# Patient Record
Sex: Female | Born: 2000
Health system: Southern US, Community
[De-identification: ages and names within clinical notes are randomized; demographics above are authoritative.]

## PROBLEM LIST (undated history)

## (undated) ENCOUNTER — Ambulatory Visit: Payer: Medicaid Other

## (undated) ENCOUNTER — Ambulatory Visit: Admission: EM

## (undated) DIAGNOSIS — J189 Pneumonia, unspecified organism: Secondary | ICD-10-CM

## (undated) DIAGNOSIS — D649 Anemia, unspecified: Secondary | ICD-10-CM

## (undated) DIAGNOSIS — R000 Tachycardia, unspecified: Secondary | ICD-10-CM

## (undated) DIAGNOSIS — F419 Anxiety disorder, unspecified: Secondary | ICD-10-CM

## (undated) DIAGNOSIS — A6000 Herpesviral infection of urogenital system, unspecified: Secondary | ICD-10-CM

## (undated) DIAGNOSIS — K219 Gastro-esophageal reflux disease without esophagitis: Secondary | ICD-10-CM

## (undated) DIAGNOSIS — Z8759 Personal history of other complications of pregnancy, childbirth and the puerperium: Secondary | ICD-10-CM

## (undated) DIAGNOSIS — F32A Depression, unspecified: Secondary | ICD-10-CM

## (undated) DIAGNOSIS — U071 COVID-19: Secondary | ICD-10-CM

## (undated) DIAGNOSIS — O09299 Supervision of pregnancy with other poor reproductive or obstetric history, unspecified trimester: Secondary | ICD-10-CM

## (undated) HISTORY — DX: Anxiety disorder, unspecified: F41.9

## (undated) HISTORY — DX: Personal history of other complications of pregnancy, childbirth and the puerperium: Z87.59

## (undated) HISTORY — DX: Gastro-esophageal reflux disease without esophagitis: K21.9

## (undated) HISTORY — DX: COVID-19: U07.1

## (undated) HISTORY — DX: Depression, unspecified: F32.A

## (undated) HISTORY — DX: Herpesviral infection of urogenital system, unspecified: A60.00

## (undated) HISTORY — DX: Pneumonia, unspecified organism: J18.9

## (undated) HISTORY — PX: NO PAST SURGERIES: SHX2092

## (undated) HISTORY — DX: Supervision of pregnancy with other poor reproductive or obstetric history, unspecified trimester: O09.299

---
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
## 2020-01-11 ENCOUNTER — Ambulatory Visit: Payer: Self-pay

## 2020-01-16 ENCOUNTER — Ambulatory Visit: Payer: Medicaid Other | Admitting: Obstetrics and Gynecology

## 2020-01-20 ENCOUNTER — Ambulatory Visit
Admission: EM | Admit: 2020-01-20 | Discharge: 2020-01-20 | Disposition: A | Discharge status: Home / Self Care | Payer: Medicaid Other | Attending: Physician Assistant | Admitting: Physician Assistant

## 2020-01-20 ENCOUNTER — Other Ambulatory Visit: Payer: Self-pay

## 2020-01-20 DIAGNOSIS — J3489 Other specified disorders of nose and nasal sinuses: Secondary | ICD-10-CM

## 2020-01-20 DIAGNOSIS — R059 Cough, unspecified: Secondary | ICD-10-CM

## 2020-01-20 DIAGNOSIS — Z1152 Encounter for screening for COVID-19: Secondary | ICD-10-CM

## 2020-01-20 MED ORDER — BENZONATATE 200 MG PO CAPS: 200.0000 mg | ORAL_CAPSULE | Freq: Three times a day (TID) | ORAL | 0 refills | Status: DC

## 2020-01-20 MED ORDER — FLUTICASONE PROPIONATE 50 MCG/ACT NA SUSP: 2.0000 | Freq: Every day | NASAL | 0 refills | Status: DC

## 2020-01-20 NOTE — Discharge Instructions (Signed)
COVID PCR testing ordered. I would like you to quarantine until testing results. Tessalon for cough. Start flonase nasal spray for nasal congestion/drainage. You can use over the counter nasal saline rinse such as neti pot for nasal congestion. Keep hydrated, your urine should be clear to pale yellow in color. Tylenol/motrin for fever and pain. Monitor for any worsening of symptoms, chest pain, shortness of breath, wheezing, swelling of the throat, go to the emergency department for further evaluation needed.  ° ° °

## 2020-01-20 NOTE — ED Provider Notes (Signed)
[facility]    CSN: [id number] Arrival date & time: 01/20/20  1528      History   Chief Complaint Chief Complaint  Patient presents with  . Cough  . Chest Pain    HPI Alexis [name] is a 19 y.o. female.   19 year old female comes in for 1 week history of URI symptoms. Cough, occasional productive cough which is mostly in the morning. Denies rhinorrhea, nasal congestion, sore throat. Denies fever, chills, body aches. Denies abdominal pain, nausea, vomiting, diarrhea. Denies shortness of breath, loss of taste/smell. States now with pain to the right ribs with cough/inspiration. Ibuprofen without relief.   Of note, patient was diagnosed with bronchitis 1 month ago and given course of azithromycin. Symptoms resolved prior to current symptom onset.  Did not need prednisone, albuterol.     Past Medical History:  Diagnosis Date  . GERD (gastroesophageal reflux disease)   . Tachycardia     Patient Active Problem List   Diagnosis Date Noted  . GERD (gastroesophageal reflux disease)     Past Surgical History:  Procedure Laterality Date  . NO PAST SURGERIES      OB History    Gravida  0   Para  0   Term  0   Preterm  0   AB  0   Living  0     SAB  0   TAB  0   Ectopic  0   Multiple  0   Live Births  0            Home Medications    Prior to Admission medications   Medication Sig Start Date End Date Taking? Authorizing Provider  cetirizine (ZYRTEC) 10 MG tablet Take 10 mg by mouth daily. 03/01/19  Yes [provider]  drospirenone-ethinyl estradiol (YAZ) 3-0.02 MG tablet Take 1 tablet by mouth daily. 10/27/19  Yes Copland, Helmut Muster B, PA-C  hydrOXYzine (ATARAX/VISTARIL) 25 MG tablet Take 1 tablet (25 mg total) by mouth 3 (three) times daily as needed for anxiety. 07/03/18  Yes Wieters, Hallie C, PA-C  benzonatate (TESSALON) 200 MG capsule Take 1 capsule (200 mg total) by mouth every 8 (eight) hours. 01/20/20   Cathie Hoops, Shantinique Picazo V, PA-C   fluticasone (FLONASE) 50 MCG/ACT nasal spray Place 2 sprays into both nostrils daily. 01/20/20   Cathie Hoops, Amaar Oshita V, PA-C  omeprazole (PRILOSEC) 40 MG capsule Take 40 mg by mouth daily. 05/11/19 10/20/19  [provider]  pantoprazole (PROTONIX) 40 MG tablet Take 40 mg by mouth daily.  01/20/20  [provider]  sertraline (ZOLOFT) 50 MG tablet Take 50 mg by mouth daily. 11/02/19 01/20/20  [provider]    Family History Family History  Problem Relation Age of Onset  . Diabetes Mellitus II Mother   . Hypertension Mother   . Hypertension Father     Social History Social History   Tobacco Use  . Smoking status: Never Smoker  . Smokeless tobacco: Never Used  Vaping Use  . Vaping Use: Never used  Substance Use Topics  . Alcohol use: Not Currently  . Drug use: Not Currently     Allergies   Dust mite extract   Review of Systems Review of Systems  Reason unable to perform ROS: See HPI as above.     Physical Exam Triage Vital Signs ED Triage Vitals  Enc Vitals Group     BP 01/20/20 1550 135/86     Pulse Rate 01/20/20 1550 99  Resp 01/20/20 1550 18     Temp 01/20/20 1550 98.8 F (37.1 C)     Temp Source 01/20/20 1550 Oral     SpO2 01/20/20 1550 99 %     Weight --      Height --      Head Circumference --      Peak Flow --      Pain Score 01/20/20 1614 4     Pain Loc --      Pain Edu? --      Excl. in GC? --    No data found.  Updated Vital Signs BP 135/86 (BP Location: Left Arm)   Pulse 99   Temp 98.8 F (37.1 C) (Oral)   Resp 18   LMP 01/03/2020   SpO2 99%   Physical Exam Constitutional:      General: She is not in acute distress.    Appearance: Normal appearance. She is well-developed. She is not ill-appearing, toxic-appearing or diaphoretic.  HENT:     Head: Normocephalic and atraumatic.     Right Ear: Tympanic membrane, ear canal and external ear normal. Tympanic membrane is not erythematous or bulging.     Left Ear: Tympanic  membrane, ear canal and external ear normal. Tympanic membrane is not erythematous or bulging.     Nose:     Right Sinus: Maxillary sinus tenderness present. No frontal sinus tenderness.     Left Sinus: Maxillary sinus tenderness present. No frontal sinus tenderness.     Mouth/Throat:     Mouth: Mucous membranes are moist.     Pharynx: Oropharynx is clear. Uvula midline.  Eyes:     Conjunctiva/sclera: Conjunctivae normal.     Pupils: Pupils are equal, round, and reactive to light.  Cardiovascular:     Rate and Rhythm: Normal rate and regular rhythm.  Pulmonary:     Effort: Pulmonary effort is normal. No accessory muscle usage, prolonged expiration, respiratory distress or retractions.     Breath sounds: No decreased air movement or transmitted upper airway sounds. No decreased breath sounds.     Comments: LCTAB Chest:     Chest wall: No tenderness.  Musculoskeletal:     Cervical back: Normal range of motion and neck supple.  Skin:    General: Skin is warm and dry.  Neurological:     Mental Status: She is alert and oriented to person, place, and time.      UC Treatments / Results  Labs (all labs ordered are listed, but only abnormal results are displayed) Labs Reviewed  NOVEL CORONAVIRUS, NAA    EKG   Radiology No results found.  Procedures Procedures (including critical care time)  Medications Ordered in UC Medications - No data to display  Initial Impression / Assessment and Plan / UC Course  I have reviewed the triage vital signs and the nursing notes.  Pertinent labs & imaging results that were available during my care of the patient were reviewed by me and considered in my medical decision making (see chart for details).    Patient afebrile, nontoxic.  Lungs clear to auscultation bilaterally without adventitious lung sounds.  And no indications for chest x-ray at this time.  Covid testing ordered.  Will treat symptomatically.  Return precautions given.   Patient expresses understanding and agrees to plan.  If symptoms not improving with symptomatic management, to consider prednisone/doxycycline.  Final Clinical Impressions(s) / UC Diagnoses   Final diagnoses:  Encounter for screening for COVID-19  Cough  Sinus pressure   ED Prescriptions    Medication Sig Dispense Auth. Provider   benzonatate (TESSALON) 200 MG capsule Take 1 capsule (200 mg total) by mouth every 8 (eight) hours. 21 capsule Niomie Englert V, PA-C   fluticasone (FLONASE) 50 MCG/ACT nasal spray Place 2 sprays into both nostrils daily. 1 g Belinda Fisher, PA-C     PDMP not reviewed this encounter.   Belinda Fisher, PA-C 01/20/20 1641

## 2020-01-20 NOTE — ED Triage Notes (Signed)
Pt c/o productive cough with yellow sputum for approx 1 week. Pt c/o right sided rib/chest pain today that is exacerbated with moving, inspiration.  Denies SOB, sore throat, fever, chills, n/v/d. Denies OTC meds for symptoms Pt states she was evaluated and dx for bronchitis approx 1 month ago and given antibiotic. Bilat lungs CTA

## 2020-01-22 LAB — SARS-COV-2, NAA 2 DAY TAT

## 2020-01-22 LAB — NOVEL CORONAVIRUS, NAA: SARS-CoV-2, NAA: NOT DETECTED

## 2020-02-09 ENCOUNTER — Other Ambulatory Visit: Payer: Self-pay

## 2020-02-09 ENCOUNTER — Ambulatory Visit (INDEPENDENT_AMBULATORY_CARE_PROVIDER_SITE_OTHER): Payer: Medicaid Other | Admitting: Obstetrics and Gynecology

## 2020-02-09 ENCOUNTER — Other Ambulatory Visit (HOSPITAL_COMMUNITY)
Admission: RE | Admit: 2020-02-09 | Discharge: 2020-02-09 | Disposition: A | Discharge status: Home / Self Care | Payer: Medicaid Other | Source: Ambulatory Visit | Attending: Obstetrics and Gynecology | Admitting: Obstetrics and Gynecology

## 2020-02-09 ENCOUNTER — Encounter: Payer: Self-pay | Admitting: Obstetrics and Gynecology

## 2020-02-09 VITALS — BP 120/70 | HR 89 | Resp 16 | Ht 65.0 in | Wt 143.6 lb

## 2020-02-09 DIAGNOSIS — Z113 Encounter for screening for infections with a predominantly sexual mode of transmission: Secondary | ICD-10-CM | POA: Diagnosis not present

## 2020-02-09 DIAGNOSIS — B9689 Other specified bacterial agents as the cause of diseases classified elsewhere: Secondary | ICD-10-CM | POA: Insufficient documentation

## 2020-02-09 DIAGNOSIS — N760 Acute vaginitis: Secondary | ICD-10-CM | POA: Insufficient documentation

## 2020-02-09 LAB — POCT WET PREP WITH KOH
KOH Prep POC: NEGATIVE
Trichomonas, UA: NEGATIVE
Yeast Wet Prep HPF POC: NEGATIVE

## 2020-02-09 MED ORDER — CLINDAMYCIN HCL 300 MG PO CAPS: 300.0000 mg | ORAL_CAPSULE | Freq: Two times a day (BID) | ORAL | 0 refills | Status: AC

## 2020-02-09 NOTE — Patient Instructions (Signed)
I value your feedback and entrusting us with your care. If you get a [facility] patient survey, I would appreciate you taking the time to let us know about your experience today. Thank you!  As of May 26, 2019, your lab results will be released to your MyChart immediately, before I even have a chance to see them. Please give me time to review them and contact you if there are any abnormalities. Thank you for your patience.  

## 2020-02-09 NOTE — Progress Notes (Signed)
Alexis Downs, MD   Chief Complaint  Patient presents with  . Gynecologic Exam    STD screening  ( discharge and vaginal bump x 1 week )    HPI:      Alexis [name] is a 19 y.o. G0P0000 whose LMP was Patient's last menstrual period was 01/31/2020., presents today for increased d/c with odor, irritation this wk. Feels like BV sx from 7/21, confirmed on culture, treated with clindamycin. Pt uses scented soap, dryer sheets, and is taking probiotics. Is not using condoms, no new partners. Would like STD testing just to be safe. Neg STD testing 7/21 Also noticed a vaginal bump RT labia majora this wk. Sx occur after shaving.    Past Medical History:  Diagnosis Date  . GERD (gastroesophageal reflux disease)   . Tachycardia     Past Surgical History:  Procedure Laterality Date  . NO PAST SURGERIES      Family History  Problem Relation Age of Onset  . Diabetes Mellitus II Mother   . Hypertension Mother   . Hypertension Father     Social History   Socioeconomic History  . Marital status: Unknown    Spouse name: Not on file  . Number of children: Not on file  . Years of education: Not on file  . Highest education level: Not on file  Occupational History  . Not on file  Tobacco Use  . Smoking status: Never Smoker  . Smokeless tobacco: Never Used  Vaping Use  . Vaping Use: Never used  Substance and Sexual Activity  . Alcohol use: Not Currently  . Drug use: Not Currently  . Sexual activity: Yes    Birth control/protection: Pill  Other Topics Concern  . Not on file  Social History Narrative  . Not on file   Social Determinants of Health   Financial Resource Strain:   . Difficulty of Paying Living Expenses: Not on file  Food Insecurity:   . Worried About Programme researcher, broadcasting/film/video in the Last Year: Not on file  . Ran Out of Food in the Last Year: Not on file  Transportation Needs:   . Lack of Transportation (Medical): Not on file  . Lack of Transportation  (Non-Medical): Not on file  Physical Activity:   . Days of Exercise per Week: Not on file  . Minutes of Exercise per Session: Not on file  Stress:   . Feeling of Stress : Not on file  Social Connections:   . Frequency of Communication with Friends and Family: Not on file  . Frequency of Social Gatherings with Friends and Family: Not on file  . Attends Religious Services: Not on file  . Active Member of Clubs or Organizations: Not on file  . Attends Banker Meetings: Not on file  . Marital Status: Not on file  Intimate Partner Violence:   . Fear of Current or Ex-Partner: Not on file  . Emotionally Abused: Not on file  . Physically Abused: Not on file  . Sexually Abused: Not on file    Outpatient Medications Prior to Visit  Medication Sig Dispense Refill  . cetirizine (ZYRTEC) 10 MG tablet Take 10 mg by mouth daily.    . fluticasone (FLONASE) 50 MCG/ACT nasal spray Place 2 sprays into both nostrils daily. 1 g 0  . hydrOXYzine (ATARAX/VISTARIL) 25 MG tablet Take 1 tablet (25 mg total) by mouth 3 (three) times daily as needed for anxiety. 30 tablet 0  .  benzonatate (TESSALON) 200 MG capsule Take 1 capsule (200 mg total) by mouth every 8 (eight) hours. 21 capsule 0  . drospirenone-ethinyl estradiol (YAZ) 3-0.02 MG tablet Take 1 tablet by mouth daily. 3 Package 2   No facility-administered medications prior to visit.      ROS:  Review of Systems  Constitutional: Negative for fever.  Gastrointestinal: Negative for blood in stool, constipation, diarrhea, nausea and vomiting.  Genitourinary: Positive for vaginal discharge. Negative for dyspareunia, dysuria, flank pain, frequency, hematuria, urgency, vaginal bleeding and vaginal pain.  Musculoskeletal: Negative for back pain.  Skin: Negative for rash.  BREAST: No symptoms   OBJECTIVE:   Vitals:  BP 120/70   Pulse 89   Resp 16   Ht 5\' 5"  (1.651 m)   Wt 143 lb 9.6 oz (65.1 kg)   LMP 01/31/2020   SpO2 99%   BMI  23.90 kg/m   Physical Exam Vitals reviewed.  Constitutional:      Appearance: She is well-developed.  Pulmonary:     Effort: Pulmonary effort is normal.  Genitourinary:    General: Normal vulva.     Pubic Area: No rash.      Labia:        Right: Lesion present. No rash or tenderness.        Left: No rash, tenderness or lesion.      Vagina: Vaginal discharge present. No erythema or tenderness.     Cervix: Normal.     Uterus: Normal. Not enlarged and not tender.      Adnexa: Right adnexa normal and left adnexa normal.       Right: No mass or tenderness.         Left: No mass or tenderness.      Musculoskeletal:        General: Normal range of motion.     Cervical back: Normal range of motion.  Skin:    General: Skin is warm and dry.  Neurological:     General: No focal deficit present.     Mental Status: She is alert and oriented to person, place, and time.  Psychiatric:        Mood and Affect: Mood normal.        Behavior: Behavior normal.        Thought Content: Thought content normal.        Judgment: Judgment normal.     Results: Results for orders placed or performed in visit on 02/09/20 (from the past 24 hour(s))  POCT Wet Prep with KOH     Status: Normal   Collection Time: 02/09/20 12:00 PM  Result Value Ref Range   Trichomonas, UA Negative    Clue Cells Wet Prep HPF POC few    Epithelial Wet Prep HPF POC     Yeast Wet Prep HPF POC neg    Bacteria Wet Prep HPF POC     RBC Wet Prep HPF POC     WBC Wet Prep HPF POC     KOH Prep POC Negative Negative     Assessment/Plan: Bacterial vaginosis - Plan: clindamycin (CLEOCIN) 300 MG capsule, Cervicovaginal ancillary only, POCT Wet Prep with KOH; Pos sx and exam. Rx clindamycin. Dove sens skin soap, line dry underwear, condoms. May need to add boric acid supp. Cont probiotics.  Screening for STD (sexually transmitted disease) - Plan: Cervicovaginal ancillary only    Meds ordered this encounter  Medications    . clindamycin (CLEOCIN) 300 MG capsule  Sig: Take 1 capsule (300 mg total) by mouth 2 (two) times daily for 7 days.    Dispense:  14 capsule    Refill:  0    Order Specific Question:   Supervising Provider    Answer:   Nadara Mustard [[id number]]      Return if symptoms worsen or fail to improve.  Arturo Freundlich B. Estrella Alcaraz, PA-C 02/09/2020 12:01 PM

## 2020-02-13 LAB — CERVICOVAGINAL ANCILLARY ONLY
Bacterial Vaginitis (gardnerella): NEGATIVE
Chlamydia: NEGATIVE
Comment: NEGATIVE
Comment: NEGATIVE
Comment: NEGATIVE
Comment: NORMAL
Neisseria Gonorrhea: NEGATIVE
Trichomonas: NEGATIVE

## 2020-02-14 MED ORDER — FLUCONAZOLE 150 MG PO TABS: 150.0000 mg | ORAL_TABLET | Freq: Once | ORAL | 0 refills | Status: AC

## 2020-02-14 NOTE — Addendum Note (Signed)
Addended by: Althea Grimmer B on: 02/14/2020 09:43 AM   Modules accepted: Orders

## 2020-02-17 ENCOUNTER — Ambulatory Visit
Admission: EM | Admit: 2020-02-17 | Discharge: 2020-02-17 | Disposition: A | Discharge status: Home / Self Care | Payer: Medicaid Other | Attending: Physician Assistant | Admitting: Physician Assistant

## 2020-02-17 ENCOUNTER — Other Ambulatory Visit: Payer: Self-pay

## 2020-02-17 DIAGNOSIS — J029 Acute pharyngitis, unspecified: Secondary | ICD-10-CM | POA: Diagnosis present

## 2020-02-17 DIAGNOSIS — R05 Cough: Secondary | ICD-10-CM | POA: Diagnosis present

## 2020-02-17 DIAGNOSIS — R0981 Nasal congestion: Secondary | ICD-10-CM | POA: Diagnosis present

## 2020-02-17 DIAGNOSIS — Z1152 Encounter for screening for COVID-19: Secondary | ICD-10-CM | POA: Insufficient documentation

## 2020-02-17 DIAGNOSIS — R059 Cough, unspecified: Secondary | ICD-10-CM

## 2020-02-17 LAB — POCT RAPID STREP A (OFFICE): Rapid Strep A Screen: NEGATIVE

## 2020-02-17 NOTE — ED Provider Notes (Signed)
[facility]    CSN: [id number] Arrival date & time: 02/17/20  1043      History   Chief Complaint Chief Complaint  Patient presents with  . Sore Throat  . Fever    HPI Alexis [name] is a 19 y.o. female.   19 year old female comes in for 2 day of URI symptoms. Sore throat, nasal congestion, rhinorrhea, cough, nausea, body aches. Fever, tmax 102.9, responsive to antipyretic. Denies abdominal pain, nausea, vomiting, diarrhea. Denies shortness of breath, loss of taste/smell.      Past Medical History:  Diagnosis Date  . GERD (gastroesophageal reflux disease)   . Tachycardia     Patient Active Problem List   Diagnosis Date Noted  . GERD (gastroesophageal reflux disease)     Past Surgical History:  Procedure Laterality Date  . NO PAST SURGERIES      OB History    Gravida  0   Para  0   Term  0   Preterm  0   AB  0   Living  0     SAB  0   TAB  0   Ectopic  0   Multiple  0   Live Births  0            Home Medications    Prior to Admission medications   Medication Sig Start Date End Date Taking? Authorizing Provider  cetirizine (ZYRTEC) 10 MG tablet Take 10 mg by mouth daily. 03/01/19  Yes [provider]  fluticasone (FLONASE) 50 MCG/ACT nasal spray Place 2 sprays into both nostrils daily. 01/20/20  Yes Dula Havlik V, PA-C  pantoprazole (PROTONIX) 40 MG tablet Take 40 mg by mouth daily.   Yes [provider]  hydrOXYzine (ATARAX/VISTARIL) 25 MG tablet Take 1 tablet (25 mg total) by mouth 3 (three) times daily as needed for anxiety. 07/03/18   Wieters, Hallie C, PA-C  omeprazole (PRILOSEC) 40 MG capsule Take 40 mg by mouth daily. 05/11/19 10/20/19  [provider]  sertraline (ZOLOFT) 50 MG tablet Take 50 mg by mouth daily. 11/02/19 01/20/20  [provider]    Family History Family History  Problem Relation Age of Onset  . Diabetes Mellitus II Mother   . Hypertension Mother   . Hypertension Father      Social History Social History   Tobacco Use  . Smoking status: Never Smoker  . Smokeless tobacco: Never Used  Vaping Use  . Vaping Use: Never used  Substance Use Topics  . Alcohol use: Not Currently  . Drug use: Not Currently     Allergies   Dust mite extract   Review of Systems Review of Systems  Reason unable to perform ROS: See HPI as above.     Physical Exam Triage Vital Signs ED Triage Vitals  Enc Vitals Group     BP 02/17/20 1248 128/87     Pulse Rate 02/17/20 1248 (!) 113     Resp 02/17/20 1248 16     Temp 02/17/20 1248 98.9 F (37.2 C)     Temp src --      SpO2 02/17/20 1248 99 %     Weight --      Height --      Head Circumference --      Peak Flow --      Pain Score 02/17/20 1320 7     Pain Loc --      Pain Edu? --  Excl. in GC? --    No data found.  Updated Vital Signs BP 128/87 (BP Location: Left Arm)   Pulse (!) 113   Temp 98.9 F (37.2 C) Comment: ibuprophin x 2 hours ago  Resp 16   LMP 01/31/2020   SpO2 99%   Physical Exam Constitutional:      General: She is not in acute distress.    Appearance: Normal appearance. She is well-developed. She is not ill-appearing, toxic-appearing or diaphoretic.  HENT:     Head: Normocephalic and atraumatic.     Right Ear: Tympanic membrane, ear canal and external ear normal. Tympanic membrane is not erythematous or bulging.     Left Ear: Tympanic membrane, ear canal and external ear normal. Tympanic membrane is not erythematous or bulging.     Nose:     Right Sinus: No maxillary sinus tenderness or frontal sinus tenderness.     Left Sinus: No maxillary sinus tenderness or frontal sinus tenderness.     Mouth/Throat:     Mouth: Mucous membranes are moist.     Pharynx: Oropharynx is clear. Uvula midline.  Eyes:     Conjunctiva/sclera: Conjunctivae normal.     Pupils: Pupils are equal, round, and reactive to light.  Cardiovascular:     Rate and Rhythm: Regular rhythm. Tachycardia present.   Pulmonary:     Effort: Pulmonary effort is normal. No accessory muscle usage, prolonged expiration, respiratory distress or retractions.     Breath sounds: No decreased air movement or transmitted upper airway sounds. No decreased breath sounds.     Comments: LCTAB Musculoskeletal:     Cervical back: Normal range of motion and neck supple.  Skin:    General: Skin is warm and dry.  Neurological:     Mental Status: She is alert and oriented to person, place, and time.      UC Treatments / Results  Labs (all labs ordered are listed, but only abnormal results are displayed) Labs Reviewed  NOVEL CORONAVIRUS, NAA  CULTURE, GROUP A STREP [facility])  POCT RAPID STREP A (OFFICE)    EKG   Radiology No results found.  Procedures Procedures (including critical care time)  Medications Ordered in UC Medications - No data to display  Initial Impression / Assessment and Plan / UC Course  I have reviewed the triage vital signs and the nursing notes.  Pertinent labs & imaging results that were available during my care of the patient were reviewed by me and considered in my medical decision making (see chart for details).    Rapid strep negative. COVID PCR test ordered. Patient to quarantine until testing results return. No alarming signs on exam. LCTAB. Symptomatic treatment discussed.  Push fluids.  Return precautions given.  Patient expresses understanding and agrees to plan.  Final Clinical Impressions(s) / UC Diagnoses   Final diagnoses:  Encounter for screening for COVID-19  Cough  Sore throat  Nasal congestion   ED Prescriptions    None     PDMP not reviewed this encounter.   Belinda Fisher, PA-C 02/17/20 1344

## 2020-02-17 NOTE — ED Triage Notes (Signed)
Pt c/o sore throat, congestion, runny nose, nausea, body aches, cough, fever, right ear pain onset approx 2 days ago. Reports Tmax of 102.9 this morning. Took 600mg  ibuprofen at approx 0930. Took alka seltzer cold last night.   Denies v/d, abdominal pain, SOB,loss of taste/smell.

## 2020-02-17 NOTE — Discharge Instructions (Signed)
Rapid strep negative. COVID PCR testing ordered. I would like you to quarantine until testing results. You can take over the counter flonase/nasacort to help with nasal congestion/drainage. Tylenol/motrin for pain and fever. Keep hydrated, urine should be clear to pale yellow in color. If experiencing shortness of breath, trouble breathing, go to the emergency department for further evaluation needed.   For sore throat/cough try using a honey-based tea. Use 3 teaspoons of honey with juice squeezed from half lemon. Place shaved pieces of ginger into 1/2-1 cup of water and warm over stove top. Then mix the ingredients and repeat every 4 hours as needed.

## 2020-02-18 LAB — NOVEL CORONAVIRUS, NAA: SARS-CoV-2, NAA: NOT DETECTED

## 2020-02-19 LAB — CULTURE, GROUP A STREP (THRC)

## 2020-03-13 ENCOUNTER — Ambulatory Visit: Payer: Medicaid Other | Admitting: Obstetrics and Gynecology

## 2020-03-14 NOTE — Progress Notes (Signed)
Patient, No Pcp Per   Chief Complaint  Patient presents with  . Vaginal Discharge    thick, itchiness, sour odor x 1 week    HPI:      [name] is a 19 y.o. G0P0000 whose LMP was Patient's last menstrual period was 02/22/2020 (exact date)., presents today for increased vag d/c with irritation and sour odor for the past wk. Treated with monistat-7 with sx relief except still has sour odor. Hx of BV 8/21, treated with clindamycin with sx relief. Taking probiotics, no dryer sheets, using dove sens skin soap. She is sex active, no new partners. Neg STD tesitng 8/21. No urin sx, pelvic pain, fevers.  She would like to restart OCPs. Has done sprintec with rash, loestrin with longer periods and yaz with mood changes. Not on OCPs currently. Also considering depo.  Past Medical History:  Diagnosis Date  . GERD (gastroesophageal reflux disease)   . Tachycardia     Past Surgical History:  Procedure Laterality Date  . NO PAST SURGERIES      Family History  Problem Relation Age of Onset  . Diabetes Mellitus II Mother   . Hypertension Mother   . Hypertension Father     Social History   Socioeconomic History  . Marital status: Unknown    Spouse name: Not on file  . Number of children: Not on file  . Years of education: Not on file  . Highest education level: Not on file  Occupational History  . Not on file  Tobacco Use  . Smoking status: Never Smoker  . Smokeless tobacco: Never Used  Vaping Use  . Vaping Use: Never used  Substance and Sexual Activity  . Alcohol use: Not Currently  . Drug use: Not Currently  . Sexual activity: Yes    Birth control/protection: None, Condom  Other Topics Concern  . Not on file  Social History Narrative  . Not on file   Social Determinants of Health   Financial Resource Strain:   . Difficulty of Paying Living Expenses: Not on file  Food Insecurity:   . Worried About Programme researcher, broadcasting/film/video in the Last Year: Not on file  .  Ran Out of Food in the Last Year: Not on file  Transportation Needs:   . Lack of Transportation (Medical): Not on file  . Lack of Transportation (Non-Medical): Not on file  Physical Activity:   . Days of Exercise per Week: Not on file  . Minutes of Exercise per Session: Not on file  Stress:   . Feeling of Stress : Not on file  Social Connections:   . Frequency of Communication with Friends and Family: Not on file  . Frequency of Social Gatherings with Friends and Family: Not on file  . Attends Religious Services: Not on file  . Active Member of Clubs or Organizations: Not on file  . Attends Banker Meetings: Not on file  . Marital Status: Not on file  Intimate Partner Violence:   . Fear of Current or Ex-Partner: Not on file  . Emotionally Abused: Not on file  . Physically Abused: Not on file  . Sexually Abused: Not on file    Outpatient Medications Prior to Visit  Medication Sig Dispense Refill  . cetirizine (ZYRTEC) 10 MG tablet Take 10 mg by mouth daily.    . hydrOXYzine (ATARAX/VISTARIL) 25 MG tablet Take 1 tablet (25 mg total) by mouth 3 (three) times daily as needed for anxiety.  30 tablet 0  . pantoprazole (PROTONIX) 40 MG tablet Take 40 mg by mouth daily.    . fluticasone (FLONASE) 50 MCG/ACT nasal spray Place 2 sprays into both nostrils daily. 1 g 0   No facility-administered medications prior to visit.      ROS:  Review of Systems  Constitutional: Negative for fever.  Gastrointestinal: Negative for blood in stool, constipation, diarrhea, nausea and vomiting.  Genitourinary: Negative for dyspareunia, dysuria, flank pain, frequency, hematuria, urgency, vaginal bleeding, vaginal discharge and vaginal pain.  Musculoskeletal: Negative for back pain.  Skin: Negative for rash.  BREAST: No symptoms   OBJECTIVE:   Vitals:  BP 110/70   Ht 5\' 5"  (1.651 m)   Wt 144 lb (65.3 kg)   LMP 02/22/2020 (Exact Date)   BMI 23.96 kg/m   Physical Exam Vitals  reviewed.  Constitutional:      Appearance: She is well-developed.  Pulmonary:     Effort: Pulmonary effort is normal.  Genitourinary:    General: Normal vulva.     Pubic Area: No rash.      Labia:        Right: No rash, tenderness or lesion.        Left: No rash, tenderness or lesion.      Vagina: Normal. No vaginal discharge, erythema or tenderness.     Cervix: Normal.     Uterus: Normal. Not enlarged and not tender.      Adnexa: Right adnexa normal and left adnexa normal.       Right: No mass or tenderness.         Left: No mass or tenderness.    Musculoskeletal:        General: Normal range of motion.     Cervical back: Normal range of motion.  Skin:    General: Skin is warm and dry.  Neurological:     General: No focal deficit present.     Mental Status: She is alert and oriented to person, place, and time.  Psychiatric:        Mood and Affect: Mood normal.        Behavior: Behavior normal.        Thought Content: Thought content normal.        Judgment: Judgment normal.     Results: Results for orders placed or performed in visit on 03/15/20 (from the past 24 hour(s))  POCT Wet Prep with KOH     Status: Normal   Collection Time: 03/15/20 10:40 AM  Result Value Ref Range   Trichomonas, UA Negative    Clue Cells Wet Prep HPF POC neg    Epithelial Wet Prep HPF POC     Yeast Wet Prep HPF POC neg    Bacteria Wet Prep HPF POC     RBC Wet Prep HPF POC     WBC Wet Prep HPF POC     KOH Prep POC Negative Negative     Assessment/Plan: Vaginal odor - Plan: fluconazole (DIFLUCAN) 150 MG tablet, POCT Wet Prep with KOH; Neg wet prep and sx for BV, neg wet prep. Treat empirically with diflucan in case any residual yeast. Reassurance. F/u prn.   Encounter for initial prescription of contraceptive pills - Plan: levonorgestrel-ethinyl estradiol (AVIANE) 0.1-20 MG-MCG tablet; try Aviane. Start with next menses. Condoms. F/u prn side effects. Depo also discussed.    Meds  ordered this encounter  Medications  . fluconazole (DIFLUCAN) 150 MG tablet    Sig: Take 1 tablet (  150 mg total) by mouth once for 1 dose.    Dispense:  1 tablet    Refill:  0    Order Specific Question:   Supervising Provider    Answer:   Nadara Mustard [id number]  . levonorgestrel-ethinyl estradiol (AVIANE) 0.1-20 MG-MCG tablet    Sig: Take 1 tablet by mouth daily.    Dispense:  84 tablet    Refill:  0    Order Specific Question:   Supervising Provider    Answer:   Nadara Mustard [[id number]]      Return if symptoms worsen or fail to improve.  Alexis [name] B. Dawnell Bryant, PA-C 03/15/2020 10:42 AM

## 2020-03-15 ENCOUNTER — Encounter: Payer: Self-pay | Admitting: Obstetrics and Gynecology

## 2020-03-15 ENCOUNTER — Ambulatory Visit (INDEPENDENT_AMBULATORY_CARE_PROVIDER_SITE_OTHER): Payer: Medicaid Other | Admitting: Obstetrics and Gynecology

## 2020-03-15 ENCOUNTER — Other Ambulatory Visit: Payer: Self-pay

## 2020-03-15 VITALS — BP 110/70 | Ht 65.0 in | Wt 144.0 lb

## 2020-03-15 DIAGNOSIS — Z30011 Encounter for initial prescription of contraceptive pills: Secondary | ICD-10-CM

## 2020-03-15 DIAGNOSIS — N898 Other specified noninflammatory disorders of vagina: Secondary | ICD-10-CM | POA: Diagnosis not present

## 2020-03-15 LAB — POCT WET PREP WITH KOH
Clue Cells Wet Prep HPF POC: NEGATIVE
KOH Prep POC: NEGATIVE
Trichomonas, UA: NEGATIVE
Yeast Wet Prep HPF POC: NEGATIVE

## 2020-03-15 MED ORDER — LEVONORGESTREL-ETHINYL ESTRAD 0.1-20 MG-MCG PO TABS: 1.0000 | ORAL_TABLET | Freq: Every day | ORAL | 0 refills | Status: DC

## 2020-03-15 MED ORDER — FLUCONAZOLE 150 MG PO TABS: 150.0000 mg | ORAL_TABLET | Freq: Once | ORAL | 0 refills | Status: AC

## 2020-03-15 NOTE — Patient Instructions (Signed)
I value your feedback and entrusting us with your care. If you get a [facility] patient survey, I would appreciate you taking the time to let us know about your experience today. Thank you!  As of May 26, 2019, your lab results will be released to your MyChart immediately, before I even have a chance to see them. Please give me time to review them and contact you if there are any abnormalities. Thank you for your patience.  

## 2020-03-28 ENCOUNTER — Encounter: Payer: Self-pay | Admitting: Advanced Practice Midwife

## 2020-03-28 ENCOUNTER — Ambulatory Visit (INDEPENDENT_AMBULATORY_CARE_PROVIDER_SITE_OTHER): Payer: Medicaid Other | Admitting: Advanced Practice Midwife

## 2020-03-28 ENCOUNTER — Other Ambulatory Visit (HOSPITAL_COMMUNITY)
Admission: RE | Admit: 2020-03-28 | Discharge: 2020-03-28 | Disposition: A | Discharge status: Home / Self Care | Payer: Medicaid Other | Source: Ambulatory Visit | Attending: Advanced Practice Midwife | Admitting: Advanced Practice Midwife

## 2020-03-28 ENCOUNTER — Other Ambulatory Visit: Payer: Self-pay

## 2020-03-28 VITALS — BP 129/76 | HR 98 | Wt 142.0 lb

## 2020-03-28 DIAGNOSIS — Z113 Encounter for screening for infections with a predominantly sexual mode of transmission: Secondary | ICD-10-CM | POA: Insufficient documentation

## 2020-03-28 DIAGNOSIS — N912 Amenorrhea, unspecified: Secondary | ICD-10-CM

## 2020-03-28 DIAGNOSIS — N898 Other specified noninflammatory disorders of vagina: Secondary | ICD-10-CM

## 2020-03-28 DIAGNOSIS — R102 Pelvic and perineal pain: Secondary | ICD-10-CM

## 2020-03-28 DIAGNOSIS — R350 Frequency of micturition: Secondary | ICD-10-CM

## 2020-03-28 LAB — POCT URINALYSIS DIPSTICK
Glucose, UA: NEGATIVE
Leukocytes, UA: NEGATIVE
Protein, UA: NEGATIVE
Spec Grav, UA: 1.015 (ref 1.010–1.025)
Urobilinogen, UA: NEGATIVE E.U./dL — AB
pH, UA: 6.5 (ref 5.0–8.0)

## 2020-03-28 NOTE — Progress Notes (Signed)
Lower back pain and at night travels up the side feels like a pulling/cramp, lower abdominal cramping, white discharge no odor, frequent urination, after urinating cramping.

## 2020-03-28 NOTE — Progress Notes (Signed)
Patient ID: Alexis [name], female   DOB: 2000/08/22, 19 y.o.   MRN: [id number]  Reason for Consult: pelvic pain, back pain, vaginal discharge, urinary frequency/urgency    Subjective:  HPI:   Alexis [name] is a 19 y.o. female who had a positive home pregnancy test 5 days ago. Since then she has had severe pelvic cramping and bilateral lower abdomen and bilateral low back pain that radiates up and down. She describes the pain as constant and cramping. She denies any recent trauma. She denies bleeding. She mentions an increase in vaginal discharge that is white and without odor. She has had urinary frequency and urgency. She has tried a heating pad for the pain. She has not taken any pain medicine. She is concerned about a possible ectopic pregnancy and a possible UTI. She does not have concerns for STDs.  She is scheduled for a NOB visit soon. We discussed doing STD testing today since we are already doing vaginitis swab. She agrees.   Past Medical History:  Diagnosis Date  . GERD (gastroesophageal reflux disease)   . Tachycardia    Family History  Problem Relation Age of Onset  . Diabetes Mellitus II Mother   . Hypertension Mother   . Hypertension Father   . Stomach cancer Maternal Aunt   . Stomach cancer Paternal Grandmother    Past Surgical History:  Procedure Laterality Date  . NO PAST SURGERIES      Short Social History:  Social History   Tobacco Use  . Smoking status: Never Smoker  . Smokeless tobacco: Never Used  Substance Use Topics  . Alcohol use: Not Currently    Allergies  Allergen Reactions  . Dust Mite Extract Hives    No current outpatient medications on file.   No current facility-administered medications for this visit.    Review of Systems  Constitutional: Negative for chills and fever.  HENT: Negative for congestion, ear discharge, ear pain, hearing loss, sinus pain and sore throat.   Eyes: Negative for blurred vision and double vision.   Respiratory: Negative for cough, shortness of breath and wheezing.   Cardiovascular: Negative for chest pain, palpitations and leg swelling.  Gastrointestinal: Negative for abdominal pain, blood in stool, constipation, diarrhea, heartburn, melena, nausea and vomiting.  Genitourinary: Negative for dysuria, flank pain, frequency, hematuria and urgency.       Positive for pelvic pain, vaginal discharge  Musculoskeletal: Positive for back pain. Negative for joint pain and myalgias.  Skin: Negative for itching and rash.  Neurological: Negative for dizziness, tingling, tremors, sensory change, speech change, focal weakness, seizures, loss of consciousness, weakness and headaches.  Endo/Heme/Allergies: Negative for environmental allergies. Does not bruise/bleed easily.  Psychiatric/Behavioral: Negative for depression, hallucinations, memory loss, substance abuse and suicidal ideas. The patient is not nervous/anxious and does not have insomnia.         Objective:  Objective   Vitals:   03/28/20 1351  BP: 129/76  Pulse: 98  Weight: 142 lb (64.4 kg)   Body mass index is 23.63 kg/m. Constitutional: Well nourished, well developed female in no acute distress.  HEENT: normal Skin: Warm and dry.  Cardiovascular: Regular rate and rhythm.   Extremity: no edema   Respiratory: Clear to auscultation bilateral. Normal respiratory effort Abdomen: mildly tender to palpation Back: no CVAT Neuro: DTRs 2+, Cranial nerves grossly intact Psych: Alert and Oriented x3. No memory deficits. Normal mood and affect.    Pelvic exam:  is not limited by body habitus EGBUS:  within normal limits Vagina: within normal limits and with normal mucosa, scant white discharge on swab Cervix: not evaluated   Assessment/Plan:     19 y.o. G0 P0 with positive home pregnancy test, pelvic pain, back pain   Trans vaginal ultrasound to r/o ectopic pregnancy and f/u after Urine culture Aptima: STD/vaginitis Return  to clinic for scheduled NOB visit   Tresea Mall CNM [facility] 03/28/2020, 4:08 PM

## 2020-03-29 ENCOUNTER — Other Ambulatory Visit (INDEPENDENT_AMBULATORY_CARE_PROVIDER_SITE_OTHER): Payer: Medicaid Other

## 2020-03-29 ENCOUNTER — Other Ambulatory Visit: Payer: Self-pay | Admitting: Advanced Practice Midwife

## 2020-03-29 DIAGNOSIS — Z3491 Encounter for supervision of normal pregnancy, unspecified, first trimester: Secondary | ICD-10-CM

## 2020-03-29 DIAGNOSIS — R102 Pelvic and perineal pain: Secondary | ICD-10-CM

## 2020-03-30 LAB — CERVICOVAGINAL ANCILLARY ONLY
Bacterial Vaginitis (gardnerella): NEGATIVE
Candida Glabrata: NEGATIVE
Candida Vaginitis: NEGATIVE
Chlamydia: NEGATIVE
Comment: NEGATIVE
Comment: NEGATIVE
Comment: NEGATIVE
Comment: NEGATIVE
Comment: NEGATIVE
Comment: NORMAL
Neisseria Gonorrhea: NEGATIVE
Trichomonas: NEGATIVE

## 2020-03-31 LAB — URINE CULTURE

## 2020-04-02 ENCOUNTER — Telehealth: Payer: Self-pay

## 2020-04-02 ENCOUNTER — Other Ambulatory Visit: Payer: Self-pay | Admitting: Advanced Practice Midwife

## 2020-04-02 DIAGNOSIS — O219 Vomiting of pregnancy, unspecified: Secondary | ICD-10-CM

## 2020-04-02 MED ORDER — PROMETHAZINE HCL 12.5 MG RE SUPP: 12.5000 mg | Freq: Four times a day (QID) | RECTAL | 1 refills | Status: DC | PRN

## 2020-04-02 NOTE — Telephone Encounter (Signed)
Just sent phenergan suppository Rx

## 2020-04-02 NOTE — Telephone Encounter (Signed)
Pt aware.

## 2020-04-02 NOTE — Progress Notes (Signed)
Rx phenergan suppository sent for n/v.

## 2020-04-02 NOTE — Telephone Encounter (Signed)
Pt requesting an RX for severe nausea. Can you send something in for her please?

## 2020-04-05 ENCOUNTER — Telehealth: Payer: Self-pay

## 2020-04-05 NOTE — Telephone Encounter (Signed)
Pt calling; is very nauseated; doesn't like rx; read about vitamin B6 and Unisom.  Would like the correct dose.  [phone]  Pt adv vitamin B6 25mg  tid c meals, unisom 25-50mg  at HS - make take more times during the day but start at [facility] as it will make drowsy per SDJ.  Also pt asked about pantoprozole for acid reflux; adv okay; should avoid fried, fatty, or spicy food; eat something nutritious every 3hrs; 6 small meals as opposed to 3 larger meals; have head of bed elevated 4-6 inches; lie on right side as stomach empties out on the right side; drink 1/4 cup milk before bedtime.  Pt aware to be seen if she doesn't keep liquids down for 24hrs.

## 2020-04-09 ENCOUNTER — Encounter: Payer: Self-pay | Admitting: Obstetrics & Gynecology

## 2020-04-09 ENCOUNTER — Ambulatory Visit (INDEPENDENT_AMBULATORY_CARE_PROVIDER_SITE_OTHER): Payer: Medicaid Other | Admitting: Obstetrics & Gynecology

## 2020-04-09 ENCOUNTER — Other Ambulatory Visit: Payer: Self-pay

## 2020-04-09 VITALS — BP 120/80 | Wt 135.0 lb

## 2020-04-09 DIAGNOSIS — Z369 Encounter for antenatal screening, unspecified: Secondary | ICD-10-CM | POA: Diagnosis not present

## 2020-04-09 DIAGNOSIS — O0991 Supervision of high risk pregnancy, unspecified, first trimester: Secondary | ICD-10-CM

## 2020-04-09 DIAGNOSIS — O30041 Twin pregnancy, dichorionic/diamniotic, first trimester: Secondary | ICD-10-CM | POA: Insufficient documentation

## 2020-04-09 DIAGNOSIS — O0990 Supervision of high risk pregnancy, unspecified, unspecified trimester: Secondary | ICD-10-CM | POA: Insufficient documentation

## 2020-04-09 DIAGNOSIS — Z3A01 Less than 8 weeks gestation of pregnancy: Secondary | ICD-10-CM

## 2020-04-09 LAB — POCT URINE PREGNANCY: Preg Test, Ur: POSITIVE — AB

## 2020-04-09 MED ORDER — DOXYLAMINE-PYRIDOXINE 10-10 MG PO TBEC
2.0000 | DELAYED_RELEASE_TABLET | Freq: Every day | ORAL | 5 refills | Status: DC
Start: 2020-04-09 — End: 2020-08-13

## 2020-04-09 NOTE — Patient Instructions (Signed)

## 2020-04-09 NOTE — Progress Notes (Signed)
04/09/2020   Chief Complaint: Missed period  Transfer of Care Patient: no  History of Present Illness: Alexis [name] is a 19 y.o. G1P0000 [redacted]w[redacted]d based on Patient's last menstrual period was 02/22/2020 (exact date). with an Estimated Date of Delivery: 11/28/20, with the above CC.   Her periods were: regular periods every 28 days She was using no method when she conceived.  She has Positive signs or symptoms of nausea/vomiting of pregnancy. She has Negative signs or symptoms of miscarriage or preterm labor She identifies Negative Zika risk factors for her and her partner On any different medications around the time she conceived/early pregnancy: Yes  History of varicella: Yes   ROS: A 12-point review of systems was performed and negative, except as stated in the above HPI.  OBGYN History: As per HPI. OB History  Gravida Para Term Preterm AB Living  1 0 0 0 0 0  SAB TAB Ectopic Multiple Live Births  0 0 0 0 0    # Outcome Date GA Lbr Len/2nd Weight Sex Delivery Anes PTL Lv  1 Current             Any issues with any prior pregnancies: not applicable Any prior children are healthy, doing well, without any problems or issues: not applicable History of pap smears: No. History of STIs: No   Past Medical History: Past Medical History:  Diagnosis Date  . GERD (gastroesophageal reflux disease)   . Tachycardia     Past Surgical History: Past Surgical History:  Procedure Laterality Date  . NO PAST SURGERIES      Family History:  Family History  Problem Relation Age of Onset  . Diabetes Mellitus II Mother   . Hypertension Mother   . Hypertension Father   . Stomach cancer Maternal Aunt   . Stomach cancer Paternal Grandmother    She denies any female cancers, bleeding or blood clotting disorders.  She denies any history of mental retardation, birth defects or genetic disorders in her or the FOB's history  Social History:  Social History   Socioeconomic History  . Marital  status: Unknown    Spouse name: Not on file  . Number of children: Not on file  . Years of education: Not on file  . Highest education level: Not on file  Occupational History  . Not on file  Tobacco Use  . Smoking status: Never Smoker  . Smokeless tobacco: Never Used  Vaping Use  . Vaping Use: Never used  Substance and Sexual Activity  . Alcohol use: Not Currently  . Drug use: Not Currently  . Sexual activity: Yes    Birth control/protection: None  Other Topics Concern  . Not on file  Social History Narrative  . Not on file   Social Determinants of Health   Financial Resource Strain:   . Difficulty of Paying Living Expenses: Not on file  Food Insecurity:   . Worried About Programme researcher, broadcasting/film/video in the Last Year: Not on file  . Ran Out of Food in the Last Year: Not on file  Transportation Needs:   . Lack of Transportation (Medical): Not on file  . Lack of Transportation (Non-Medical): Not on file  Physical Activity:   . Days of Exercise per Week: Not on file  . Minutes of Exercise per Session: Not on file  Stress:   . Feeling of Stress : Not on file  Social Connections:   . Frequency of Communication with Friends and Family: Not on  file  . Frequency of Social Gatherings with Friends and Family: Not on file  . Attends Religious Services: Not on file  . Active Member of Clubs or Organizations: Not on file  . Attends Banker Meetings: Not on file  . Marital Status: Not on file  Intimate Partner Violence:   . Fear of Current or Ex-Partner: Not on file  . Emotionally Abused: Not on file  . Physically Abused: Not on file  . Sexually Abused: Not on file   Any pets in the household: no  Allergy: Allergies  Allergen Reactions  . Dust Mite Extract Hives    Current Outpatient Medications:  Current Outpatient Medications:  .  Doxylamine-Pyridoxine (DICLEGIS) 10-10 MG TBEC, Take 2 tablets by mouth at bedtime. If symptoms persist, add one tablet in the morning  and one in the afternoon, Disp: 100 tablet, Rfl: 5 .  promethazine (PHENERGAN) 12.5 MG suppository, Place 1 suppository (12.5 mg total) rectally every 6 (six) hours as needed for nausea or vomiting. (Patient not taking: Reported on 04/09/2020), Disp: 12 each, Rfl: 1   Physical Exam:   BP 120/80   Wt 135 lb (61.2 kg)   LMP 02/22/2020 (Exact Date)   BMI 22.47 kg/m  Body mass index is 22.47 kg/m. Constitutional: Well nourished, well developed female in no acute distress.  Neck:  Supple, normal appearance, and no thyromegaly  Cardiovascular: S1, S2 normal, no murmur, rub or gallop, regular rate and rhythm Respiratory:  Clear to auscultation bilateral. Normal respiratory effort Abdomen: positive bowel sounds and no masses, hernias; diffusely non tender to palpation, non distended Breasts: breasts appear normal, no suspicious masses, no skin or nipple changes or axillary nodes. Neuro/Psych:  Normal mood and affect.  Skin:  Warm and dry.  Lymphatic:  No inguinal lymphadenopathy.   Pelvic exam: is not limited by body habitus EGBUS: within normal limits, Vagina: within normal limits and with no blood in the vault, Cervix: normal appearing cervix without discharge or lesions, closed/long/high, Uterus:  enlarged: 8 weeks, and Adnexa:  normal adnexa  Assessment: Alexis [name] is a 19 y.o. G1P0000 [redacted]w[redacted]d based on Patient's last menstrual period was 02/22/2020 (exact date). with an Estimated Date of Delivery: 11/28/20,  for prenatal care.  Plan:  1) Avoid alcoholic beverages. 2) Patient encouraged not to smoke.  3) Discontinue the use of all non-medicinal drugs and chemicals.  4) Take prenatal vitamins daily.  5) Seatbelt use advised 6) Nutrition, food safety (fish, cheese advisories, and high nitrite foods) and exercise discussed. 7) Hospital and practice style delivering at [facility] discussed  8) Patient is asked about travel to areas at risk for the Zika virus, and counseled to avoid travel and  exposure to mosquitoes or sexual partners who may have themselves been exposed to the virus. Testing is discussed, and will be ordered as appropriate.  9) Childbirth classes at [facility] advised 10) Genetic Screening, such as with 1st Trimester Screening, cell free fetal DNA, AFP testing, and Ultrasound, as well as with amniocentesis and CVS as appropriate, is discussed with patient. She plans to consider genetic testing this pregnancy. 11) [place] soon for follow up twins 12) Diclegis for nausea.  Problem list reviewed and updated.  Annamarie Major, MD, [facility], [facility] 04/09/2020  2:26 PM

## 2020-04-10 LAB — RPR+RH+ABO+RUB AB+AB SCR+CB...
Antibody Screen: NEGATIVE
HIV Screen 4th Generation wRfx: NONREACTIVE
Hematocrit: 39 % (ref 34.0–46.6)
Hemoglobin: 12.8 g/dL (ref 11.1–15.9)
Hepatitis B Surface Ag: NEGATIVE
MCH: 29.9 pg (ref 26.6–33.0)
MCHC: 32.8 g/dL (ref 31.5–35.7)
MCV: 91 fL (ref 79–97)
Platelets: 215 10*3/uL (ref 150–450)
RBC: 4.28 x10E6/uL (ref 3.77–5.28)
RDW: 11.9 % (ref 11.7–15.4)
RPR Ser Ql: NONREACTIVE
Rh Factor: POSITIVE
Rubella Antibodies, IGG: 3.18 index (ref >0.99)
Varicella zoster IgG: <135 index — ABNORMAL LOW (ref >165)
WBC: 8 10*3/uL (ref 3.4–10.8)

## 2020-04-10 LAB — HGB FRACTIONATION CASCADE
Hgb A2: 2.9 % (ref 1.8–3.2)
Hgb A: 97.1 % (ref 96.4–98.8)
Hgb F: 0 % (ref 0.0–2.0)
Hgb S: 0 %

## 2020-04-17 ENCOUNTER — Other Ambulatory Visit: Payer: Self-pay

## 2020-04-17 ENCOUNTER — Encounter: Payer: Self-pay | Admitting: Obstetrics and Gynecology

## 2020-04-17 ENCOUNTER — Ambulatory Visit (INDEPENDENT_AMBULATORY_CARE_PROVIDER_SITE_OTHER): Payer: Medicaid Other

## 2020-04-17 ENCOUNTER — Other Ambulatory Visit: Payer: Self-pay | Admitting: Obstetrics & Gynecology

## 2020-04-17 ENCOUNTER — Ambulatory Visit (INDEPENDENT_AMBULATORY_CARE_PROVIDER_SITE_OTHER): Payer: Medicaid Other | Admitting: Obstetrics and Gynecology

## 2020-04-17 VITALS — BP 118/76 | Wt 137.0 lb

## 2020-04-17 DIAGNOSIS — O0991 Supervision of high risk pregnancy, unspecified, first trimester: Secondary | ICD-10-CM | POA: Diagnosis not present

## 2020-04-17 DIAGNOSIS — O30041 Twin pregnancy, dichorionic/diamniotic, first trimester: Secondary | ICD-10-CM | POA: Diagnosis not present

## 2020-04-17 DIAGNOSIS — Z3A01 Less than 8 weeks gestation of pregnancy: Secondary | ICD-10-CM

## 2020-04-17 NOTE — Progress Notes (Signed)
Routine Prenatal Care Visit  Subjective  Alexis [name] is a 19 y.o. G1P0000 at [redacted]w[redacted]d being seen today for ongoing prenatal care.  She is currently monitored for the following issues for this high-risk pregnancy and has GERD (gastroesophageal reflux disease); Dichorionic diamniotic twin pregnancy in first trimester; and High-risk pregnancy, first trimester on their problem list.  ----------------------------------------------------------------------------------- Patient reports no complaints.    . Vag. Bleeding: None.  Movement: Absent. Leaking Fluid denies.  Dating u/s confirms di/di twins and [redacted]w[redacted]d.  ----------------------------------------------------------------------------------- The following portions of the patient's history were reviewed and updated as appropriate: allergies, current medications, past family history, past medical history, past social history, past surgical history and problem list. Problem list updated.  Objective  Blood pressure 118/76, weight 137 lb (62.1 kg), last menstrual period 02/22/2020. Pregravid weight 135 lb (61.2 kg) Total Weight Gain 2 lb (0.907 kg) Urinalysis: Urine Protein    Urine Glucose    Fetal Status: Fetal Heart Rate (bpm): 175/169 (U/S)   Movement: Absent     General:  Alert, oriented and cooperative. Patient is in no acute distress.  Skin: Skin is warm and dry. No rash noted.   Cardiovascular: Normal heart rate noted  Respiratory: Normal respiratory effort, no problems with respiration noted  Abdomen: Soft, gravid, appropriate for gestational age. Pain/Pressure: Absent     Pelvic:  Cervical exam deferred        Extremities: Normal range of motion.     Mental Status: Normal mood and affect. Normal behavior. Normal judgment and thought content.   Imaging Results US OB Transvaginal  Result Date: 04/17/2020 Patient Name: Alexis [name] DOB: 09-08-2000 MRN: [id number] ULTRASOUND REPORT Location: [facility] Date of Service: 04/17/2020  Indications:Viability of Twins Findings: Di/Di twin  pregnancy is visualized with a CRL consistent with [redacted]w[redacted]d gestation, giving an (U/S) EDD of 11/26/2020 for both Twins. The (U/S) EDD is consistent with the clinically established EDD of 11/28/2020. FHR: 176 BPM for Twin A and 169 for Twin B CRL measurement: 16.9 mm Twin A CRL measurement: 16.8 mm Twin B Twin A is on the left. Twin B on the right. Yolk sac is visualized and appears normal for both Twins. Amnion: visualized and appears normal for both Twins. Right Ovary is normal in appearance. Left Ovary is normal appearance. Corpus luteal cyst:  Left ovary There are two corpus luteal cysts in the left ovary. Survey of the adnexa demonstrates no adnexal masses. There is no free peritoneal fluid in the cul de sac. Impression: 1. [redacted]w[redacted]d Viable Di/Di twin pregnancy by U/S. 2. (U/S) EDD is consistent with Clinically established EDD of 11/28/2020, [redacted]w[redacted]d. Deanna Artis, RT There is a viable singleton gestation.  Detailed evaluation of the fetal anatomy is precluded by early gestational age.  It must be noted that a normal ultrasound particular at this early gestational age is unable to rule out fetal aneuploidy, risk of first trimester miscarriage, or anatomic birth defects. Thomasene Mohair, MD, [facility], [facility] 04/17/2020 10:04 AM      Assessment   19 y.o. G1P0000 at [redacted]w[redacted]d by  11/28/2020, by Last Menstrual Period presenting for routine prenatal visit  Plan   [email or web address]  Problems (from 02/22/20 to present)    Problem Noted Resolved   Dichorionic diamniotic twin pregnancy in first trimester 04/09/2020 by Nadara Mustard, MD No   High-risk pregnancy, first trimester 04/09/2020 by Nadara Mustard, MD No   Overview Signed 04/17/2020 10:30 AM by Conard Novak, MD  Clinic [facility] Prenatal Labs  Dating L=8 Blood type: O/Positive/-- (10/25 1417)   Genetic Screen 1 Screen:    AFP:     Quad:     NIPS: Antibody:Negative (10/25  1417)  Anatomic [place]  Rubella: 3.18 (10/25 1417) Varicella: @VZVIGG @  GTT Early:               Third trimester:  RPR: Non Reactive (10/25 1417)   Rhogam  HBsAg: Negative (10/25 1417)   TDaP vaccine                       Flu Shot: HIV: Non Reactive (10/25 1417)   Baby Food                                GBS:   Contraception  Pap:  CBB     CS/VBAC    Support Person              Preterm labor symptoms and general obstetric precautions including but not limited to vaginal bleeding, contractions, leaking of fluid and fetal movement were reviewed in detail with the patient. Please refer to After Visit Summary for other counseling recommendations.   Return in about 3 weeks (around 05/08/2020) for ROB and NIPT testing (lab).   05/10/2020, MD, [facility], [facility] 04/17/2020 10:29 AM

## 2020-05-08 ENCOUNTER — Encounter: Payer: Self-pay | Admitting: Obstetrics and Gynecology

## 2020-05-08 ENCOUNTER — Ambulatory Visit (INDEPENDENT_AMBULATORY_CARE_PROVIDER_SITE_OTHER): Payer: Medicaid Other | Admitting: Obstetrics and Gynecology

## 2020-05-08 ENCOUNTER — Other Ambulatory Visit: Payer: Self-pay

## 2020-05-08 VITALS — BP 114/70 | Ht 64.0 in | Wt 136.2 lb

## 2020-05-08 DIAGNOSIS — Z1379 Encounter for other screening for genetic and chromosomal anomalies: Secondary | ICD-10-CM

## 2020-05-08 DIAGNOSIS — Z3A10 10 weeks gestation of pregnancy: Secondary | ICD-10-CM

## 2020-05-08 DIAGNOSIS — K5901 Slow transit constipation: Secondary | ICD-10-CM

## 2020-05-08 DIAGNOSIS — O0991 Supervision of high risk pregnancy, unspecified, first trimester: Secondary | ICD-10-CM

## 2020-05-08 DIAGNOSIS — K2100 Gastro-esophageal reflux disease with esophagitis, without bleeding: Secondary | ICD-10-CM

## 2020-05-08 DIAGNOSIS — O30041 Twin pregnancy, dichorionic/diamniotic, first trimester: Secondary | ICD-10-CM

## 2020-05-08 MED ORDER — POLYETHYLENE GLYCOL 3350 17 G PO PACK: 17.0000 g | PACK | Freq: Every day | ORAL | 11 refills | Status: DC

## 2020-05-08 MED ORDER — SUCRALFATE 1 G PO TABS: 1.0000 g | ORAL_TABLET | Freq: Three times a day (TID) | ORAL | 6 refills | Status: DC

## 2020-05-08 NOTE — Progress Notes (Signed)
    Routine Prenatal Care Visit  Subjective  Alexis [name] is a 19 y.o. G1P0000 at [redacted]w[redacted]d being seen today for ongoing prenatal care.  She is currently monitored for the following issues for this high-risk pregnancy and has GERD (gastroesophageal reflux disease); Dichorionic diamniotic twin pregnancy in first trimester; and High-risk pregnancy, first trimester on their problem list.  ----------------------------------------------------------------------------------- Patient reports heartburn.   Contractions: Not present. Vag. Bleeding: None.  Movement: Absent. Denies leaking of fluid.  ----------------------------------------------------------------------------------- The following portions of the patient's history were reviewed and updated as appropriate: allergies, current medications, past family history, past medical history, past social history, past surgical history and problem list. Problem list updated.   Objective  Blood pressure 114/70, height 5\' 4"  (1.626 m), weight 136 lb 3.2 oz (61.8 kg), last menstrual period 02/22/2020. Pregravid weight 135 lb (61.2 kg) Total Weight Gain 1 lb 3.2 oz (0.544 kg) Urinalysis:      Fetal Status: Fetal Heart Rate (bpm): present/present   Movement: Absent     General:  Alert, oriented and cooperative. Patient is in no acute distress.  Skin: Skin is warm and dry. No rash noted.   Cardiovascular: Normal heart rate noted  Respiratory: Normal respiratory effort, no problems with respiration noted  Abdomen: Soft, gravid, appropriate for gestational age. Pain/Pressure: Absent     Pelvic:  Cervical exam deferred        Extremities: Normal range of motion.  Edema: None  Mental Status: Normal mood and affect. Normal behavior. Normal judgment and thought content.     Assessment   20 y.o. G1P0000 at [redacted]w[redacted]d by  11/28/2020, by Last Menstrual Period presenting for routine prenatal visit  Plan   [email or web address]  Problems (from 02/22/20 to present)     Problem Noted Resolved   Dichorionic diamniotic twin pregnancy in first trimester 04/09/2020 by 04/11/2020, MD No   High-risk pregnancy, first trimester 04/09/2020 by 04/11/2020, MD No   Overview Signed 04/17/2020 10:30 AM by 13/07/2019, MD    Clinic [facility] Prenatal Labs  Dating L=8 Blood type: O/Positive/-- (10/25 1417)   Genetic Screen 1 Screen:    AFP:     Quad:     NIPS: Antibody:Negative (10/25 1417)  Anatomic [birth year]1982  Rubella: 3.18 (10/25 1417) Varicella: @VZVIGG @  GTT Early:               Third trimester:  RPR: Non Reactive (10/25 1417)   Rhogam  HBsAg: Negative (10/25 1417)   TDaP vaccine                       Flu Shot: HIV: Non Reactive (10/25 1417)   Baby Food                                GBS:   Contraception  Pap:  CBB     CS/VBAC    Support Person                Carafate for GERD Miralax for constipation NIPS testing today  Gestational age appropriate obstetric precautions including but not limited to vaginal bleeding, contractions, leaking of fluid and fetal movement were reviewed in detail with the patient.    Return in about 4 weeks (around 06/05/2020) for ROB in person.  08-16-1982 MD [facility], [facility] 05/08/2020, 10:20 AM

## 2020-05-08 NOTE — Progress Notes (Signed)
ROB

## 2020-05-08 NOTE — Patient Instructions (Addendum)
Mom should take 60-120mg  of elemental iron a day as well as 1mg  of folic acid First Trimester of Pregnancy The first trimester of pregnancy is from week 1 until the end of week 13 (months 1 through 3). A week after a sperm fertilizes an egg, the egg will implant on the wall of the uterus. This embryo will begin to develop into a baby. Genes from you and your partner will form the baby. The female genes will determine whether the baby will be a boy or a girl. At 6-8 weeks, the eyes and face will be formed, and the heartbeat can be seen on ultrasound. At the end of 12 weeks, all the baby's organs will be formed. Now that you are pregnant, you will want to do everything you can to have a healthy baby. Two of the most important things are to get good prenatal care and to follow your health care provider's instructions. Prenatal care is all the medical care you receive before the baby's birth. This care will help prevent, find, and treat any problems during the pregnancy and childbirth. Body changes during your first trimester Your body goes through many changes during pregnancy. The changes vary from woman to woman.  You may gain or lose a couple of pounds at first.  You may feel sick to your stomach (nauseous) and you may throw up (vomit). If the vomiting is uncontrollable, call your health care provider.  You may tire easily.  You may develop headaches that can be relieved by medicines. All medicines should be approved by your health care provider.  You may urinate more often. Painful urination may mean you have a bladder infection.  You may develop heartburn as a result of your pregnancy.  You may develop constipation because certain hormones are causing the muscles that push stool through your intestines to slow down.  You may develop hemorrhoids or swollen veins (varicose veins).  Your breasts may begin to grow larger and become tender. Your nipples may stick out more, and the tissue that  surrounds them (areola) may become darker.  Your gums may bleed and may be sensitive to brushing and flossing.  Dark spots or blotches (chloasma, mask of pregnancy) may develop on your face. This will likely fade after the baby is born.  Your menstrual periods will stop.  You may have a loss of appetite.  You may develop cravings for certain kinds of food.  You may have changes in your emotions from day to day, such as being excited to be pregnant or being concerned that something may go wrong with the pregnancy and baby.  You may have more vivid and strange dreams.  You may have changes in your hair. These can include thickening of your hair, rapid growth, and changes in texture. Some women also have hair loss during or after pregnancy, or hair that feels dry or thin. Your hair will most likely return to normal after your baby is born. What to expect at prenatal visits During a routine prenatal visit:  You will be weighed to make sure you and the baby are growing normally.  Your blood pressure will be taken.  Your abdomen will be measured to track your baby's growth.  The fetal heartbeat will be listened to between weeks 10 and 14 of your pregnancy.  Test results from any previous visits will be discussed. Your health care provider may ask you:  How you are feeling.  If you are feeling the baby move.  If you have had any abnormal symptoms, such as leaking fluid, bleeding, severe headaches, or abdominal cramping.  If you are using any tobacco products, including cigarettes, chewing tobacco, and electronic cigarettes.  If you have any questions. Other tests that may be performed during your first trimester include:  Blood tests to find your blood type and to check for the presence of any previous infections. The tests will also be used to check for low iron levels (anemia) and protein on red blood cells (Rh antibodies). Depending on your risk factors, or if you previously had  diabetes during pregnancy, you may have tests to check for high blood sugar that affects pregnant women (gestational diabetes).  Urine tests to check for infections, diabetes, or protein in the urine.  An ultrasound to confirm the proper growth and development of the baby.  Fetal screens for spinal cord problems (spina bifida) and Down syndrome.  HIV (human immunodeficiency virus) testing. Routine prenatal testing includes screening for HIV, unless you choose not to have this test.  You may need other tests to make sure you and the baby are doing well. Follow these instructions at home: Medicines  Follow your health care provider's instructions regarding medicine use. Specific medicines may be either safe or unsafe to take during pregnancy.  Take a prenatal vitamin that contains at least 600 micrograms (mcg) of folic acid.  If you develop constipation, try taking a stool softener if your health care provider approves. Eating and drinking   Eat a balanced diet that includes fresh fruits and vegetables, whole grains, good sources of protein such as meat, eggs, or tofu, and low-fat dairy. Your health care provider will help you determine the amount of weight gain that is right for you.  Avoid raw meat and uncooked cheese. These carry germs that can cause birth defects in the baby.  Eating four or five small meals rather than three large meals a day may help relieve nausea and vomiting. If you start to feel nauseous, eating a few soda crackers can be helpful. Drinking liquids between meals, instead of during meals, also seems to help ease nausea and vomiting.  Limit foods that are high in fat and processed sugars, such as fried and sweet foods.  To prevent constipation: ? Eat foods that are high in fiber, such as fresh fruits and vegetables, whole grains, and beans. ? Drink enough fluid to keep your urine clear or pale yellow. Activity  Exercise only as directed by your health care  provider. Most women can continue their usual exercise routine during pregnancy. Try to exercise for 30 minutes at least 5 days a week. Exercising will help you: ? Control your weight. ? Stay in shape. ? Be prepared for labor and delivery.  Experiencing pain or cramping in the lower abdomen or lower back is a good sign that you should stop exercising. Check with your health care provider before continuing with normal exercises.  Try to avoid standing for long periods of time. Move your legs often if you must stand in one place for a long time.  Avoid heavy lifting.  Wear low-heeled shoes and practice good posture.  You may continue to have sex unless your health care provider tells you not to. Relieving pain and discomfort  Wear a good support bra to relieve breast tenderness.  Take warm sitz baths to soothe any pain or discomfort caused by hemorrhoids. Use hemorrhoid cream if your health care provider approves.  Rest with your legs elevated  if you have leg cramps or low back pain.  If you develop varicose veins in your legs, wear support hose. Elevate your feet for 15 minutes, 3-4 times a day. Limit salt in your diet. Prenatal care  Schedule your prenatal visits by the twelfth week of pregnancy. They are usually scheduled monthly at first, then more often in the last 2 months before delivery.  Write down your questions. Take them to your prenatal visits.  Keep all your prenatal visits as told by your health care provider. This is important. Safety  Wear your seat belt at all times when driving.  Make a list of emergency phone numbers, including numbers for family, friends, the hospital, and police and fire departments. General instructions  Ask your health care provider for a referral to a local prenatal education class. Begin classes no later than the beginning of month 6 of your pregnancy.  Ask for help if you have counseling or nutritional needs during pregnancy. Your  health care provider can offer advice or refer you to specialists for help with various needs.  Do not use hot tubs, steam rooms, or saunas.  Do not douche or use tampons or scented sanitary pads.  Do not cross your legs for long periods of time.  Avoid cat litter boxes and soil used by cats. These carry germs that can cause birth defects in the baby and possibly loss of the fetus by miscarriage or stillbirth.  Avoid all smoking, herbs, alcohol, and medicines not prescribed by your health care provider. Chemicals in these products affect the formation and growth of the baby.  Do not use any products that contain nicotine or tobacco, such as cigarettes and e-cigarettes. If you need help quitting, ask your health care provider. You may receive counseling support and other resources to help you quit.  Schedule a dentist appointment. At home, brush your teeth with a soft toothbrush and be gentle when you floss. Contact a health care provider if:  You have dizziness.  You have mild pelvic cramps, pelvic pressure, or nagging pain in the abdominal area.  You have persistent nausea, vomiting, or diarrhea.  You have a bad smelling vaginal discharge.  You have pain when you urinate.  You notice increased swelling in your face, hands, legs, or ankles.  You are exposed to fifth disease or chickenpox.  You are exposed to [place] measles (rubella) and have never had it. Get help right away if:  You have a fever.  You are leaking fluid from your vagina.  You have spotting or bleeding from your vagina.  You have severe abdominal cramping or pain.  You have rapid weight gain or loss.  You vomit blood or material that looks like coffee grounds.  You develop a severe headache.  You have shortness of breath.  You have any kind of trauma, such as from a fall or a car accident. Summary  The first trimester of pregnancy is from week 1 until the end of week 13 (months 1 through  3).  Your body goes through many changes during pregnancy. The changes vary from woman to woman.  You will have routine prenatal visits. During those visits, your health care provider will examine you, discuss any test results you may have, and talk with you about how you are feeling. This information is not intended to replace advice given to you by your health care provider. Make sure you discuss any questions you have with your health care provider. Document Revised: 05/15/2017  Document Reviewed: 05/14/2016 Elsevier Patient Education  [employer].   Constipation, Adult Constipation is when a person has fewer bowel movements in a week than normal, has difficulty having a bowel movement, or has stools that are dry, hard, or larger than normal. Constipation may be caused by an underlying condition. It may become worse with age if a person takes certain medicines and does not take in enough fluids. Follow these instructions at home: Eating and drinking   Eat foods that have a lot of fiber, such as fresh fruits and vegetables, whole grains, and beans.  Limit foods that are high in fat, low in fiber, or overly processed, such as french fries, hamburgers, cookies, candies, and soda.  Drink enough fluid to keep your urine clear or pale yellow. General instructions  Exercise regularly or as told by your health care provider.  Go to the restroom when you have the urge to go. Do not hold it in.  Take over-the-counter and prescription medicines only as told by your health care provider. These include any fiber supplements.  Practice pelvic floor retraining exercises, such as deep breathing while relaxing the lower abdomen and pelvic floor relaxation during bowel movements.  Watch your condition for any changes.  Keep all follow-up visits as told by your health care provider. This is important. Contact a health care provider if:  You have pain that gets worse.  You have a  fever.  You do not have a bowel movement after 4 days.  You vomit.  You are not hungry.  You lose weight.  You are bleeding from the anus.  You have thin, pencil-like stools. Get help right away if:  You have a fever and your symptoms suddenly get worse.  You leak stool or have blood in your stool.  Your abdomen is bloated.  You have severe pain in your abdomen.  You feel dizzy or you faint. This information is not intended to replace advice given to you by your health care provider. Make sure you discuss any questions you have with your health care provider. Document Revised: 05/15/2017 Document Reviewed: 11/21/2015 Elsevier Patient Education  2020 [employer]  Gastroesophageal Reflux Disease, Adult Gastroesophageal reflux (GER) happens when acid from the stomach flows up into the tube that connects the mouth and the stomach (esophagus). Normally, food travels down the esophagus and stays in the stomach to be digested. However, when a person has GER, food and stomach acid sometimes move back up into the esophagus. If this becomes a more serious problem, the person may be diagnosed with a disease called gastroesophageal reflux disease (GERD). GERD occurs when the reflux:  Happens often.  Causes frequent or severe symptoms.  Causes problems such as damage to the esophagus. When stomach acid comes in contact with the esophagus, the acid may cause soreness (inflammation) in the esophagus. Over time, GERD may create small holes (ulcers) in the lining of the esophagus. What are the causes? This condition is caused by a problem with the muscle between the esophagus and the stomach (lower esophageal sphincter, or LES). Normally, the LES muscle closes after food passes through the esophagus to the stomach. When the LES is weakened or abnormal, it does not close properly, and that allows food and stomach acid to go back up into the esophagus. The LES can be weakened by certain  dietary substances, medicines, and medical conditions, including:  Tobacco use.  Pregnancy.  Having a hiatal hernia.  Alcohol use.  Certain foods  and beverages, such as coffee, chocolate, onions, and peppermint. What increases the risk? You are more likely to develop this condition if you:  Have an increased body weight.  Have a connective tissue disorder.  Use NSAID medicines. What are the signs or symptoms? Symptoms of this condition include:  Heartburn.  Difficult or painful swallowing.  The feeling of having a lump in the throat.  Abitter taste in the mouth.  Bad breath.  Having a large amount of saliva.  Having an upset or bloated stomach.  Belching.  Chest pain. Different conditions can cause chest pain. Make sure you see your health care provider if you experience chest pain.  Shortness of breath or wheezing.  Ongoing (chronic) cough or a night-time cough.  Wearing away of tooth enamel.  Weight loss. How is this diagnosed? Your health care provider will take a medical history and perform a physical exam. To determine if you have mild or severe GERD, your health care provider may also monitor how you respond to treatment. You may also have tests, including:  A test to examine your stomach and esophagus with a small camera (endoscopy).  A test thatmeasures the acidity level in your esophagus.  A test thatmeasures how much pressure is on your esophagus.  A barium swallow or modified barium swallow test to show the shape, size, and functioning of your esophagus. How is this treated? The goal of treatment is to help relieve your symptoms and to prevent complications. Treatment for this condition may vary depending on how severe your symptoms are. Your health care provider may recommend:  Changes to your diet.  Medicine.  Surgery. Follow these instructions at home: Eating and drinking   Follow a diet as recommended by your health care provider.  This may involve avoiding foods and drinks such as: ? Coffee and tea (with or without caffeine). ? Drinks that containalcohol. ? Energy drinks and sports drinks. ? Carbonated drinks or sodas. ? Chocolate and cocoa. ? Peppermint and mint flavorings. ? Garlic and onions. ? Horseradish. ? Spicy and acidic foods, including peppers, chili powder, curry powder, vinegar, hot sauces, and barbecue sauce. ? Citrus fruit juices and citrus fruits, such as oranges, lemons, and limes. ? Tomato-based foods, such as red sauce, chili, salsa, and pizza with red sauce. ? Fried and fatty foods, such as donuts, french fries, potato chips, and high-fat dressings. ? High-fat meats, such as hot dogs and fatty cuts of red and white meats, such as rib eye steak, sausage, ham, and bacon. ? High-fat dairy items, such as whole milk, butter, and cream cheese.  Eat small, frequent meals instead of large meals.  Avoid drinking large amounts of liquid with your meals.  Avoid eating meals during the 2-3 hours before bedtime.  Avoid lying down right after you eat.  Do not exercise right after you eat. Lifestyle   Do not use any products that contain nicotine or tobacco, such as cigarettes, e-cigarettes, and chewing tobacco. If you need help quitting, ask your health care provider.  Try to reduce your stress by using methods such as yoga or meditation. If you need help reducing stress, ask your health care provider.  If you are overweight, reduce your weight to an amount that is healthy for you. Ask your health care provider for guidance about a safe weight loss goal. General instructions  Pay attention to any changes in your symptoms.  Take over-the-counter and prescription medicines only as told by your health care provider. Do  not take aspirin, ibuprofen, or other NSAIDs unless your health care provider told you to do so.  Wear loose-fitting clothing. Do not wear anything tight around your waist that causes  pressure on your abdomen.  Raise (elevate) the head of your bed about 6 inches (15 cm).  Avoid bending over if this makes your symptoms worse.  Keep all follow-up visits as told by your health care provider. This is important. Contact a health care provider if:  You have: ? New symptoms. ? Unexplained weight loss. ? Difficulty swallowing or it hurts to swallow. ? Wheezing or a persistent cough. ? A hoarse voice.  Your symptoms do not improve with treatment. Get help right away if you:  Have pain in your arms, neck, jaw, teeth, or back.  Feel sweaty, dizzy, or light-headed.  Have chest pain or shortness of breath.  Vomit and your vomit looks like blood or coffee grounds.  Faint.  Have stool that is bloody or black.  Cannot swallow, drink, or eat. Summary  Gastroesophageal reflux happens when acid from the stomach flows up into the esophagus. GERD is a disease in which the reflux happens often, causes frequent or severe symptoms, or causes problems such as damage to the esophagus.  Treatment for this condition may vary depending on how severe your symptoms are. Your health care provider may recommend diet and lifestyle changes, medicine, or surgery.  Contact a health care provider if you have new or worsening symptoms.  Take over-the-counter and prescription medicines only as told by your health care provider. Do not take aspirin, ibuprofen, or other NSAIDs unless your health care provider told you to do so.  Keep all follow-up visits as told by your health care provider. This is important. This information is not intended to replace advice given to you by your health care provider. Make sure you discuss any questions you have with your health care provider. Document Revised: 12/09/2017 Document Reviewed: 12/09/2017 Elsevier Patient Education  2020 [employer].

## 2020-05-14 ENCOUNTER — Other Ambulatory Visit: Payer: Self-pay

## 2020-05-14 ENCOUNTER — Encounter: Payer: Self-pay | Admitting: Obstetrics and Gynecology

## 2020-05-14 ENCOUNTER — Ambulatory Visit (INDEPENDENT_AMBULATORY_CARE_PROVIDER_SITE_OTHER): Payer: Medicaid Other | Admitting: Obstetrics and Gynecology

## 2020-05-14 DIAGNOSIS — R102 Pelvic and perineal pain: Secondary | ICD-10-CM

## 2020-05-14 DIAGNOSIS — O0991 Supervision of high risk pregnancy, unspecified, first trimester: Secondary | ICD-10-CM

## 2020-05-14 DIAGNOSIS — O2341 Unspecified infection of urinary tract in pregnancy, first trimester: Secondary | ICD-10-CM

## 2020-05-14 DIAGNOSIS — O26891 Other specified pregnancy related conditions, first trimester: Secondary | ICD-10-CM

## 2020-05-14 DIAGNOSIS — M545 Low back pain, unspecified: Secondary | ICD-10-CM

## 2020-05-14 DIAGNOSIS — Z3A11 11 weeks gestation of pregnancy: Secondary | ICD-10-CM

## 2020-05-14 LAB — POCT URINALYSIS DIPSTICK
Bilirubin, UA: NEGATIVE
Blood, UA: NEGATIVE
Glucose, UA: NEGATIVE
Ketones, UA: NEGATIVE
Nitrite, UA: NEGATIVE
Protein, UA: NEGATIVE
Spec Grav, UA: 1.01 (ref 1.010–1.025)
Urobilinogen, UA: NEGATIVE E.U./dL — AB
pH, UA: 5 (ref 5.0–8.0)

## 2020-05-14 MED ORDER — NITROFURANTOIN MONOHYD MACRO 100 MG PO CAPS: 100.0000 mg | ORAL_CAPSULE | Freq: Two times a day (BID) | ORAL | 0 refills | Status: AC

## 2020-05-14 NOTE — Progress Notes (Signed)
HPI:      [name] is a 19 y.o. G1P0000 at [redacted]w[redacted]d by [place] (DiDi twin pregnancy), who presents today for a problem visit.    Urinary Tract Infection: Patient complains of burning with urination . She has had symptoms for 4 days. Patient also reports of bilateral lower back pain which has been present since the beginning of pregnancy, comes and goes and is rated at a 4/10. The back pain occasional radiates towards the front of the patient into her pelvic area. She also reports bilateral abdominal pain that began 4-5 days ago, is rated 4/10, and comes and goes a few times each day. Patient denies fever. Patient does not have a history of recurrent UTI.  Patient does not have a history of pyelonephritis. The patient reports occasional use of a heating pad has helped with the back pain.  PMHx: She  has a past medical history of GERD (gastroesophageal reflux disease) and Tachycardia. Also,  has a past surgical history that includes No past surgeries., family history includes Diabetes Mellitus II in her mother; Hypertension in her father and mother; Stomach cancer in her maternal aunt and paternal grandmother.,  reports that she has never smoked. She has never used smokeless tobacco. She reports previous alcohol use. She reports previous drug use.  She has a current medication list which includes the following prescription(s): cetirizine, doxylamine-pyridoxine, polyethylene glycol, nitrofurantoin (macrocrystal-monohydrate), promethazine, sucralfate, [DISCONTINUED] omeprazole, and [DISCONTINUED] sertraline. Also, is allergic to dust mite extract.  Review of Systems  Constitutional: Negative for fever and malaise/fatigue.  HENT: Negative.   Eyes: Negative.   Respiratory: Negative.   Cardiovascular: Negative.   Gastrointestinal: Positive for abdominal pain. Negative for constipation, diarrhea, nausea and vomiting.  Genitourinary: Positive for dysuria and frequency. Negative for flank pain,  hematuria and urgency.  Musculoskeletal: Negative for myalgias.  Skin: Negative.   Neurological: Negative.   Endo/Heme/Allergies: Negative.   Psychiatric/Behavioral: Negative.     Objective: LMP 02/22/2020 (Exact Date)  Physical Exam Constitutional:      Appearance: Normal appearance.  HENT:     Head: Normocephalic.  Eyes:     Pupils: Pupils are equal, round, and reactive to light.  Cardiovascular:     Rate and Rhythm: Normal rate and regular rhythm.  Pulmonary:     Effort: Pulmonary effort is normal.  Abdominal:     General: Bowel sounds are normal.     Palpations: Abdomen is soft.     Tenderness: There is no abdominal tenderness. There is no right CVA tenderness, left CVA tenderness or guarding.  Musculoskeletal:        General: Normal range of motion.  Neurological:     Mental Status: She is alert and oriented to person, place, and time.  Skin:    General: Skin is warm and dry.  Psychiatric:        Mood and Affect: Mood normal.        Behavior: Behavior normal.  Vitals reviewed.     ASSESSMENT/PLAN:   Acute cystitis  -UA dipstick +LE -Send urine cx -Start antibiotic therapy - will f/u with cx results -RTC for previously scheduled ROB  -Reviewed safety of tylenol administration in pregnancy, recommended use for back and pelvic pain associated with pregnancy    Problem List Items Addressed This Visit    None    Visit Diagnoses    UTI (urinary tract infection) during pregnancy, first trimester    -  Primary   Relevant Medications   nitrofurantoin, macrocrystal-monohydrate, (  MACROBID) 100 MG capsule   Other Relevant Orders   Urine Culture   Low back pain, unspecified back pain laterality, unspecified chronicity, unspecified whether sciatica present       Relevant Medications   nitrofurantoin, macrocrystal-monohydrate, (MACROBID) 100 MG capsule   Other Relevant Orders   POCT Urinalysis Dipstick (Completed)   Urine Culture

## 2020-05-15 ENCOUNTER — Telehealth: Payer: Self-pay

## 2020-05-15 NOTE — Telephone Encounter (Signed)
Samantha with [facility] calling to inquire about if the patient was having a single or twin pregnancy. Twin pregnancy would require order change SCA has to be changed to MAT21 core or MAT21+ ESS. Test on hold til it's clarified. CB#[id number]

## 2020-05-15 NOTE — Telephone Encounter (Signed)
This patient is pregnant with twins. Please let them know. If I need to reorder it I can. Thanks

## 2020-05-15 NOTE — Telephone Encounter (Signed)
Please choose/specify which test you would like: MaterniT21 PLUS Core (chr21,18,13,sex) or MaterniT21 PLUS Core + ESS

## 2020-05-16 LAB — URINE CULTURE

## 2020-05-17 NOTE — Telephone Encounter (Signed)
MaterniT21 PLUS Core + ESS

## 2020-05-19 ENCOUNTER — Ambulatory Visit: Admit: 2020-05-19 | Discharge status: Against Medical Advice | Payer: Medicaid Other

## 2020-05-20 ENCOUNTER — Encounter (HOSPITAL_COMMUNITY): Payer: Self-pay | Admitting: Obstetrics and Gynecology

## 2020-05-20 ENCOUNTER — Ambulatory Visit (HOSPITAL_COMMUNITY)
Admission: EM | Admit: 2020-05-20 | Discharge: 2020-05-20 | Disposition: A | Discharge status: Home / Self Care | Payer: Medicaid Other

## 2020-05-20 ENCOUNTER — Inpatient Hospital Stay (HOSPITAL_COMMUNITY)
Admission: AD | Admit: 2020-05-20 | Discharge: 2020-05-20 | Disposition: A | Discharge status: Home / Self Care | Payer: Medicaid Other | Attending: Obstetrics and Gynecology | Admitting: Obstetrics and Gynecology

## 2020-05-20 ENCOUNTER — Other Ambulatory Visit: Payer: Self-pay

## 2020-05-20 DIAGNOSIS — Z3A12 12 weeks gestation of pregnancy: Secondary | ICD-10-CM | POA: Insufficient documentation

## 2020-05-20 DIAGNOSIS — R12 Heartburn: Secondary | ICD-10-CM | POA: Diagnosis not present

## 2020-05-20 DIAGNOSIS — O26892 Other specified pregnancy related conditions, second trimester: Secondary | ICD-10-CM

## 2020-05-20 DIAGNOSIS — O0991 Supervision of high risk pregnancy, unspecified, first trimester: Secondary | ICD-10-CM | POA: Insufficient documentation

## 2020-05-20 DIAGNOSIS — O99611 Diseases of the digestive system complicating pregnancy, first trimester: Secondary | ICD-10-CM | POA: Insufficient documentation

## 2020-05-20 DIAGNOSIS — O30041 Twin pregnancy, dichorionic/diamniotic, first trimester: Secondary | ICD-10-CM | POA: Insufficient documentation

## 2020-05-20 DIAGNOSIS — O26891 Other specified pregnancy related conditions, first trimester: Secondary | ICD-10-CM

## 2020-05-20 LAB — URINALYSIS, ROUTINE W REFLEX MICROSCOPIC
Bilirubin Urine: NEGATIVE
Glucose, UA: NEGATIVE mg/dL
Hgb urine dipstick: NEGATIVE
Ketones, ur: 20 mg/dL — AB
Leukocytes,Ua: NEGATIVE
Nitrite: NEGATIVE
Protein, ur: NEGATIVE mg/dL
Specific Gravity, Urine: 1.019 (ref 1.005–1.030)
pH: 7 (ref 5.0–8.0)

## 2020-05-20 MED ORDER — LIDOCAINE VISCOUS HCL 2 % MT SOLN
15.0000 mL | Freq: Once | OROMUCOSAL | Status: AC
  Administered 2020-05-20: 15 mL via ORAL
  Filled 2020-05-20: qty 15

## 2020-05-20 MED ORDER — ALUM & MAG HYDROXIDE-SIMETH 200-200-20 MG/5ML PO SUSP
30.0000 mL | Freq: Once | ORAL | Status: AC
  Administered 2020-05-20: 30 mL via ORAL
  Filled 2020-05-20: qty 30

## 2020-05-20 NOTE — ED Triage Notes (Signed)
Pt presents with abdominal pain. States is [redacted] weeks pregnant. Per Marilynn Rail NP, pt is to go to [facility].

## 2020-05-20 NOTE — MAU Provider Note (Signed)
History     CSN: [id number]  Arrival date and time: 05/20/20 1633   First Provider Initiated Contact with Patient 05/20/20 1700      Chief Complaint  Patient presents with  . Abdominal Pain   HPI Alexis [name] is a 19 y.o. G1P0000 at [redacted]w[redacted]d who presents with upper abdominal pain. She states it started Friday and she tried protonix and tums with no relief. She states it feels like her normal acid reflux but she hasn't been able to improve it this time. She denies any vaginal bleeding, discharge or cramping. This is a twin pregnancy and she gets care at [facility].  OB History    Gravida  1   Para  0   Term  0   Preterm  0   AB  0   Living  0     SAB  0   TAB  0   Ectopic  0   Multiple  0   Live Births  0           Past Medical History:  Diagnosis Date  . GERD (gastroesophageal reflux disease)   . Tachycardia     Past Surgical History:  Procedure Laterality Date  . NO PAST SURGERIES      Family History  Problem Relation Age of Onset  . Diabetes Mellitus II Mother   . Hypertension Mother   . Hypertension Father   . Stomach cancer Maternal Aunt   . Stomach cancer Paternal Grandmother     Social History   Tobacco Use  . Smoking status: Never Smoker  . Smokeless tobacco: Never Used  Vaping Use  . Vaping Use: Never used  Substance Use Topics  . Alcohol use: Not Currently  . Drug use: Not Currently    Allergies:  Allergies  Allergen Reactions  . Dust Mite Extract Hives    Medications Prior to Admission  Medication Sig Dispense Refill Last Dose  . cetirizine (ZYRTEC) 10 MG tablet Take 10 mg by mouth daily.     . Doxylamine-Pyridoxine (DICLEGIS) 10-10 MG TBEC Take 2 tablets by mouth at bedtime. If symptoms persist, add one tablet in the morning and one in the afternoon 100 tablet 5   . polyethylene glycol (MIRALAX) 17 g packet Take 17 g by mouth daily. 30 each 11   . promethazine (PHENERGAN) 12.5 MG suppository Place 1 suppository  (12.5 mg total) rectally every 6 (six) hours as needed for nausea or vomiting. (Patient not taking: Reported on 05/14/2020) 12 each 1   . sucralfate (CARAFATE) 1 g tablet Take 1 tablet (1 g total) by mouth 4 (four) times daily -  with meals and at bedtime. (Patient not taking: Reported on 05/14/2020) 120 tablet 6     Review of Systems  Constitutional: Negative.  Negative for fatigue and fever.  HENT: Negative.   Respiratory: Negative.  Negative for shortness of breath.   Cardiovascular: Negative.  Negative for chest pain.  Gastrointestinal: Positive for abdominal pain (substernal, upper abdominal). Negative for constipation, diarrhea, nausea and vomiting.  Genitourinary: Negative.  Negative for dysuria, vaginal bleeding and vaginal discharge.  Neurological: Negative.  Negative for dizziness and headaches.   Physical Exam   Blood pressure 137/80, pulse 93, temperature 98.4 F (36.9 C), temperature source Oral, resp. rate 16, height 5\' 4"  (1.626 m), weight 63 kg, last menstrual period 02/22/2020, SpO2 100 %.  Physical Exam Vitals and nursing note reviewed.  Constitutional:      General: She is not  in acute distress.    Appearance: She is well-developed.  HENT:     Head: Normocephalic.  Eyes:     Pupils: Pupils are equal, round, and reactive to light.  Cardiovascular:     Rate and Rhythm: Normal rate and regular rhythm.     Heart sounds: Normal heart sounds.  Pulmonary:     Effort: Pulmonary effort is normal. No respiratory distress.     Breath sounds: Normal breath sounds.  Abdominal:     General: Bowel sounds are normal. There is no distension.     Palpations: Abdomen is soft.     Tenderness: There is no abdominal tenderness.  Skin:    General: Skin is warm and dry.  Neurological:     Mental Status: She is alert and oriented to person, place, and time.  Psychiatric:        Behavior: Behavior normal.        Thought Content: Thought content normal.        Judgment: Judgment  normal.     FHT A: 150 bpm         B: 154 bpm  MAU Course  Procedures  MDM GI cocktail- patient reports resolution of pain.   Discussed taking protonix BID instead of once a day and making dietary changes to improve reflux  Assessment and Plan   1. Heartburn during pregnancy in second trimester   2. High-risk pregnancy, first trimester   3. Dichorionic diamniotic twin pregnancy in first trimester   4. [redacted] weeks gestation of pregnancy    -Discharge home in stable condition -Second trimester precautions discussed -Patient advised to follow-up with OB as scheduled for prenatal care -Patient may return to MAU as needed or if her condition were to change or worsen   Rolm Bookbinder  CNM 05/20/2020, 5:00 PM

## 2020-05-20 NOTE — Discharge Instructions (Signed)

## 2020-05-20 NOTE — MAU Note (Signed)
Pt denies uterine cramping or contractions-uterus is soft and non tender. Denies vaginal bleeding, bloody show or vaginal discharge.

## 2020-05-20 NOTE — MAU Note (Signed)
Alexis [name] is a 19 y.o. at 103w4d here in MAU reporting: since Friday has been having sharp upper abdominal pain. Thought it was acid reflux but meds are not working.  Onset of complaint: since Friday  Pain score: 6/10  Vitals:   05/20/20 1646  BP: 137/80  Pulse: 93  Resp: 16  Temp: 98.4 F (36.9 C)  SpO2: 100%     Lab orders placed from triage: UA

## 2020-05-21 ENCOUNTER — Telehealth: Payer: Self-pay | Admitting: Obstetrics and Gynecology

## 2020-05-21 LAB — MATERNIT21 PLUS CORE+SCA

## 2020-05-21 NOTE — Telephone Encounter (Signed)
Pt called to report continued acid chest pain and light cough. Pt states went to [facility] ED but pt reports no relief. Please adavise pt [phone].

## 2020-05-21 NOTE — Telephone Encounter (Signed)
Pt states she is on something for acid reflux. I advised her to try to take robitussin for the cough and see how that helps

## 2020-05-23 NOTE — Telephone Encounter (Signed)
Per Victorino Dike w/[facility], test was cancelled 05/21/20. She has notified lab of correct test order for test to be reprocessed.

## 2020-06-05 ENCOUNTER — Encounter: Payer: Medicaid Other | Admitting: Obstetrics

## 2020-06-05 ENCOUNTER — Ambulatory Visit (INDEPENDENT_AMBULATORY_CARE_PROVIDER_SITE_OTHER): Payer: Medicaid Other | Admitting: Advanced Practice Midwife

## 2020-06-05 ENCOUNTER — Encounter: Payer: Self-pay | Admitting: Advanced Practice Midwife

## 2020-06-05 ENCOUNTER — Other Ambulatory Visit: Payer: Self-pay

## 2020-06-05 VITALS — BP 132/75 | HR 105 | Wt 140.0 lb

## 2020-06-05 DIAGNOSIS — O0991 Supervision of high risk pregnancy, unspecified, first trimester: Secondary | ICD-10-CM

## 2020-06-05 DIAGNOSIS — K5901 Slow transit constipation: Secondary | ICD-10-CM

## 2020-06-05 DIAGNOSIS — Z3A14 14 weeks gestation of pregnancy: Secondary | ICD-10-CM

## 2020-06-05 DIAGNOSIS — O0990 Supervision of high risk pregnancy, unspecified, unspecified trimester: Secondary | ICD-10-CM

## 2020-06-05 DIAGNOSIS — O30041 Twin pregnancy, dichorionic/diamniotic, first trimester: Secondary | ICD-10-CM

## 2020-06-05 DIAGNOSIS — Z1379 Encounter for other screening for genetic and chromosomal anomalies: Secondary | ICD-10-CM

## 2020-06-05 MED ORDER — PANTOPRAZOLE SODIUM 40 MG PO TBEC: 40.0000 mg | DELAYED_RELEASE_TABLET | Freq: Every day | ORAL | 1 refills | Status: DC

## 2020-06-05 MED ORDER — POLYETHYLENE GLYCOL 3350 17 G PO PACK: 17.0000 g | PACK | Freq: Every day | ORAL | 11 refills | Status: DC

## 2020-06-05 NOTE — Progress Notes (Signed)
   PRENATAL VISIT NOTE  Subjective:  Alexis [name] is a 19 y.o. G1P0000 at [redacted]w[redacted]d being seen today for ongoing prenatal care.  She is currently monitored for the following issues for this high-risk pregnancy and has GERD (gastroesophageal reflux disease); Dichorionic diamniotic twin pregnancy in first trimester; and High-risk pregnancy, first trimester on their problem list.  Patient reports constipation. Recommend Miralax and fiber..Still has some reflux.  Contractions: Not present. Vag. Bleeding: None.  Movement: Present. Denies leaking of fluid.   The following portions of the patient's history were reviewed and updated as appropriate: allergies, current medications, past family history, past medical history, past social history, past surgical history and problem list.   Objective:   Vitals:   06/05/20 1105  BP: 132/75  Pulse: (!) 105  Weight: 140 lb (63.5 kg)    Fetal Status: Fetal Heart Rate (bpm): 158/150 Fundal Height: 15 cm Movement: Present     General:  Alert, oriented and cooperative. Patient is in no acute distress.  Skin: Skin is warm and dry. No rash noted.   Cardiovascular: Normal heart rate noted  Respiratory: Normal respiratory effort, no problems with respiration noted  Abdomen: Soft, gravid, appropriate for gestational age.  Pain/Pressure: Present     Pelvic: Cervical exam deferred        Extremities: Normal range of motion.  Edema: None  Mental Status: Normal mood and affect. Normal behavior. Normal judgment and thought content.   Assessment and Plan:  Pregnancy: G1P0000 at [redacted]w[redacted]d 1. High-risk pregnancy, first trimester Indications for ASA therapy (per uptodate)  Recommend starting  One of the following: Previous pregnancy with preeclampsia, especially early onset and with an adverse outcome  Multifetal gestation    Two or more of the following: Nulliparity  Sociodemographic characteristics (African American race, low socioeconomic level)    2.  Dichorionic diamniotic twin pregnancy in first trimester      Reordered materni T21      Will schedule [place] at MFM       Discussed increased surveillance  - MaterniT21  plus Core+ESS+SCA, Blood - [place] MFM OB DETAIL +14 WK; Future - [place] MFM OB DETAIL ADDL GEST +14 WK; Future  3. Encounter for genetic screening for Down Syndrome  - MaterniT21  plus Core+ESS+SCA, Blood  4. [redacted] weeks gestation of pregnancy      Reviewed our practice routines      Reviewed MAU and hospital location, MFM location - MaterniT21  plus Core+ESS+SCA, Blood  5. Supervision of high risk pregnancy, antepartum      Lives in [place], went to [place] because they were her GYN before. Wants to deliver at [facility]  - [place] MFM OB DETAIL +14 WK; Future - [place] MFM OB DETAIL ADDL GEST +14 WK; Future  6. Slow transit constipation  - polyethylene glycol (MIRALAX) 17 g packet; Take 17 g by mouth daily.  Dispense: 30 each; Refill: 11  Preterm labor symptoms and general obstetric precautions including but not limited to vaginal bleeding, contractions, leaking of fluid and fetal movement were reviewed in detail with the patient. Please refer to After Visit Summary for other counseling recommendations.   Return in about 4 weeks (around 07/03/2020) for [facility].  Future Appointments  Date Time Provider Department Center  07/04/2020  8:45 AM Jerene Bears, MD [facility] None  07/10/2020  8:45 AM [facility] NURSE [facility]  07/10/2020  9:00 AM [facility] US1 [facility]    Wynelle Bourgeois, CNM

## 2020-06-05 NOTE — Progress Notes (Signed)
Patient requests repeat of maternity 21 since hers at [facility] was cancelled.

## 2020-06-05 NOTE — Patient Instructions (Signed)

## 2020-06-16 DIAGNOSIS — Z8759 Personal history of other complications of pregnancy, childbirth and the puerperium: Secondary | ICD-10-CM

## 2020-06-16 DIAGNOSIS — O09299 Supervision of pregnancy with other poor reproductive or obstetric history, unspecified trimester: Secondary | ICD-10-CM

## 2020-06-16 HISTORY — DX: Personal history of other complications of pregnancy, childbirth and the puerperium: Z87.59

## 2020-06-16 HISTORY — DX: Supervision of pregnancy with other poor reproductive or obstetric history, unspecified trimester: O09.299

## 2020-06-16 NOTE — L&D Delivery Note (Signed)
OB/GYN Faculty Practice Delivery Note  Alexis [name] is a 20 y.o. G1P0000 s/p vaginal delivery of didi twins at [redacted]w[redacted]d. She was admitted for IOL secondary to [facility] with severe superimposed preeclampsia.   ROM: 5h 85m with clear fluid (Twin A); 15m with clear fluid (Twin B) GBS Status: negative Maximum Maternal Temperature: 98.75F  Labor Progress: On admission, pt was started on magnesium infusion for treatment of severe preeclampsia. Cytotec was administered and FB was placed. Pt was then transitioned to pitocin. AROM for Twin A was performed for clear fluid at 0438 on 5/19. Pt was noted to have complete cervical dilation at 0842. She then had an uncomplicated delivery of didi twins as noted below.  Twin A Delivery Date/Time: 11/01/20 at 1010 Twin B Delivery Date/Time: 11/01/20 at 1026  Delivery: Called to room and patient was complete and pushing. Twin A fetal head delivered ROA. No nuchal cord present. Shoulder and body delivered in usual fashion. Infant with spontaneous cry, placed on mother's abdomen, dried and stimulated. Cord clamped x 2 after 30 second delay, and cut by FOB under my direct supervision. Cord blood drawn. Fetal presentation of Twin B then confirmed vertex on bedside ultrasound. Given low fetal station of Twin B, AROM performed for clear fluid. After brief period of pushing, Twin B fetal head delivered LOA. No nuchal cord present. Shoulder and body delivered in usual fashion. Given initial Apgar, cord was immediately clamped and cut. Twin B was immediately brought to warmer and NICU team was immediately called. Venous cord gas was collected (pH 7.35).  Placenta delivered spontaneously with gentle cord traction. Fundal massage performed and Pitocin was started. TXA was also administered. Labia, perineum, vagina, and cervix were inspected, notable for 2nd degree perineal laceration, left labial laceration and left periurethral lacerations s/p repair in standard fashion with use of  3-0 and 4-0 vicryl sutures. Given notable uterine atony with ongoing bleeding from above, lower uterine sweep was performed x2, productive of multiple large clots. Foley catheter was replaced. Hemabate was administered in addition to postpartum pitocin and TXA. Firm uterine fundus and minimal vaginal bleeding s/p interventions as noted above.  Placenta: intact, 3-vessel cord x2, sent to pathology  Complications: none Lacerations: 2nd degree perineal laceration, left labial, left periurethral EBL: 1274 ml  Analgesia: epidural  Infants:  Twin A: viable female  APGARs 8 & 9  weight 2385 g Twin B: viable female  APGARs 4 & 8  weight 2275 g  Venous cord gas pH 7.35 (Twin B)  Lynnda Shields, MD OB/GYN Fellow, Faculty Practice

## 2020-06-18 ENCOUNTER — Telehealth: Payer: Self-pay

## 2020-06-18 LAB — MATERNIT21  PLUS CORE+ESS, BLOOD
11q23 deletion (Jacobsen): NOT DETECTED
15q11 deletion (PW Angelman): NOT DETECTED
1p36 deletion syndrome: NOT DETECTED
22q11 deletion (DiGeorge): NOT DETECTED
4p16 deletion(Wolf-Hirschhorn): NOT DETECTED
5p15 deletion (Cri-du-chat): NOT DETECTED
8q24 deletion (Langer-Giedion): NOT DETECTED
Fetal Fraction: 13
Result (T21): NEGATIVE
Trisomy 13 (Patau syndrome): NEGATIVE
Trisomy 16: NOT DETECTED
Trisomy 18 (Edwards syndrome): NEGATIVE
Trisomy 21 (Down syndrome): NEGATIVE
Trisomy 22: NOT DETECTED

## 2020-06-18 LAB — SPECIMEN STATUS REPORT

## 2020-06-18 NOTE — Telephone Encounter (Signed)
-----   Message from [name], [place] sent at 06/18/2020 10:19 AM EST ----- Regarding: Results Would like test results from 06/05/2020 and would like a call back.

## 2020-06-18 NOTE — Telephone Encounter (Signed)
Patient called about Maternity 21 results. Called [facility] because not resulted yet and they do have a Maternity 21 results from her Consuella Lose (even though it says cancelled in Epic).  Results scanned in under media. Patient has a normal Maternity 21. Armandina Stammer RN

## 2020-06-20 ENCOUNTER — Telehealth: Payer: Self-pay

## 2020-06-20 ENCOUNTER — Inpatient Hospital Stay (HOSPITAL_COMMUNITY)
Admission: AD | Admit: 2020-06-20 | Discharge: 2020-06-21 | Disposition: A | Discharge status: Home / Self Care | Payer: Medicaid Other | Attending: Obstetrics & Gynecology | Admitting: Obstetrics & Gynecology

## 2020-06-20 ENCOUNTER — Other Ambulatory Visit: Payer: Self-pay

## 2020-06-20 DIAGNOSIS — R109 Unspecified abdominal pain: Secondary | ICD-10-CM

## 2020-06-20 DIAGNOSIS — O30041 Twin pregnancy, dichorionic/diamniotic, first trimester: Secondary | ICD-10-CM | POA: Insufficient documentation

## 2020-06-20 DIAGNOSIS — O0991 Supervision of high risk pregnancy, unspecified, first trimester: Secondary | ICD-10-CM | POA: Insufficient documentation

## 2020-06-20 DIAGNOSIS — O26899 Other specified pregnancy related conditions, unspecified trimester: Secondary | ICD-10-CM

## 2020-06-20 DIAGNOSIS — O26892 Other specified pregnancy related conditions, second trimester: Secondary | ICD-10-CM | POA: Insufficient documentation

## 2020-06-20 DIAGNOSIS — Z3A17 17 weeks gestation of pregnancy: Secondary | ICD-10-CM | POA: Insufficient documentation

## 2020-06-20 LAB — URINALYSIS, ROUTINE W REFLEX MICROSCOPIC
Bilirubin Urine: NEGATIVE
Glucose, UA: NEGATIVE mg/dL
Hgb urine dipstick: NEGATIVE
Ketones, ur: NEGATIVE mg/dL
Leukocytes,Ua: NEGATIVE
Nitrite: NEGATIVE
Protein, ur: NEGATIVE mg/dL
Specific Gravity, Urine: 1.017 (ref 1.005–1.030)
pH: 6 (ref 5.0–8.0)

## 2020-06-20 NOTE — Telephone Encounter (Signed)
Patient called stating that she is [redacted] weeks pregnant. She is pregnant with Didi twins and is having lower abdominal cramping that radiates to her back. Patient advised to hydrate well and if they continue to seek care at Maternity admission unit at [facility].  Patient states she doesn't think they are back enough to warrant a visit there. Denies any bleeding at this time.   Patient given appointment for tomorrow at 330pm. Armandina Stammer RN

## 2020-06-20 NOTE — Telephone Encounter (Signed)
-----   Message from [name], [place] sent at 06/20/2020  4:01 PM EST ----- Regarding: Questions Cramping in lower abdomen and lower back.  17 weeks with twins.  Has some questions and would like a call back.

## 2020-06-20 NOTE — MAU Note (Signed)
Having cramping in lower abdomen since yesterday. Pain radiates to lower back. Denies VB or d/c. Pain is worse laying down

## 2020-06-21 ENCOUNTER — Other Ambulatory Visit: Payer: Self-pay

## 2020-06-21 ENCOUNTER — Ambulatory Visit (INDEPENDENT_AMBULATORY_CARE_PROVIDER_SITE_OTHER): Payer: Medicaid Other | Admitting: Family Medicine

## 2020-06-21 ENCOUNTER — Encounter (HOSPITAL_COMMUNITY): Payer: Self-pay | Admitting: Obstetrics & Gynecology

## 2020-06-21 VITALS — BP 114/69 | HR 88 | Wt 145.1 lb

## 2020-06-21 DIAGNOSIS — M9903 Segmental and somatic dysfunction of lumbar region: Secondary | ICD-10-CM

## 2020-06-21 DIAGNOSIS — O30042 Twin pregnancy, dichorionic/diamniotic, second trimester: Secondary | ICD-10-CM | POA: Diagnosis not present

## 2020-06-21 DIAGNOSIS — O0991 Supervision of high risk pregnancy, unspecified, first trimester: Secondary | ICD-10-CM | POA: Diagnosis not present

## 2020-06-21 DIAGNOSIS — R109 Unspecified abdominal pain: Secondary | ICD-10-CM

## 2020-06-21 DIAGNOSIS — O30041 Twin pregnancy, dichorionic/diamniotic, first trimester: Secondary | ICD-10-CM

## 2020-06-21 DIAGNOSIS — M9904 Segmental and somatic dysfunction of sacral region: Secondary | ICD-10-CM

## 2020-06-21 DIAGNOSIS — N949 Unspecified condition associated with female genital organs and menstrual cycle: Secondary | ICD-10-CM

## 2020-06-21 DIAGNOSIS — O26892 Other specified pregnancy related conditions, second trimester: Secondary | ICD-10-CM | POA: Diagnosis not present

## 2020-06-21 DIAGNOSIS — M9905 Segmental and somatic dysfunction of pelvic region: Secondary | ICD-10-CM

## 2020-06-21 DIAGNOSIS — O99891 Other specified diseases and conditions complicating pregnancy: Secondary | ICD-10-CM

## 2020-06-21 DIAGNOSIS — Z3A17 17 weeks gestation of pregnancy: Secondary | ICD-10-CM

## 2020-06-21 DIAGNOSIS — M549 Dorsalgia, unspecified: Secondary | ICD-10-CM

## 2020-06-21 LAB — MATERNIT21  PLUS CORE+ESS+SCA, BLOOD

## 2020-06-21 MED ORDER — IBUPROFEN 600 MG PO TABS
600.0000 mg | ORAL_TABLET | Freq: Once | ORAL | Status: AC
  Administered 2020-06-21: 600 mg via ORAL
  Filled 2020-06-21: qty 1

## 2020-06-21 NOTE — Progress Notes (Signed)
   PRENATAL VISIT NOTE  Subjective:  Alexis [name] is a 20 y.o. G1P0000 at [redacted]w[redacted]d being seen today for ongoing prenatal care.  She is currently monitored for the following issues for this high-risk pregnancy and has GERD (gastroesophageal reflux disease); Dichorionic diamniotic twin pregnancy in first trimester; and High-risk pregnancy, first trimester on their problem list.  Patient reports pelvic pain intermittent. Mostly at night when sleeping. Also has some back pain on left that radiates down into leg. Pain intermittent.  Contractions: Not present. Vag. Bleeding: None.  Movement: Present. Denies leaking of fluid.   The following portions of the patient's history were reviewed and updated as appropriate: allergies, current medications, past family history, past medical history, past social history, past surgical history and problem list.   Objective:   Vitals:   06/21/20 1525  BP: 114/69  Pulse: 88  Weight: 145 lb 1.3 oz (65.8 kg)    Fetal Status: Fetal Heart Rate (bpm): 151/159   Movement: Present     General:  Alert, oriented and cooperative. Patient is in no acute distress.  Skin: Skin is warm and dry. No rash noted.   Cardiovascular: Normal heart rate noted  Respiratory: Normal respiratory effort, no problems with respiration noted  Abdomen: Soft, gravid, appropriate for gestational age.  Pain/Pressure: Present   Pain in round ligamenths bilaterally  Pelvic: Cervical exam deferred        Extremities: Normal range of motion.  Edema: None  Mental Status: Normal mood and affect. Normal behavior. Normal judgment and thought content.   MSK: Restriction, tenderness, tissue texture changes, and paraspinal spasm in the lumbar spine  Neuro: Moves all four extremities with no focal neurological deficit   OSE: Head   Cervical   Thoracic   Rib   Lumbar L5 ESRL  Sacrum L/L  Pelvis Right ant     Assessment and Plan:  Pregnancy: G1P0000 at [redacted]w[redacted]d 1. High-risk pregnancy, first  trimester  2. Dichorionic diamniotic twin pregnancy in first trimester FHT and FH normal  3. Round ligament pain Discussed symptomatic treatment  4. Back pain affecting pregnancy in second trimester 5. Somatic dysfunction of lumbar region 6. Somatic dysfunction of pelvis region 7. Somatic dysfunction of sacral region OMT done after patient permission. HVLA technique utilized. 3 areas treated with improvement of tissue texture and joint mobility. Patient tolerated procedure well.    Preterm labor symptoms and general obstetric precautions including but not limited to vaginal bleeding, contractions, leaking of fluid and fetal movement were reviewed in detail with the patient. Please refer to After Visit Summary for other counseling recommendations.   Return in about 4 weeks (around 07/19/2020).  Future Appointments  Date Time Provider Department Center  07/04/2020  8:45 AM Jerene Bears, MD [facility] None  07/10/2020  8:45 AM [facility] NURSE [facility]  07/10/2020  9:00 AM [facility] US1 [facility]    [name], DO

## 2020-06-21 NOTE — MAU Note (Signed)
Patient reports to triage with complaints of lower abdomin pain that radiates to her back intermittently. Patient reports a pain score of 6/10. Patient denies leaking of fluid and bleeding. Triage assessment completed and provider to be notified

## 2020-06-21 NOTE — MAU Provider Note (Signed)
Chief Complaint:  Abdominal Pain and Back Pain   Event Date/Time   First Provider Initiated Contact with Patient 06/21/20 0125     HPI: Alexis [name] is a 20 y.o. G1P0000 at 71w1dwho presents to maternity admissions reporting intermittent pelvic cramping.  Sometimes shoots through to her back   Has an appointment this afternoon but decided to come in now. . She reports good fetal movement, denies LOF, vaginal bleeding, vaginal itching/burning, urinary symptoms, h/a, dizziness, n/v, diarrhea, constipation or fever/chills.    Abdominal Pain This is a new problem. The current episode started today. The onset quality is gradual. The problem occurs intermittently. The problem has been unchanged. The pain is located in the suprapubic region, LLQ and RLQ. The pain is mild. The quality of the pain is cramping. The abdominal pain radiates to the back. Pertinent negatives include no constipation, diarrhea, dysuria, fever, frequency, headaches, myalgias, nausea or vomiting. Nothing aggravates the pain. The pain is relieved by nothing. She has tried nothing for the symptoms.  Back Pain Associated symptoms include abdominal pain. Pertinent negatives include no dysuria, fever or headaches.    RN Note: Patient reports to triage with complaints of lower abdomin pain that radiates to her back intermittently. Patient reports a pain score of 6/10. Patient denies leaking of fluid and bleeding. Triage assessment completed and provider to be notified  Past Medical History: Past Medical History:  Diagnosis Date  . GERD (gastroesophageal reflux disease)   . Tachycardia    no medications-intermittent    Past obstetric history: OB History  Gravida Para Term Preterm AB Living  1 0 0 0 0 0  SAB IAB Ectopic Multiple Live Births  0 0 0 0 0    # Outcome Date GA Lbr Len/2nd Weight Sex Delivery Anes PTL Lv  1 Current             Past Surgical History: Past Surgical History:  Procedure Laterality Date  . NO  PAST SURGERIES      Family History: Family History  Problem Relation Age of Onset  . Diabetes Mellitus II Mother   . Hypertension Mother   . Hypertension Father   . Stomach cancer Maternal Aunt   . Stomach cancer Paternal Grandmother     Social History: Social History   Tobacco Use  . Smoking status: Never Smoker  . Smokeless tobacco: Never Used  Vaping Use  . Vaping Use: Never used  Substance Use Topics  . Alcohol use: Not Currently  . Drug use: Not Currently    Allergies:  Allergies  Allergen Reactions  . Dust Mite Extract Hives  . Mixed Ragweed Hives    Meds:  Medications Prior to Admission  Medication Sig Dispense Refill Last Dose  . cetirizine (ZYRTEC) 10 MG tablet Take 10 mg by mouth daily.   Past Week at Unknown time  . Doxylamine-Pyridoxine (DICLEGIS) 10-10 MG TBEC Take 2 tablets by mouth at bedtime. If symptoms persist, add one tablet in the morning and one in the afternoon 100 tablet 5 06/20/2020 at Unknown time  . pantoprazole (PROTONIX) 40 MG tablet Take 1 tablet (40 mg total) by mouth daily. 30 tablet 1 06/20/2020 at Unknown time  . polyethylene glycol (MIRALAX) 17 g packet Take 17 g by mouth daily. 30 each 11 Past Month at Unknown time  . Prenatal Vit-Fe Fumarate-FA (PRENATAL VITAMINS PO) Take 1 tablet by mouth daily.   06/20/2020 at Unknown time  . promethazine (PHENERGAN) 12.5 MG suppository Place 1 suppository (12.5  mg total) rectally every 6 (six) hours as needed for nausea or vomiting. (Patient not taking: No sig reported) 12 each 1   . sucralfate (CARAFATE) 1 g tablet Take 1 tablet (1 g total) by mouth 4 (four) times daily -  with meals and at bedtime. (Patient not taking: Reported on 06/21/2020) 120 tablet 6 Not Taking at Unknown time    I have reviewed patient's Past Medical Hx, Surgical Hx, Family Hx, Social Hx, medications and allergies.   ROS:  Review of Systems  Constitutional: Negative for fever.  Gastrointestinal: Positive for abdominal pain.  Negative for constipation, diarrhea, nausea and vomiting.  Genitourinary: Negative for dysuria and frequency.  Musculoskeletal: Positive for back pain. Negative for myalgias.  Neurological: Negative for headaches.   Other systems negative  Physical Exam   Patient Vitals for the past 24 hrs:  BP Temp Temp src Pulse Resp Height Weight  06/21/20 0116 (!) 142/74 98.6 F (37 C) Oral 91 18 -- --  06/20/20 2023 136/72 99 F (37.2 C) -- 96 16 5\' 4"  (1.626 m) 66.2 kg   Constitutional: Well-developed, well-nourished female in no acute distress.  Cardiovascular: normal rate and rhythm Respiratory: normal effort, clear to auscultation bilaterally GI: Abd soft, non-tender, gravid appropriate for gestational age.   No rebound or guarding. MS: Extremities nontender, no edema, normal ROM Neurologic: Alert and oriented x 4.  GU: Neg CVAT.  PELVIC EXAM:   Cervix long and closed  FHT:  150/155   Labs: Results for orders placed or performed during the hospital encounter of 06/20/20 (from the past 24 hour(s))  Urinalysis, Routine w reflex microscopic Urine, Clean Catch     Status: Abnormal   Collection Time: 06/20/20  8:29 PM  Result Value Ref Range   Color, Urine YELLOW YELLOW   APPearance HAZY (A) CLEAR   Specific Gravity, Urine 1.017 1.005 - 1.030   pH 6.0 5.0 - 8.0   Glucose, UA NEGATIVE NEGATIVE mg/dL   Hgb urine dipstick NEGATIVE NEGATIVE   Bilirubin Urine NEGATIVE NEGATIVE   Ketones, ur NEGATIVE NEGATIVE mg/dL   Protein, ur NEGATIVE NEGATIVE mg/dL   Nitrite NEGATIVE NEGATIVE   Leukocytes,Ua NEGATIVE NEGATIVE   O/Positive/-- (10/25 1417)  Imaging:  No results found.  MAU Course/MDM: I have ordered labs and reviewed results. Urine is clear Discussed this is likely uterus cramping due to rapid growth at this stage with twins.  Closed cervix is reassuring Will try ibuprofen   Treatments in MAU included ibuprofen which entirely relieved pain.    Assessment: 1. High-risk  pregnancy, first trimester   2. Dichorionic diamniotic twin pregnancy in first trimester   3.     Second trimester cramping  Plan: Discharge home Preterm Labor precautions and fetal kick counts Follow up in Office for prenatal visits  Encouraged to return if she develops worsening of symptoms, increase in pain, fever, or other concerning symptoms.  Pt stable at time of discharge.  08-16-1982 CNM, MSN Certified Nurse-Midwife 06/21/2020 1:25 AM

## 2020-06-21 NOTE — Discharge Instructions (Signed)

## 2020-06-25 ENCOUNTER — Other Ambulatory Visit: Payer: Self-pay

## 2020-06-25 ENCOUNTER — Inpatient Hospital Stay (HOSPITAL_COMMUNITY)
Admission: AD | Admit: 2020-06-25 | Discharge: 2020-06-26 | Disposition: A | Discharge status: Home / Self Care | Payer: Medicaid Other | Attending: Obstetrics and Gynecology | Admitting: Obstetrics and Gynecology

## 2020-06-25 DIAGNOSIS — O98512 Other viral diseases complicating pregnancy, second trimester: Secondary | ICD-10-CM | POA: Insufficient documentation

## 2020-06-25 DIAGNOSIS — U071 COVID-19: Secondary | ICD-10-CM | POA: Insufficient documentation

## 2020-06-25 DIAGNOSIS — Z3A17 17 weeks gestation of pregnancy: Secondary | ICD-10-CM

## 2020-06-25 DIAGNOSIS — Z20822 Contact with and (suspected) exposure to covid-19: Secondary | ICD-10-CM

## 2020-06-26 DIAGNOSIS — U071 COVID-19: Secondary | ICD-10-CM

## 2020-06-26 DIAGNOSIS — O98512 Other viral diseases complicating pregnancy, second trimester: Secondary | ICD-10-CM

## 2020-06-26 DIAGNOSIS — Z3A17 17 weeks gestation of pregnancy: Secondary | ICD-10-CM | POA: Diagnosis not present

## 2020-06-26 DIAGNOSIS — O26892 Other specified pregnancy related conditions, second trimester: Secondary | ICD-10-CM | POA: Diagnosis present

## 2020-06-26 LAB — SARS CORONAVIRUS 2 (TAT 6-24 HRS): SARS Coronavirus 2: POSITIVE — AB

## 2020-06-26 NOTE — MAU Note (Signed)
PT SAYS SHE WOKE THIS AM WITH SORE THROAT AND TEMP- 99.8.     THEN TEMP WAS 100.8.   IS VACCINATED- IN April.

## 2020-06-26 NOTE — MAU Provider Note (Signed)
Alexis [name] is a 20 y.o. G1P0000 patient who presents to MAU today with complaint of sore throat, fever, cough and back pain. She is vaccinated for COVID-19 but has no known contacts.    O LMP 02/22/2020 (Exact Date)  Physical Exam Vitals and nursing note reviewed.  Constitutional:      General: She is not in acute distress.    Appearance: She is well-developed and well-nourished.  HENT:     Head: Normocephalic.  Eyes:     Pupils: Pupils are equal, round, and reactive to light.  Cardiovascular:     Rate and Rhythm: Normal rate and regular rhythm.     Heart sounds: Normal heart sounds.  Pulmonary:     Effort: Pulmonary effort is normal. No respiratory distress.     Breath sounds: Normal breath sounds.  Abdominal:     General: Bowel sounds are normal. There is no distension.     Palpations: Abdomen is soft.     Tenderness: There is no abdominal tenderness.  Skin:    General: Skin is warm and dry.  Neurological:     Mental Status: She is alert and oriented to person, place, and time.  Psychiatric:        Mood and Affect: Mood and affect normal.        Behavior: Behavior normal.        Thought Content: Thought content normal.        Judgment: Judgment normal.    FHR:   Baby A: 150 bpm    Baby B: 161 bpm  A Medical screening exam complete 1. Suspected COVID-19 virus infection   2. [redacted] weeks gestation of pregnancy    -COVID test. Will call patient with results and will be available in MyChart  P Discharge from MAU in stable condition List of options for follow-up given  Warning signs for worsening condition that would warrant emergency follow-up discussed Patient may return to MAU as needed   Rolm Bookbinder, [place] 06/26/2020 12:34 AM

## 2020-06-26 NOTE — Discharge Instructions (Signed)
Safe Medications in Pregnancy   Acne: Benzoyl Peroxide Salicylic Acid  Backache/Headache: Tylenol: 2 regular strength every 4 hours OR              2 Extra strength every 6 hours  Colds/Coughs/Allergies: Benadryl (alcohol free) 25 mg every 6 hours as needed Breath right strips Claritin Cepacol throat lozenges Chloraseptic throat spray Cold-Eeze- up to three times per day Cough drops, alcohol free Flonase (by prescription only) Guaifenesin Mucinex Robitussin DM (plain only, alcohol free) Saline nasal spray/drops Sudafed (pseudoephedrine) & Actifed ** use only after [redacted] weeks gestation and if you do not have high blood pressure Tylenol Vicks Vaporub Zinc lozenges Zyrtec   Constipation: Colace Ducolax suppositories Fleet enema Glycerin suppositories Metamucil Milk of magnesia Miralax Senokot Smooth move tea  Diarrhea: Kaopectate Imodium A-D  *NO pepto Bismol  Hemorrhoids: Anusol Anusol HC Preparation H Tucks  Indigestion: Tums Maalox Mylanta Zantac  Pepcid  Insomnia: Benadryl (alcohol free) 25mg every 6 hours as needed Tylenol PM Unisom, no Gelcaps  Leg Cramps: Tums MagGel  Nausea/Vomiting:  Bonine Dramamine Emetrol Ginger extract Sea bands Meclizine  Nausea medication to take during pregnancy:  Unisom (doxylamine succinate 25 mg tablets) Take one tablet daily at bedtime. If symptoms are not adequately controlled, the dose can be increased to a maximum recommended dose of two tablets daily (1/2 tablet in the morning, 1/2 tablet mid-afternoon and one at bedtime). Vitamin B6 100mg tablets. Take one tablet twice a day (up to 200 mg per day).  Skin Rashes: Aveeno products Benadryl cream or 25mg every 6 hours as needed Calamine Lotion 1% cortisone cream  Yeast infection: Gyne-lotrimin 7 Monistat 7   **If taking multiple medications, please check labels to avoid duplicating the same active ingredients **take medication as directed on  the label ** Do not exceed 4000 mg of tylenol in 24 hours **Do not take medications that contain aspirin or ibuprofen     10 Things You Can Do to Manage Your COVID-19 Symptoms at Home If you have possible or confirmed COVID-19: 1. Stay home except to get medical care. 2. Monitor your symptoms carefully. If your symptoms get worse, call your healthcare provider immediately. 3. Get rest and stay hydrated. 4. If you have a medical appointment, call the healthcare provider ahead of time and tell them that you have or may have COVID-19. 5. For medical emergencies, call 911 and notify the dispatch personnel that you have or may have COVID-19. 6. Cover your cough and sneezes with a tissue or use the inside of your elbow. 7. Wash your hands often with soap and water for at least 20 seconds or clean your hands with an alcohol-based hand sanitizer that contains at least 60% alcohol. 8. As much as possible, stay in a specific room and away from other people in your home. Also, you should use a separate bathroom, if available. If you need to be around other people in or outside of the home, wear a mask. 9. Avoid sharing personal items with other people in your household, like dishes, towels, and bedding. 10. Clean all surfaces that are touched often, like counters, tabletops, and doorknobs. Use household cleaning sprays or wipes according to the label instructions. cdc.gov/coronavirus 12/30/2019 This information is not intended to replace advice given to you by your health care provider. Make sure you discuss any questions you have with your health care provider. Document Revised: 04/16/2020 Document Reviewed: 04/16/2020 Elsevier Patient Education  2021 Elsevier Inc.  

## 2020-06-29 MED ORDER — FLUTICASONE PROPIONATE 50 MCG/ACT NA SUSP: 1.0000 | Freq: Two times a day (BID) | NASAL | 2 refills | Status: DC

## 2020-06-29 NOTE — Addendum Note (Signed)
Addended by: [name] on: 06/29/2020 08:16 AM   Modules accepted: Orders

## 2020-07-04 ENCOUNTER — Encounter: Payer: Medicaid Other | Admitting: Obstetrics & Gynecology

## 2020-07-10 ENCOUNTER — Other Ambulatory Visit: Payer: Self-pay

## 2020-07-10 ENCOUNTER — Encounter: Payer: Self-pay | Admitting: *Deleted

## 2020-07-10 ENCOUNTER — Ambulatory Visit: Payer: Medicaid Other | Attending: Advanced Practice Midwife

## 2020-07-10 ENCOUNTER — Ambulatory Visit: Payer: Medicaid Other | Admitting: *Deleted

## 2020-07-10 ENCOUNTER — Other Ambulatory Visit: Payer: Self-pay | Admitting: *Deleted

## 2020-07-10 DIAGNOSIS — O0991 Supervision of high risk pregnancy, unspecified, first trimester: Secondary | ICD-10-CM | POA: Insufficient documentation

## 2020-07-10 DIAGNOSIS — O30042 Twin pregnancy, dichorionic/diamniotic, second trimester: Secondary | ICD-10-CM

## 2020-07-10 DIAGNOSIS — O0990 Supervision of high risk pregnancy, unspecified, unspecified trimester: Secondary | ICD-10-CM | POA: Insufficient documentation

## 2020-07-10 DIAGNOSIS — O30041 Twin pregnancy, dichorionic/diamniotic, first trimester: Secondary | ICD-10-CM

## 2020-07-12 ENCOUNTER — Other Ambulatory Visit: Payer: Self-pay

## 2020-07-12 ENCOUNTER — Ambulatory Visit (INDEPENDENT_AMBULATORY_CARE_PROVIDER_SITE_OTHER): Payer: Medicaid Other | Admitting: Family Medicine

## 2020-07-12 VITALS — BP 137/87 | HR 100 | Wt 152.0 lb

## 2020-07-12 DIAGNOSIS — U071 COVID-19: Secondary | ICD-10-CM

## 2020-07-12 DIAGNOSIS — H9202 Otalgia, left ear: Secondary | ICD-10-CM

## 2020-07-12 DIAGNOSIS — O98512 Other viral diseases complicating pregnancy, second trimester: Secondary | ICD-10-CM

## 2020-07-12 DIAGNOSIS — O30041 Twin pregnancy, dichorionic/diamniotic, first trimester: Secondary | ICD-10-CM

## 2020-07-12 DIAGNOSIS — O0991 Supervision of high risk pregnancy, unspecified, first trimester: Secondary | ICD-10-CM

## 2020-07-12 MED ORDER — ENSURE HEALTHY MOM PO LIQD: 1.0000 | Freq: Every day | ORAL | 11 refills | Status: AC

## 2020-07-12 NOTE — Progress Notes (Signed)
Patient complaining of left ear pain. Patient had covid on 06-26-20.

## 2020-07-12 NOTE — Progress Notes (Signed)
   PRENATAL VISIT NOTE  Subjective:  Alexis [name] is a 20 y.o. G1P0000 at [redacted]w[redacted]d being seen today for ongoing prenatal care.  She is currently monitored for the following issues for this high-risk pregnancy and has GERD (gastroesophageal reflux disease); Dichorionic diamniotic twin pregnancy in first trimester; and High-risk pregnancy, first trimester on their problem list.  Patient reports left ear pain, intermittent over past couple of weeks. Had COVID, still having nasal congestion.  Contractions: Not present. Vag. Bleeding: None.  Movement: Present. Denies leaking of fluid.   The following portions of the patient's history were reviewed and updated as appropriate: allergies, current medications, past family history, past medical history, past social history, past surgical history and problem list.   Objective:   Vitals:   07/12/20 0830  BP: 137/87  Pulse: 100  Weight: 152 lb (68.9 kg)    Fetal Status: Fetal Heart Rate (bpm): 151/145   Movement: Present     General:  Alert, oriented and cooperative. Patient is in no acute distress.  HEENT TMs normal  Skin: Skin is warm and dry. No rash noted.   Cardiovascular: Normal heart rate noted  Respiratory: Normal respiratory effort, no problems with respiration noted  Abdomen: Soft, gravid, appropriate for gestational age.  Pain/Pressure: Absent     Pelvic: Cervical exam deferred        Extremities: Normal range of motion.  Edema: None  Mental Status: Normal mood and affect. Normal behavior. Normal judgment and thought content.   Assessment and Plan:  Pregnancy: G1P0000 at [redacted]w[redacted]d 1. High-risk pregnancy, first trimester  2. Dichorionic diamniotic twin pregnancy in first trimester On ASA 81mg  Growth normal.  3. COVID-19 affecting pregnancy in second trimester Ear pain from nasal congestion. Continue antihistamine, flonase  4. Left ear pain   Preterm labor symptoms and general obstetric precautions including but not limited to  vaginal bleeding, contractions, leaking of fluid and fetal movement were reviewed in detail with the patient. Please refer to After Visit Summary for other counseling recommendations.   Return in about 4 weeks (around 08/09/2020) for OB f/u, In Office.  Future Appointments  Date Time Provider Department Center  08/08/2020 10:00 AM [facility] NURSE [facility]  08/08/2020 10:15 AM [facility]  08/09/2020  8:45 AM 08/11/2020, DO [facility] None  09/06/2020  8:15 AM 09/08/2020 Adrian Blackwater, DO [facility] None    Rhona Raider, DO

## 2020-07-17 ENCOUNTER — Ambulatory Visit: Payer: Medicaid Other | Attending: Internal Medicine

## 2020-07-17 ENCOUNTER — Other Ambulatory Visit (HOSPITAL_BASED_OUTPATIENT_CLINIC_OR_DEPARTMENT_OTHER): Payer: Self-pay | Admitting: Internal Medicine

## 2020-07-17 DIAGNOSIS — Z23 Encounter for immunization: Secondary | ICD-10-CM

## 2020-07-17 MED FILL — PFIZER-BIONTECH COVID-19 VA: 30 | 21 days supply | Qty: 0 | Fill #0

## 2020-07-17 NOTE — Progress Notes (Signed)
   Covid-19 Vaccination Clinic  Name:  Alexis [name]    MRN: [id number] DOB: 2001-03-24  07/17/2020  [name] was observed post Covid-19 immunization for 15 minutes without incident. She was provided with Vaccine Information Sheet and instruction to access the V-Safe system. Vaccinated by [employer].  [name] was instructed to call 911 with any severe reactions post vaccine: Marland Kitchen Difficulty breathing  . Swelling of face and throat  . A fast heartbeat  . A bad rash all over body  . Dizziness and weakness   Immunizations Administered    Name Date Dose VIS Date Route   PFIZER Comrnaty(Gray TOP) Covid-19 Vaccine 07/17/2020  1:00 PM 0.3 mL 05/24/2020 Intramuscular   Manufacturer: [employer], [employer]   Lot: [id number]   NDC: [phone]

## 2020-07-18 ENCOUNTER — Encounter: Payer: Self-pay | Admitting: General Practice

## 2020-07-18 ENCOUNTER — Encounter: Payer: Medicaid Other | Admitting: Family Medicine

## 2020-07-20 ENCOUNTER — Other Ambulatory Visit: Payer: Self-pay

## 2020-07-20 ENCOUNTER — Ambulatory Visit (INDEPENDENT_AMBULATORY_CARE_PROVIDER_SITE_OTHER): Payer: Medicaid Other | Admitting: Obstetrics & Gynecology

## 2020-07-20 ENCOUNTER — Encounter: Payer: Self-pay | Admitting: Obstetrics & Gynecology

## 2020-07-20 VITALS — BP 121/70 | HR 95 | Wt 151.0 lb

## 2020-07-20 DIAGNOSIS — O30042 Twin pregnancy, dichorionic/diamniotic, second trimester: Secondary | ICD-10-CM

## 2020-07-20 DIAGNOSIS — Z3A21 21 weeks gestation of pregnancy: Secondary | ICD-10-CM

## 2020-07-20 DIAGNOSIS — O0992 Supervision of high risk pregnancy, unspecified, second trimester: Secondary | ICD-10-CM

## 2020-07-20 NOTE — Progress Notes (Signed)
PRENATAL VISIT NOTE  Subjective:  Allicyn Dorriety is a 20 y.o. G1P0 at [redacted]w[redacted]d being seen today for ongoing concern about one episode of upper abdominal pain/cramping yesterday.   Contractions: Irritability. Vag. Bleeding: None.  Movement: Present. Denies leaking of fluid.   She is currently monitored for the following issues for this high-risk pregnancy and has GERD (gastroesophageal reflux disease); Dichorionic diamniotic twin pregnancy in first trimester; and High-risk pregnancy, first trimester on their problem list.  The following portions of the patient's history were reviewed and updated as appropriate: allergies, current medications, past family history, past medical history, past social history, past surgical history and problem list.   Objective:   Vitals:   07/20/20 0944  BP: 121/70  Pulse: 95  Weight: 151 lb (68.5 kg)    Fetal Status: Fetal Heart Rate (bpm): 150/145 Fundal Height: 26 cm Movement: Present     General:  Alert, oriented and cooperative. Patient is in no acute distress.  Skin: Skin is warm and dry. No rash noted.   Cardiovascular: Normal heart rate noted  Respiratory: Normal respiratory effort, no problems with respiration noted  Abdomen: Soft, gravid, appropriate for gestational age.  Pain/Pressure: Absent     Pelvic: Cervical exam deferred        Extremities: Normal range of motion.  Edema: None  Mental Status: Normal mood and affect. Normal behavior. Normal judgment and thought content.    Imaging: [place] MFM OB DETAIL +14 WK  Result Date: 07/10/2020 ----------------------------------------------------------------------  OBSTETRICS REPORT                       (Signed Final 07/10/2020 11:16 am) ---------------------------------------------------------------------- Patient Info  ID #:       [id number]                          D.O.B.:  2001-03-14 (19 yrs)  Name:       Donia Pounds               Visit Date: 07/10/2020 08:44 am  ---------------------------------------------------------------------- Performed By  Attending:        Ma Rings MD         Secondary Phy.:   [facility]  Performed By:     Emeline Darling BS,      Address:          [address]                                                             [address]  Referred By:      Aviva Signs       [facility] ---------------------------------------------------------------------- Orders  #  Description                           Code        Ordered By  1  [place] MFM OB DETAIL +14 WK               [id number]    Wynelle Bourgeois  2  [place] MFM OB DETAIL ADDL GEST            16109.60    Wynelle Bourgeois     +14 WK ----------------------------------------------------------------------  #  Order #                     Accession #                Episode #  1  [id number]                   [id number]                 [id number]  2  [id number]                   [id number]                 [id number] ---------------------------------------------------------------------- Indications  Twin pregnancy, di/di, second trimester        O30.042  Fetal abnormality - other known or             O35.9XX0  suspected (ICEF twin B)  [redacted] weeks gestation of pregnancy                Z3A.19  Encounter for antenatal screening for          Z36.3  malformations ---------------------------------------------------------------------- Fetal Evaluation (Fetus A)  Num Of Fetuses:         2  Fetal Heart Rate(bpm):  153  Cardiac Activity:       Observed  Fetal Lie:              Maternal right side  Presentation:           Cephalic  Placenta:               Posterior  P. Cord Insertion:      Visualized  Membrane Desc:      Dividing Membrane seen - Dichorionic.  Amniotic Fluid   AFI FV:      Within normal limits                              Largest Pocket(cm)                              4.84 ---------------------------------------------------------------------- Biometry (Fetus A)  BPD:      49.4  mm     G. Age:  21w 0d         88  %    CI:        80.67   %  70 - 86                                                          FL/HC:      18.0   %    16.8 - 19.8  HC:      173.7  mm     G. Age:  19w 6d         45  %    HC/AC:      1.12        1.09 - 1.39  AC:      154.8  mm     G. Age:  20w 5d         71  %    FL/BPD:     63.2   %  FL:       31.2  mm     G. Age:  19w 5d         36  %    FL/AC:      20.2   %    20 - 24  HUM:      32.6  mm     G. Age:  21w 0d         82  %  CER:      21.2  mm     G. Age:  20w 1d         79  %  NFT:       4.9  mm  CM:        6.3  mm  Est. FW:     339  gm    0 lb 12 oz      66  %     FW Discordancy      0 \ 4 % ---------------------------------------------------------------------- OB History  Gravidity:    1 ---------------------------------------------------------------------- Gestational Age (Fetus A)  LMP:           19w 6d        Date:  02/22/20                 EDD:   11/28/20  U/S Today:     20w 2d                                        EDD:   11/25/20  Best:          19w 6d     Det. By:  LMP  (02/22/20)          EDD:   11/28/20 ---------------------------------------------------------------------- Anatomy (Fetus A)  Cranium:               Appears normal         Aortic Arch:            Appears normal  Cavum:                 Appears normal         Ductal Arch:            Appears normal  Ventricles:            Appears normal         Diaphragm:  Appears normal  Choroid Plexus:        Appears normal         Stomach:                Appears normal, left                                                                        sided  Cerebellum:            Appears normal         Abdomen:                Appears normal  Posterior Fossa:       Appears normal          Abdominal Wall:         Appears nml (cord                                                                        insert, abd wall)  Nuchal Fold:           Appears normal         Cord Vessels:           Appears normal (3                                                                        vessel cord)  Face:                  Appears normal         Kidneys:                Appear normal                         (orbits and profile)  Lips:                  Not well visualized    Bladder:                Appears normal  Thoracic:              Appears normal         Spine:                  Appears normal  Heart:                 Appears normal         Upper Extremities:      Appears normal                         (4CH, axis, and  situs)  RVOT:                  Appears normal         Lower Extremities:      Appears normal  LVOT:                  Appears normal  Other:  Fetus a female. Technically difficult due to fetal position. ---------------------------------------------------------------------- Fetal Evaluation (Fetus B)  Num Of Fetuses:         2  Fetal Heart Rate(bpm):  144  Cardiac Activity:       Observed  Fetal Lie:              Maternal left side  Presentation:           Cephalic  Placenta:               Posterior  P. Cord Insertion:      Visualized  Membrane Desc:      Dividing Membrane seen - Dichorionic.  Amniotic Fluid  AFI FV:      Within normal limits                              Largest Pocket(cm)                              6.47 ---------------------------------------------------------------------- Biometry (Fetus B)  BPD:      44.5  mm     G. Age:  19w 3d         33  %    CI:        71.34   %    70 - 86                                                          FL/HC:      19.2   %    16.8 - 19.8  HC:      167.8  mm     G. Age:  19w 3d         24  %    HC/AC:      1.13        1.09 - 1.39  AC:      148.8  mm     G. Age:  20w 1d         54  %    FL/BPD:     72.6   %  FL:       32.3   mm     G. Age:  20w 0d         50  %    FL/AC:      21.7   %    20 - 24  CER:      20.3  mm     G. Age:  19w 4d         57  %  NFT:       4.1  mm  CM:        3.4  mm  Est. FW:     327  gm    0 lb 12 oz      55  %  FW Discordancy         4  % ---------------------------------------------------------------------- Gestational Age (Fetus B)  LMP:           19w 6d        Date:  02/22/20                 EDD:   11/28/20  U/S Today:     19w 5d                                        EDD:   11/29/20  Best:          19w 6d     Det. By:  LMP  (02/22/20)          EDD:   11/28/20 ---------------------------------------------------------------------- Anatomy (Fetus B)  Cranium:               Appears normal         Aortic Arch:            Appears normal  Cavum:                 Appears normal         Ductal Arch:            Not well visualized  Ventricles:            Appears normal         Diaphragm:              Appears normal  Choroid Plexus:        Appears normal         Stomach:                Appears normal, left                                                                        sided  Cerebellum:            Appears normal         Abdomen:                Appears normal  Posterior Fossa:       Appears normal         Abdominal Wall:         Appears nml (cord                                                                        insert, abd wall)  Nuchal Fold:           Appears normal         Cord Vessels:           Appears normal (3  vessel cord)  Face:                  Appears normal         Kidneys:                Appear normal                         (orbits and profile)  Lips:                  Appears normal         Bladder:                Appears normal  Thoracic:              Appears normal         Spine:                  Appears normal  Heart:                 Appears normal; EIF    Upper Extremities:      Appears normal  RVOT:                   Appears normal         Lower Extremities:      Appears normal  LVOT:                  Not well visualized  Other:  Fetus a female. Technically difficult due to fetal position. ---------------------------------------------------------------------- Cervix Uterus Adnexa  Cervix  Length:            3.4  cm.  Normal appearance by transabdominal scan. ---------------------------------------------------------------------- Comments  This patient was seen due to a spontaneously conceived twin  pregnancy.  She denies any significant past medical history  and denies any problems in her current pregnancy.  The patient had a cell free DNA test (Materni T21) earlier in  her pregnancy which indicated a low risk for trisomy 68, 53,  and 49.  At least one female fetus is present.  A thick dividing membrane was noted separating the two  fetuses, indicating that these are dichorionic, diamniotic twins.  The fetal growth and amniotic fluid level appeared  appropriate for both twin A and twin B.  There were no obvious anomalies noted in twin A.  ventricle of the fetal heart in twin B.  The small association  between an echogenic focus and Down syndrome was  discussed. Due to the echogenic focus noted today, the  patient was offered and declined an amniocentesis today for  definitive diagnosis of fetal aneuploidy.  She reports that she  is comfortable with her negative cell free DNA test.  The views of the fetal anatomy were limited today due to the  fetal positions.  The limitations of ultrasound in the detection of all anomalies  was discussed today.  The management of dichorionic twins was discussed.  She  was advised that management of twin pregnancies will  involve frequent ultrasound exams to assess the fetal growth  and amniotic fluid level. We will continue to follow her with  serial growth ultrasounds.  Weekly fetal testing for dichorionic  twins should start at around 36 weeks.  Delivery for  uncomplicated dichorionic twins  should occur at around 38  weeks.  The increased risk of preeclampsia, gestational diabetes, and  preterm birth/labor associated with twin  pregnancies was  discussed.  She was advised that she will continue to be  followed closely to assess for these conditions. As  pregnancies with multiple gestations are at increased risk for  developing preeclampsia, she was advised to continue taking  a daily baby aspirin (81 mg per day) to decrease her risk of  developing preeclampsia.  A follow-up exam was scheduled in 4 weeks to assess the  fetal growth and to complete the views of the fetal anatomy. ----------------------------------------------------------------------                   Ma Rings, MD Electronically Signed Final Report   07/10/2020 11:16 am ----------------------------------------------------------------------  [place] MFM OB DETAIL ADDL GEST +14 WK  Result Date: 07/10/2020 ----------------------------------------------------------------------  OBSTETRICS REPORT                       (Signed Final 07/10/2020 11:16 am) ---------------------------------------------------------------------- Patient Info  ID #:       [id number]                          D.O.B.:  2001-01-07 (19 yrs)  Name:       Donia Pounds               Visit Date: 07/10/2020 08:44 am ---------------------------------------------------------------------- Performed By  Attending:        Ma Rings MD         Secondary Phy.:   [facility]  Performed By:     Emeline Darling BS,      Address:          [address]                    [address]                                                             [address]  Referred By:      Aviva Signs       [facility]  ---------------------------------------------------------------------- Orders  #  Description  Code        Ordered By  1  [place] MFM OB DETAIL +14 WK               [id number]    Wynelle Bourgeois  2  [place] MFM OB DETAIL ADDL GEST            76811.02    [facility]     +14 WK ----------------------------------------------------------------------  #  Order #                     Accession #                Episode #  1  [id number]                   [id number]                 [id number]  2  [id number]                   [id number]                 [id number] ---------------------------------------------------------------------- Indications  Twin pregnancy, di/di, second trimester        O30.042  Fetal abnormality - other known or             O35.9XX0  suspected (ICEF twin B)  [redacted] weeks gestation of pregnancy                Z3A.19  Encounter for antenatal screening for          Z36.3  malformations ---------------------------------------------------------------------- Fetal Evaluation (Fetus A)  Num Of Fetuses:         2  Fetal Heart Rate(bpm):  153  Cardiac Activity:       Observed  Fetal Lie:              Maternal right side  Presentation:           Cephalic  Placenta:               Posterior  P. Cord Insertion:      Visualized  Membrane Desc:      Dividing Membrane seen - Dichorionic.  Amniotic Fluid  AFI FV:      Within normal limits                              Largest Pocket(cm)                              4.84 ---------------------------------------------------------------------- Biometry (Fetus A)  BPD:      49.4  mm     G. Age:  21w 0d         88  %    CI:        80.67   %    70 - 86                                                          FL/HC:      18.0   %    16.8 - 19.8  HC:      173.7  mm  G. Age:  19w 6d         45  %    HC/AC:      1.12        1.09 - 1.39  AC:      154.8  mm     G. Age:  20w 5d         71  %    FL/BPD:     63.2   %  FL:       31.2  mm     G. Age:  19w 5d         36  %     FL/AC:      20.2   %    20 - 24  HUM:      32.6  mm     G. Age:  21w 0d         82  %  CER:      21.2  mm     G. Age:  20w 1d         79  %  NFT:       4.9  mm  CM:        6.3  mm  Est. FW:     339  gm    0 lb 12 oz      66  %     FW Discordancy      0 \ 4 % ---------------------------------------------------------------------- OB History  Gravidity:    1 ---------------------------------------------------------------------- Gestational Age (Fetus A)  LMP:           19w 6d        Date:  02/22/20                 EDD:   11/28/20  U/S Today:     20w 2d                                        EDD:   11/25/20  Best:          19w 6d     Det. By:  LMP  (02/22/20)          EDD:   11/28/20 ---------------------------------------------------------------------- Anatomy (Fetus A)  Cranium:               Appears normal         Aortic Arch:            Appears normal  Cavum:                 Appears normal         Ductal Arch:            Appears normal  Ventricles:            Appears normal         Diaphragm:              Appears normal  Choroid Plexus:        Appears normal         Stomach:                Appears normal, left  sided  Cerebellum:            Appears normal         Abdomen:                Appears normal  Posterior Fossa:       Appears normal         Abdominal Wall:         Appears nml (cord                                                                        insert, abd wall)  Nuchal Fold:           Appears normal         Cord Vessels:           Appears normal (3                                                                        vessel cord)  Face:                  Appears normal         Kidneys:                Appear normal                         (orbits and profile)  Lips:                  Not well visualized    Bladder:                Appears normal  Thoracic:              Appears normal         Spine:                  Appears normal   Heart:                 Appears normal         Upper Extremities:      Appears normal                         (4CH, axis, and                         situs)  RVOT:                  Appears normal         Lower Extremities:      Appears normal  LVOT:                  Appears normal  Other:  Fetus a female. Technically difficult due to fetal position. ---------------------------------------------------------------------- Fetal Evaluation (Fetus B)  Num Of Fetuses:         2  Fetal Heart Rate(bpm):  144  Cardiac Activity:       Observed  Fetal Lie:              Maternal left side  Presentation:           Cephalic  Placenta:               Posterior  P. Cord Insertion:      Visualized  Membrane Desc:      Dividing Membrane seen - Dichorionic.  Amniotic Fluid  AFI FV:      Within normal limits                              Largest Pocket(cm)                              6.47 ---------------------------------------------------------------------- Biometry (Fetus B)  BPD:      44.5  mm     G. Age:  19w 3d         33  %    CI:        71.34   %    70 - 86                                                          FL/HC:      19.2   %    16.8 - 19.8  HC:      167.8  mm     G. Age:  19w 3d         24  %    HC/AC:      1.13        1.09 - 1.39  AC:      148.8  mm     G. Age:  20w 1d         54  %    FL/BPD:     72.6   %  FL:       32.3  mm     G. Age:  20w 0d         50  %    FL/AC:      21.7   %    20 - 24  CER:      20.3  mm     G. Age:  19w 4d         57  %  NFT:       4.1  mm  CM:        3.4  mm  Est. FW:     327  gm    0 lb 12 oz      55  %     FW Discordancy         4  % ---------------------------------------------------------------------- Gestational Age (Fetus B)  LMP:           19w 6d        Date:  02/22/20                 EDD:   11/28/20  U/S Today:     19w 5d  EDD:   11/29/20  Best:          19w 6d     Det. By:  LMP  (02/22/20)          EDD:   11/28/20  ---------------------------------------------------------------------- Anatomy (Fetus B)  Cranium:               Appears normal         Aortic Arch:            Appears normal  Cavum:                 Appears normal         Ductal Arch:            Not well visualized  Ventricles:            Appears normal         Diaphragm:              Appears normal  Choroid Plexus:        Appears normal         Stomach:                Appears normal, left                                                                        sided  Cerebellum:            Appears normal         Abdomen:                Appears normal  Posterior Fossa:       Appears normal         Abdominal Wall:         Appears nml (cord                                                                        insert, abd wall)  Nuchal Fold:           Appears normal         Cord Vessels:           Appears normal (3                                                                        vessel cord)  Face:                  Appears normal         Kidneys:                Appear normal                         (  orbits and profile)  Lips:                  Appears normal         Bladder:                Appears normal  Thoracic:              Appears normal         Spine:                  Appears normal  Heart:                 Appears normal; EIF    Upper Extremities:      Appears normal  RVOT:                  Appears normal         Lower Extremities:      Appears normal  LVOT:                  Not well visualized  Other:  Fetus a female. Technically difficult due to fetal position. ---------------------------------------------------------------------- Cervix Uterus Adnexa  Cervix  Length:            3.4  cm.  Normal appearance by transabdominal scan. ---------------------------------------------------------------------- Comments  This patient was seen due to a spontaneously conceived twin  pregnancy.  She denies any significant past medical history  and denies any problems in  her current pregnancy.  The patient had a cell free DNA test (Materni T21) earlier in  her pregnancy which indicated a low risk for trisomy 8121, 2618,  and 7013.  At least one female fetus is present.  A thick dividing membrane was noted separating the two  fetuses, indicating that these are dichorionic, diamniotic twins.  The fetal growth and amniotic fluid level appeared  appropriate for both twin A and twin B.  There were no obvious anomalies noted in twin A.  ventricle of the fetal heart in twin B.  The small association  between an echogenic focus and Down syndrome was  discussed. Due to the echogenic focus noted today, the  patient was offered and declined an amniocentesis today for  definitive diagnosis of fetal aneuploidy.  She reports that she  is comfortable with her negative cell free DNA test.  The views of the fetal anatomy were limited today due to the  fetal positions.  The limitations of ultrasound in the detection of all anomalies  was discussed today.  The management of dichorionic twins was discussed.  She  was advised that management of twin pregnancies will  involve frequent ultrasound exams to assess the fetal growth  and amniotic fluid level. We will continue to follow her with  serial growth ultrasounds.  Weekly fetal testing for dichorionic  twins should start at around 36 weeks.  Delivery for  uncomplicated dichorionic twins should occur at around 38  weeks.  The increased risk of preeclampsia, gestational diabetes, and  preterm birth/labor associated with twin pregnancies was  discussed.  She was advised that she will continue to be  followed closely to assess for these conditions. As  pregnancies with multiple gestations are at increased risk for  developing preeclampsia, she was advised to continue taking  a daily baby aspirin (81 mg per day) to decrease her risk of  developing preeclampsia.  A follow-up exam was scheduled in 4 weeks to assess the  fetal growth  and to complete the views of the  fetal anatomy. ----------------------------------------------------------------------                   Ma Rings, MD Electronically Signed Final Report   07/10/2020 11:16 am ----------------------------------------------------------------------   Assessment and Plan:  Pregnancy: G1P0000 at [redacted]w[redacted]d 1. Dichorionic diamniotic twin pregnancy in second trimester 2. [redacted] weeks gestation of pregnancy 3. Pregnancy, supervision of, high-risk, second trimester Reassured about symptoms, especially with her enlarging uterus.  She is taking Pantoprazole for heartburn.  Preterm labor symptoms and general obstetric precautions including but not limited to vaginal bleeding, contractions, leaking of fluid and fetal movement were reviewed in detail with the patient. Please refer to After Visit Summary for other counseling recommendations.   Return for OB visits as scheduled.  Future Appointments  Date Time Provider Department Center  08/08/2020 10:00 AM [facility] NURSE [facility]  08/08/2020 10:15 AM [facility]  08/09/2020  8:45 AM [name], DO [facility] None  09/06/2020  8:15 AM Adrian Blackwater, Rhona Raider, DO [facility] None    Jaynie Collins, MD

## 2020-07-20 NOTE — Progress Notes (Signed)
Patient called complaining of stomach pains- worried its braxton Hicks contractions. Patient is complaining of pain in upper middle abdomen. Patient states this pain is worse when she is laying down. Armandina Stammer RN

## 2020-07-20 NOTE — Patient Instructions (Signed)
Return to office for any scheduled appointments. Call the office or go to the MAU at [facility] if:  You begin to have strong, frequent contractions  Your water breaks.  Sometimes it is a big gush of fluid, sometimes it is just a trickle that keeps getting your panties wet or running down your legs  You have vaginal bleeding.  It is normal to have a small amount of spotting if your cervix was checked.   You do not feel your baby moving like normal.  If you do not, get something to eat and drink and lay down and focus on feeling your baby move.   If your baby is still not moving like normal, you should call the office or go to MAU.  Any other obstetric concerns.   

## 2020-07-30 ENCOUNTER — Ambulatory Visit (INDEPENDENT_AMBULATORY_CARE_PROVIDER_SITE_OTHER): Payer: Medicaid Other

## 2020-07-30 ENCOUNTER — Other Ambulatory Visit: Payer: Self-pay

## 2020-07-30 VITALS — BP 126/80 | HR 101 | Wt 155.0 lb

## 2020-07-30 DIAGNOSIS — R399 Unspecified symptoms and signs involving the genitourinary system: Secondary | ICD-10-CM | POA: Diagnosis not present

## 2020-07-30 LAB — POCT URINALYSIS DIPSTICK OB
Bilirubin, UA: NEGATIVE
Glucose, UA: NEGATIVE
Ketones, UA: NEGATIVE
Leukocytes, UA: NEGATIVE
Nitrite, UA: NEGATIVE
Spec Grav, UA: 1.015 (ref 1.010–1.025)
Urobilinogen, UA: 0.2 E.U./dL
pH, UA: 5.5 (ref 5.0–8.0)

## 2020-07-30 NOTE — Progress Notes (Addendum)
SUBJECTIVE: Alexis [name] is a 20 y.o. female who complains of urinary frequency, urgency and dysuria x 3 days, without flank pain, fever, chills, or abnormal vaginal discharge or bleeding.   OBJECTIVE: Appears well, in no apparent distress.  Vital signs are normal. Urine dipstick shows negative for all components.    ASSESSMENT: Dysuria   PLAN: Urine culture was sent to the lab.   Attestation of Attending Supervision of CMA/RN: Evaluation and management procedures were performed by the nurse under my supervision and collaboration.  I have reviewed the nursing note and chart, and I agree with the management and plan.  Carolyn L. Harraway-Smith, M.D., Evern Core

## 2020-08-02 LAB — CULTURE, OB URINE

## 2020-08-02 LAB — URINE CULTURE, OB REFLEX

## 2020-08-03 ENCOUNTER — Encounter (HOSPITAL_COMMUNITY): Payer: Self-pay | Admitting: Obstetrics and Gynecology

## 2020-08-03 ENCOUNTER — Inpatient Hospital Stay (HOSPITAL_COMMUNITY)
Admission: AD | Admit: 2020-08-03 | Discharge: 2020-08-03 | Disposition: A | Discharge status: Home / Self Care | Payer: Medicaid Other | Attending: Obstetrics and Gynecology | Admitting: Obstetrics and Gynecology

## 2020-08-03 ENCOUNTER — Inpatient Hospital Stay (HOSPITAL_BASED_OUTPATIENT_CLINIC_OR_DEPARTMENT_OTHER): Payer: Medicaid Other

## 2020-08-03 ENCOUNTER — Other Ambulatory Visit: Payer: Self-pay

## 2020-08-03 DIAGNOSIS — R109 Unspecified abdominal pain: Secondary | ICD-10-CM | POA: Diagnosis not present

## 2020-08-03 DIAGNOSIS — Z8616 Personal history of COVID-19: Secondary | ICD-10-CM | POA: Insufficient documentation

## 2020-08-03 DIAGNOSIS — O26892 Other specified pregnancy related conditions, second trimester: Secondary | ICD-10-CM

## 2020-08-03 DIAGNOSIS — O26899 Other specified pregnancy related conditions, unspecified trimester: Secondary | ICD-10-CM

## 2020-08-03 DIAGNOSIS — O359XX2 Maternal care for (suspected) fetal abnormality and damage, unspecified, fetus 2: Secondary | ICD-10-CM

## 2020-08-03 DIAGNOSIS — Z3A23 23 weeks gestation of pregnancy: Secondary | ICD-10-CM | POA: Diagnosis not present

## 2020-08-03 DIAGNOSIS — O30042 Twin pregnancy, dichorionic/diamniotic, second trimester: Secondary | ICD-10-CM | POA: Diagnosis not present

## 2020-08-03 LAB — URINALYSIS, ROUTINE W REFLEX MICROSCOPIC
Bilirubin Urine: NEGATIVE
Glucose, UA: 50 mg/dL — AB
Hgb urine dipstick: NEGATIVE
Ketones, ur: NEGATIVE mg/dL
Leukocytes,Ua: NEGATIVE
Nitrite: NEGATIVE
Protein, ur: NEGATIVE mg/dL
Specific Gravity, Urine: 1.023 (ref 1.005–1.030)
pH: 6 (ref 5.0–8.0)

## 2020-08-03 NOTE — Discharge Instructions (Signed)
Preterm Labor The normal length of a pregnancy is 39-41 weeks. Preterm labor is when labor starts before 37 completed weeks of pregnancy. Babies who are born prematurely and survive may not be fully developed and may be at an increased risk for long-term problems such as cerebral palsy, developmental delays, and vision and hearing problems. Babies who are born too early may have problems soon after birth. Problems may include regulating blood sugar, body temperature, heart rate, and breathing rate. These babies often have trouble with feeding. The risk of having problems is highest for babies who are born before 34 weeks of pregnancy. What are the causes? The exact cause of this condition is not known. What increases the risk? You are more likely to have preterm labor if you have certain risk factors that relate to your medical history, problems with present and past pregnancies, and lifestyle factors. Medical history  You have abnormalities of the uterus, including a short cervix.  You have STIs (sexually transmitted infections), or other infections of the urinary tract and the vagina.  You have chronic illnesses, such as blood clotting problems, diabetes, or high blood pressure.  You are overweight or underweight. Present and past pregnancies  You have had preterm labor before.  You are pregnant with twins or other multiples.  You have been diagnosed with a condition in which the placenta covers your cervix (placenta previa).  You waited less than 6 months between giving birth and becoming pregnant again.  Your unborn baby has some abnormalities.  You have vaginal bleeding during pregnancy.  You became pregnant through in vitro fertilization (IVF). Lifestyle and environmental factors  You use tobacco products.  You drink alcohol.  You use street drugs.  You have stress and no social support.  You experience domestic violence.  You are exposed to certain chemicals or  environmental pollutants. Other factors  You are younger than age 17 or older than age 35. What are the signs or symptoms? Symptoms of this condition include:  Cramps similar to those that can happen during a menstrual period. The cramps may happen with diarrhea.  Pain in the abdomen or lower back.  Regular contractions that may feel like tightening of the abdomen.  A feeling of increased pressure in the pelvis.  Increased watery or bloody mucus discharge from the vagina.  Water breaking (ruptured amniotic sac). How is this diagnosed? This condition is diagnosed based on:  Your medical history and a physical exam.  A pelvic exam.  An ultrasound.  Monitoring your uterus for contractions.  Other tests, including: ? A swab of the cervix to check for a chemical called fetal fibronectin. ? Urine tests. How is this treated? Treatment for this condition depends on the length of your pregnancy, your condition, and the health of your baby. Treatment may include:  Taking medicines, such as: ? Hormone medicines. These may be given early in pregnancy to help support the pregnancy. ? Medicines to stop contractions. ? Medicines to help mature the baby's lungs. These may be prescribed if the risk of delivery is high. ? Medicines to prevent your baby from developing cerebral palsy.  Bed rest. If the labor happens before 34 weeks of pregnancy, you may need to stay in the hospital.  Delivery of the baby. Follow these instructions at home:  Do not use any products that contain nicotine or tobacco, such as cigarettes, e-cigarettes, and chewing tobacco. If you need help quitting, ask your health care provider.  Do not drink alcohol.    Take over-the-counter and prescription medicines only as told by your health care provider.  Rest as told by your health care provider.  Return to your normal activities as told by your health care provider. Ask your health care provider what activities  are safe for you.  Keep all follow-up visits as told by your health care provider. This is important.   How is this prevented? To increase your chance of having a full-term pregnancy:  Do not use street drugs or medicines that have not been prescribed to you during your pregnancy.  Talk with your health care provider before taking any herbal supplements, even if you have been taking them regularly.  Make sure you gain a healthy amount of weight during your pregnancy.  Watch for infection. If you think that you might have an infection, get it checked right away. Symptoms of infection may include: ? Fever. ? Abnormal vaginal discharge or discharge that smells bad. ? Pain or burning with urination. ? Needing to urinate urgently. ? Frequently urinating or passing small amounts of urine frequently. ? Blood in your urine. ? Urine that smells bad or unusual.  Tell your health care provider if you have had preterm labor before. Contact a health care provider if:  You think you are going into preterm labor.  You have signs or symptoms of preterm labor.  You have symptoms of infection. Get help right away if:  You are having regular, painful contractions every 5 minutes or less.  Your water breaks. Summary  Preterm labor is labor that starts before you reach 37 weeks of pregnancy.  Delivering your baby early increases your baby's risk of developing lifelong problems.  The exact cause of preterm labor is unknown. However, having an abnormal uterus, an STI (sexually transmitted infection), or vaginal bleeding during pregnancy increases your risk for preterm labor.  Keep all follow-up visits as told by your health care provider. This is important.  Contact a health care provider if you have signs or symptoms of preterm labor. This information is not intended to replace advice given to you by your health care provider. Make sure you discuss any questions you have with your health care  provider. Document Revised: 07/05/2019 Document Reviewed: 07/05/2019 Elsevier Patient Education  2021 Elsevier Inc.  

## 2020-08-03 NOTE — MAU Note (Addendum)
.   Alexis [name] is a 20 y.o. at [redacted]w[redacted]d here in MAU reporting: abdominal pain that started x3 days ago. She states its tightening at the top of her uterus and sometimes feels like a burning sensation. DIDI twins. No VB or LOF. Endorses good fetal movement for both A and B. Last intercourse was last week.   Pain score: 6 Vitals:   08/03/20 1236 08/03/20 1242  BP: 135/82 125/69  Pulse: (!) 125 (!) 115  Resp: 15   SpO2: 97%      FHT: A- 146    B-155 Lab orders placed from triage: UA

## 2020-08-03 NOTE — MAU Provider Note (Signed)
History     CSN: [id number]  Arrival date and time: 08/03/20 1216   Event Date/Time   First Provider Initiated Contact with Patient 08/03/20 1309      Chief Complaint  Patient presents with  . Abdominal Pain   HPI Alexis [name] is a 20 y.o. G1P0000 at [redacted]w[redacted]d with di/di twins who presents with abdominal pain. Symptoms started several days ago and occurs 10-20 times per day. Reports epigastric tightening that radiates down just below her umbilicus. Describes as more tightening feeling than pain & currently rates pain 0/10. Hasn't treated symptoms. Nothing makes better or worse. Denies n/v/d, dysuria, vaginal bleeding, LOF, or recent intercourse. Reports good fetal movement x 2.   OB History    Gravida  1   Para  0   Term  0   Preterm  0   AB  0   Living  0     SAB  0   IAB  0   Ectopic  0   Multiple  0   Live Births  0           Past Medical History:  Diagnosis Date  . Anxiety   . COVID-19   . Depression   . GERD (gastroesophageal reflux disease)   . Tachycardia    no medications-intermittent    Past Surgical History:  Procedure Laterality Date  . NO PAST SURGERIES      Family History  Problem Relation Age of Onset  . Diabetes Mellitus II Mother   . Hypertension Mother   . Hypertension Father   . Stomach cancer Maternal Aunt   . Stomach cancer Paternal Grandmother     Social History   Tobacco Use  . Smoking status: Never Smoker  . Smokeless tobacco: Never Used  Vaping Use  . Vaping Use: Never used  Substance Use Topics  . Alcohol use: Not Currently  . Drug use: Not Currently    Allergies:  Allergies  Allergen Reactions  . Dust Mite Extract Hives  . Mixed Ragweed Hives    No medications prior to admission.    Review of Systems  Constitutional: Negative.   Gastrointestinal: Positive for abdominal pain. Negative for constipation, diarrhea, nausea and vomiting.  Genitourinary: Negative.    Physical Exam   Blood pressure  139/69, pulse 97, temperature 99 F (37.2 C), temperature source Oral, resp. rate 15, last menstrual period 02/22/2020, SpO2 97 %.  Physical Exam Vitals and nursing note reviewed. Exam conducted with a chaperone present.  Constitutional:      General: She is not in acute distress.    Appearance: She is well-developed and normal weight.  HENT:     Head: Normocephalic and atraumatic.  Pulmonary:     Effort: Pulmonary effort is normal. No respiratory distress.  Abdominal:     Tenderness: There is no abdominal tenderness.  Genitourinary:    Comments: Dilation: Closed  Neurological:     Mental Status: She is alert.    Fetal Tracing: Baby A Baseline: 145 Variability: moderate Accelerations: 10x10 Decelerations: spontaneous decel down to 120s  Baby B Baseline: 150 Variability: moderate Accelerations: none  Decelerations: none  Toco: no contractions    MAU Course  Procedures Results for orders placed or performed during the hospital encounter of 08/03/20 (from the past 24 hour(s))  Urinalysis, Routine w reflex microscopic Urine, Clean Catch     Status: Abnormal   Collection Time: 08/03/20 12:42 PM  Result Value Ref Range   Color, Urine YELLOW YELLOW  APPearance HAZY (A) CLEAR   Specific Gravity, Urine 1.023 1.005 - 1.030   pH 6.0 5.0 - 8.0   Glucose, UA 50 (A) NEGATIVE mg/dL   Hgb urine dipstick NEGATIVE NEGATIVE   Bilirubin Urine NEGATIVE NEGATIVE   Ketones, ur NEGATIVE NEGATIVE mg/dL   Protein, ur NEGATIVE NEGATIVE mg/dL   Nitrite NEGATIVE NEGATIVE   Leukocytes,Ua NEGATIVE NEGATIVE    MDM No contractions palpated or on TOCO.  Fetal tracing appropriate for gestational age x 2  Cervix closed Cervical length 4 cm per ultrasound  Assessment and Plan   1. Abdominal cramping affecting pregnancy   2. Dichorionic diamniotic twin pregnancy in second trimester   3. [redacted] weeks gestation of pregnancy    -reviewed PTL precautions & reasons to return to MAU  Judeth Horn 08/03/2020, 2:48 PM

## 2020-08-08 ENCOUNTER — Encounter: Payer: Self-pay | Admitting: *Deleted

## 2020-08-08 ENCOUNTER — Other Ambulatory Visit: Payer: Self-pay

## 2020-08-08 ENCOUNTER — Ambulatory Visit: Payer: Medicaid Other | Attending: Obstetrics

## 2020-08-08 ENCOUNTER — Other Ambulatory Visit: Payer: Self-pay | Admitting: *Deleted

## 2020-08-08 ENCOUNTER — Ambulatory Visit: Payer: Medicaid Other | Admitting: *Deleted

## 2020-08-08 DIAGNOSIS — O30042 Twin pregnancy, dichorionic/diamniotic, second trimester: Secondary | ICD-10-CM | POA: Diagnosis not present

## 2020-08-08 DIAGNOSIS — Z362 Encounter for other antenatal screening follow-up: Secondary | ICD-10-CM | POA: Diagnosis not present

## 2020-08-08 DIAGNOSIS — O30041 Twin pregnancy, dichorionic/diamniotic, first trimester: Secondary | ICD-10-CM

## 2020-08-08 DIAGNOSIS — O0991 Supervision of high risk pregnancy, unspecified, first trimester: Secondary | ICD-10-CM

## 2020-08-08 DIAGNOSIS — O359XX0 Maternal care for (suspected) fetal abnormality and damage, unspecified, not applicable or unspecified: Secondary | ICD-10-CM

## 2020-08-08 DIAGNOSIS — Z3A24 24 weeks gestation of pregnancy: Secondary | ICD-10-CM

## 2020-08-08 DIAGNOSIS — O30049 Twin pregnancy, dichorionic/diamniotic, unspecified trimester: Secondary | ICD-10-CM

## 2020-08-09 ENCOUNTER — Ambulatory Visit (INDEPENDENT_AMBULATORY_CARE_PROVIDER_SITE_OTHER): Payer: Medicaid Other | Admitting: Family Medicine

## 2020-08-09 VITALS — BP 132/75 | HR 103 | Wt 153.0 lb

## 2020-08-09 DIAGNOSIS — Z3A24 24 weeks gestation of pregnancy: Secondary | ICD-10-CM

## 2020-08-09 DIAGNOSIS — O30041 Twin pregnancy, dichorionic/diamniotic, first trimester: Secondary | ICD-10-CM

## 2020-08-09 DIAGNOSIS — O0991 Supervision of high risk pregnancy, unspecified, first trimester: Secondary | ICD-10-CM

## 2020-08-09 MED ORDER — CEFADROXIL 500 MG PO CAPS: 500.0000 mg | ORAL_CAPSULE | Freq: Two times a day (BID) | ORAL | 0 refills | Status: DC

## 2020-08-09 NOTE — Progress Notes (Signed)
   PRENATAL VISIT NOTE  Subjective:  Alexis [name] is a 20 y.o. G1P0000 at [redacted]w[redacted]d being seen today for ongoing prenatal care.  She is currently monitored for the following issues for this high-risk pregnancy and has GERD (gastroesophageal reflux disease); Dichorionic diamniotic twin pregnancy in first trimester; and High-risk pregnancy, first trimester on their problem list.  Patient reports occasional contractions.  Contractions: Not present. Vag. Bleeding: None.  Movement: Present. Denies leaking of fluid.   The following portions of the patient's history were reviewed and updated as appropriate: allergies, current medications, past family history, past medical history, past social history, past surgical history and problem list.   Objective:   Vitals:   08/09/20 0830  BP: 132/75  Pulse: (!) 103  Weight: 153 lb (69.4 kg)    Fetal Status: Fetal Heart Rate (bpm): 145/152 Fundal Height: 28 cm Movement: Present     General:  Alert, oriented and cooperative. Patient is in no acute distress.  Skin: Skin is warm and dry. No rash noted.   Cardiovascular: Normal heart rate noted  Respiratory: Normal respiratory effort, no problems with respiration noted  Abdomen: Soft, gravid, appropriate for gestational age.  Pain/Pressure: Absent     Pelvic: Cervical exam deferred        Extremities: Normal range of motion.  Edema: None  Mental Status: Normal mood and affect. Normal behavior. Normal judgment and thought content.   Assessment and Plan:  Pregnancy: G1P0000 at [redacted]w[redacted]d 1. [redacted] weeks gestation of pregnancy  2. High-risk pregnancy, first trimester FHT and FH normal  3. Dichorionic diamniotic twin pregnancy in first trimester 7% discordance. EFW yesterday: 32%/53% LVEF in twin B.  Preterm labor symptoms and general obstetric precautions including but not limited to vaginal bleeding, contractions, leaking of fluid and fetal movement were reviewed in detail with the patient. Please refer  to After Visit Summary for other counseling recommendations.   No follow-ups on file.  Future Appointments  Date Time Provider Department Center  09/05/2020  8:15 AM [facility] NURSE [facility]  09/05/2020  8:30 AM [facility]  09/06/2020  8:15 AM [name], DO [facility] None    [name], DO

## 2020-08-13 MED ORDER — DOXYLAMINE-PYRIDOXINE 10-10 MG PO TBEC: 2.0000 | DELAYED_RELEASE_TABLET | Freq: Every day | ORAL | 5 refills | Status: DC

## 2020-08-13 MED ORDER — SERTRALINE HCL 50 MG PO TABS: 50.0000 mg | ORAL_TABLET | Freq: Every day | ORAL | 3 refills | Status: DC

## 2020-08-13 NOTE — Addendum Note (Signed)
Addended by: [name] on: 08/13/2020 11:07 AM   Modules accepted: Orders

## 2020-08-14 ENCOUNTER — Other Ambulatory Visit: Payer: Self-pay

## 2020-08-14 ENCOUNTER — Encounter (HOSPITAL_COMMUNITY): Payer: Self-pay | Admitting: Family Medicine

## 2020-08-14 ENCOUNTER — Inpatient Hospital Stay (HOSPITAL_COMMUNITY)
Admission: AD | Admit: 2020-08-14 | Discharge: 2020-08-14 | Disposition: A | Discharge status: Home / Self Care | Payer: Medicaid Other | Attending: Family Medicine | Admitting: Family Medicine

## 2020-08-14 DIAGNOSIS — R519 Headache, unspecified: Secondary | ICD-10-CM

## 2020-08-14 DIAGNOSIS — O162 Unspecified maternal hypertension, second trimester: Secondary | ICD-10-CM | POA: Diagnosis not present

## 2020-08-14 DIAGNOSIS — R110 Nausea: Secondary | ICD-10-CM | POA: Diagnosis not present

## 2020-08-14 DIAGNOSIS — H538 Other visual disturbances: Secondary | ICD-10-CM | POA: Diagnosis not present

## 2020-08-14 DIAGNOSIS — O26892 Other specified pregnancy related conditions, second trimester: Secondary | ICD-10-CM | POA: Diagnosis not present

## 2020-08-14 DIAGNOSIS — Z3A24 24 weeks gestation of pregnancy: Secondary | ICD-10-CM | POA: Diagnosis not present

## 2020-08-14 DIAGNOSIS — R030 Elevated blood-pressure reading, without diagnosis of hypertension: Secondary | ICD-10-CM | POA: Insufficient documentation

## 2020-08-14 DIAGNOSIS — Z8249 Family history of ischemic heart disease and other diseases of the circulatory system: Secondary | ICD-10-CM | POA: Diagnosis not present

## 2020-08-14 DIAGNOSIS — G44209 Tension-type headache, unspecified, not intractable: Secondary | ICD-10-CM | POA: Diagnosis not present

## 2020-08-14 DIAGNOSIS — O30042 Twin pregnancy, dichorionic/diamniotic, second trimester: Secondary | ICD-10-CM

## 2020-08-14 DIAGNOSIS — Z3689 Encounter for other specified antenatal screening: Secondary | ICD-10-CM

## 2020-08-14 LAB — CBC WITH DIFFERENTIAL/PLATELET
Abs Immature Granulocytes: 0.03 10*3/uL (ref 0.00–0.07)
Basophils Absolute: 0 10*3/uL (ref 0.0–0.1)
Basophils Relative: 0 %
Eosinophils Absolute: 0.1 10*3/uL (ref 0.0–0.5)
Eosinophils Relative: 1 %
HCT: 26.7 % — ABNORMAL LOW (ref 36.0–46.0)
Hemoglobin: 8.4 g/dL — ABNORMAL LOW (ref 12.0–15.0)
Immature Granulocytes: 0 %
Lymphocytes Relative: 18 %
Lymphs Abs: 1.2 10*3/uL (ref 0.7–4.0)
MCH: 30 pg (ref 26.0–34.0)
MCHC: 31.5 g/dL (ref 30.0–36.0)
MCV: 95.4 fL (ref 80.0–100.0)
Monocytes Absolute: 0.8 10*3/uL (ref 0.1–1.0)
Monocytes Relative: 11 %
Neutro Abs: 5 10*3/uL (ref 1.7–7.7)
Neutrophils Relative %: 70 %
Platelets: 265 10*3/uL (ref 150–400)
RBC: 2.8 MIL/uL — ABNORMAL LOW (ref 3.87–5.11)
RDW: 13.3 % (ref 11.5–15.5)
WBC: 7.1 10*3/uL (ref 4.0–10.5)
nRBC: 0 % (ref 0.0–0.2)

## 2020-08-14 LAB — URINALYSIS, ROUTINE W REFLEX MICROSCOPIC
Bilirubin Urine: NEGATIVE
Glucose, UA: NEGATIVE mg/dL
Hgb urine dipstick: NEGATIVE
Ketones, ur: NEGATIVE mg/dL
Leukocytes,Ua: NEGATIVE
Nitrite: NEGATIVE
Protein, ur: NEGATIVE mg/dL
Specific Gravity, Urine: 1.004 — ABNORMAL LOW (ref 1.005–1.030)
pH: 7 (ref 5.0–8.0)

## 2020-08-14 LAB — PROTEIN / CREATININE RATIO, URINE
Creatinine, Urine: 24.07 mg/dL
Total Protein, Urine: <6 mg/dL

## 2020-08-14 LAB — COMPREHENSIVE METABOLIC PANEL
ALT: 14 U/L (ref 0–44)
AST: 18 U/L (ref 15–41)
Albumin: 2.9 g/dL — ABNORMAL LOW (ref 3.5–5.0)
Alkaline Phosphatase: 60 U/L (ref 38–126)
Anion gap: 7 (ref 5–15)
BUN: <5 mg/dL — ABNORMAL LOW (ref 6–20)
CO2: 21 mmol/L — ABNORMAL LOW (ref 22–32)
Calcium: 8.3 mg/dL — ABNORMAL LOW (ref 8.9–10.3)
Chloride: 106 mmol/L (ref 98–111)
Creatinine, Ser: 0.57 mg/dL (ref 0.44–1.00)
GFR, Estimated: >60 mL/min (ref >60)
Glucose, Bld: 78 mg/dL (ref 70–99)
Potassium: 3.8 mmol/L (ref 3.5–5.1)
Sodium: 134 mmol/L — ABNORMAL LOW (ref 135–145)
Total Bilirubin: 0.5 mg/dL (ref 0.3–1.2)
Total Protein: 6.1 g/dL — ABNORMAL LOW (ref 6.5–8.1)

## 2020-08-14 MED ORDER — ACETAMINOPHEN 500 MG PO TABS
1000.0000 mg | ORAL_TABLET | Freq: Once | ORAL | Status: AC
  Administered 2020-08-14: 1000 mg via ORAL
  Filled 2020-08-14: qty 2

## 2020-08-14 MED ORDER — ONDANSETRON 4 MG PO TBDP
8.0000 mg | ORAL_TABLET | Freq: Once | ORAL | Status: AC
  Administered 2020-08-14: 8 mg via ORAL
  Filled 2020-08-14: qty 2

## 2020-08-14 NOTE — Discharge Instructions (Signed)
How to Take Your Blood Pressure Blood pressure is a measurement of how strongly your blood is pressing against the walls of your arteries. Arteries are blood vessels that carry blood from your heart throughout your body. Your health care provider takes your blood pressure at each office visit. You can also take your own blood pressure at home with a blood pressure monitor. You may need to take your own blood pressure to:  Confirm a diagnosis of high blood pressure (hypertension).  Monitor your blood pressure over time.  Make sure your blood pressure medicine is working. Supplies needed:  Blood pressure monitor.  Dining room chair to sit in.  Table or desk.  Small notebook and pencil or pen. How to prepare To get the most accurate reading, avoid the following for 30 minutes before you check your blood pressure:  Drinking caffeine.  Drinking alcohol.  Eating.  Smoking.  Exercising. Five minutes before you check your blood pressure:  Use the bathroom and urinate so that you have an empty bladder.  Sit quietly in a dining room chair. Do not sit in a soft couch or an armchair. Do not talk. How to take your blood pressure To check your blood pressure, follow the instructions in the manual that came with your blood pressure monitor. If you have a digital blood pressure monitor, the instructions may be as follows: 1. Sit up straight in a chair. 2. Place your feet on the floor. Do not cross your ankles or legs. 3. Rest your left arm at the level of your heart on a table or desk or on the arm of a chair. 4. Pull up your shirt sleeve. 5. Wrap the blood pressure cuff around the upper part of your left arm, 1 inch (2.5 cm) above your elbow. It is best to wrap the cuff around bare skin. 6. Fit the cuff snugly around your arm. You should be able to place only one finger between the cuff and your arm. 7. Position the cord so that it rests in the bend of your elbow. 8. Press the power  button. 9. Sit quietly while the cuff inflates and deflates. 10. Read the digital reading on the monitor screen and write the numbers down (record them) in a notebook. 11. Wait 2-3 minutes, then repeat the steps, starting at step 1.   What does my blood pressure reading mean? A blood pressure reading consists of a higher number over a lower number. Ideally, your blood pressure should be below 120/80. The first ("top") number is called the systolic pressure. It is a measure of the pressure in your arteries as your heart beats. The second ("bottom") number is called the diastolic pressure. It is a measure of the pressure in your arteries as the heart relaxes. Blood pressure is classified into five stages. The following are the stages for adults who do not have a short-term serious illness or a chronic condition. Systolic pressure and diastolic pressure are measured in a unit called mm Hg (millimeters of mercury).  Normal  Systolic pressure: below 120.  Diastolic pressure: below 80. Elevated  Systolic pressure: 120-129.  Diastolic pressure: below 80. Hypertension stage 1  Systolic pressure: 130-139.  Diastolic pressure: 80-89. Hypertension stage 2  Systolic pressure: 140 or above.  Diastolic pressure: 90 or above. You can have elevated blood pressure or hypertension even if only the systolic or only the diastolic number in your reading is higher than normal. Follow these instructions at home:  Check your blood   pressure as often as recommended by your health care provider.  Check your blood pressure at the same time every day.  Take your monitor to the next appointment with your health care provider to make sure that: ? You are using it correctly. ? It provides accurate readings.  Be sure you understand what your goal blood pressure numbers are.  Tell your health care provider if you are having any side effects from blood pressure medicine.  Keep all follow-up visits as told by  your health care provider. This is important. General tips  Your health care provider can suggest a reliable monitor that will meet your needs. There are several types of home blood pressure monitors.  Choose a monitor that has an arm cuff. Do not choose a monitor that measures your blood pressure from your wrist or finger.  Choose a cuff that wraps snugly around your upper arm. You should be able to fit only one finger between your arm and the cuff.  You can buy a blood pressure monitor at most drugstores or online. Where to find more information American Heart Association: www.heart.org Contact a health care provider if:  Your blood pressure is consistently high. Get help right away if:  Your systolic blood pressure is higher than 180.  Your diastolic blood pressure is higher than 120. Summary  Blood pressure is a measurement of how strongly your blood is pressing against the walls of your arteries.  A blood pressure reading consists of a higher number over a lower number. Ideally, your blood pressure should be below 120/80.  Check your blood pressure at the same time every day.  Avoid caffeine, alcohol, smoking, and exercise for 30 minutes prior to checking your blood pressure. These agents can affect the accuracy of the blood pressure reading. This information is not intended to replace advice given to you by your health care provider. Make sure you discuss any questions you have with your health care provider. Document Revised: 05/27/2019 Document Reviewed: 05/27/2019 Elsevier Patient Education  2021 Elsevier Inc.  

## 2020-08-14 NOTE — MAU Provider Note (Signed)
History     CSN: [id number]  Arrival date and time: 08/14/20 1736   Event Date/Time   First Provider Initiated Contact with Patient 08/14/20 1832      Chief Complaint  Patient presents with  . Hypertension   Alexis [name] is a 20 y.o. G1P0 at [redacted]w[redacted]d who receives care at [facility].  She presents today for Hypertension.  She states her blood pressure has been elevated over the weekend. She also reports onset of blurry vision and HA.  She states she took tylenol yesterday, but did not take anything today.  She endorses adequate water and nutritional intake.  Patient endorses fetal movement x 2 and denies vaginal concerns including bleeding, discharge, and leaking.    OB History    Gravida  1   Para  0   Term  0   Preterm  0   AB  0   Living  0     SAB  0   IAB  0   Ectopic  0   Multiple  0   Live Births  0           Past Medical History:  Diagnosis Date  . Anxiety   . COVID-19   . Depression   . GERD (gastroesophageal reflux disease)   . Tachycardia    no medications-intermittent    Past Surgical History:  Procedure Laterality Date  . NO PAST SURGERIES      Family History  Problem Relation Age of Onset  . Diabetes Mellitus II Mother   . Hypertension Mother   . Hypertension Father   . Stomach cancer Maternal Aunt   . Stomach cancer Paternal Grandmother     Social History   Tobacco Use  . Smoking status: Never Smoker  . Smokeless tobacco: Never Used  Vaping Use  . Vaping Use: Never used  Substance Use Topics  . Alcohol use: Not Currently  . Drug use: Not Currently    Allergies:  Allergies  Allergen Reactions  . Dust Mite Extract Hives  . Mixed Ragweed Hives    Medications Prior to Admission  Medication Sig Dispense Refill Last Dose  . aspirin EC 81 MG tablet Take 81 mg by mouth daily. Swallow whole.   08/14/2020 at Unknown time  . cetirizine (ZYRTEC) 10 MG tablet Take 10 mg by mouth daily.   08/14/2020 at Unknown time  .  Doxylamine-Pyridoxine (DICLEGIS) 10-10 MG TBEC Take 2 tablets by mouth at bedtime. If symptoms persist, add one tablet in the morning and one in the afternoon 100 tablet 5 08/13/2020 at Unknown time  . fluticasone (FLONASE) 50 MCG/ACT nasal spray Place 1 spray into both nostrils in the morning and at bedtime. 9.9 mL 2 Past Week at Unknown time  . pantoprazole (PROTONIX) 40 MG tablet Take 1 tablet (40 mg total) by mouth daily. 30 tablet 1 08/14/2020 at Unknown time  . Prenatal Vit-Fe Fumarate-FA (PRENATAL VITAMINS PO) Take 1 tablet by mouth daily.   08/14/2020 at Unknown time  . sertraline (ZOLOFT) 50 MG tablet Take 1 tablet (50 mg total) by mouth daily. 30 tablet 3 Past Week at Unknown time  . cefadroxil (DURICEF) 500 MG capsule Take 1 capsule (500 mg total) by mouth 2 (two) times daily. 14 capsule 0   . polyethylene glycol (MIRALAX) 17 g packet Take 17 g by mouth daily. 30 each 11 More than a month at Unknown time    Review of Systems  Constitutional: Negative for chills and fever.  Eyes: Positive for visual disturbance.  Respiratory: Negative for cough and shortness of breath.   Genitourinary: Negative for difficulty urinating, dysuria, vaginal bleeding and vaginal discharge.  Neurological: Negative for dizziness, light-headedness and headaches.   Physical Exam   Blood pressure 123/76, pulse 96, temperature 99.1 F (37.3 C), temperature source Oral, resp. rate 12, last menstrual period 02/22/2020, SpO2 98 %.  Vitals:   08/14/20 1916 08/14/20 1931 08/14/20 1946 08/14/20 2001  BP: 136/77 (!) 141/77 133/76 131/71  Pulse: 92 87 79 85  Resp:      Temp:      TempSrc:      SpO2:        Physical Exam Vitals reviewed.  Constitutional:      Appearance: Normal appearance.  HENT:     Head: Normocephalic and atraumatic.  Eyes:     Conjunctiva/sclera: Conjunctivae normal.  Cardiovascular:     Rate and Rhythm: Normal rate and regular rhythm.     Heart sounds: Normal heart sounds.  Pulmonary:      Effort: Pulmonary effort is normal.     Breath sounds: Normal breath sounds.  Abdominal:     General: Bowel sounds are normal.     Palpations: Abdomen is soft.     Tenderness: There is no abdominal tenderness.     Comments: Gravid  Musculoskeletal:        General: Normal range of motion.     Cervical back: Normal range of motion.  Skin:    General: Skin is warm and dry.  Neurological:     Mental Status: She is alert and oriented to person, place, and time.  Psychiatric:        Mood and Affect: Mood normal.        Behavior: Behavior normal.        Thought Content: Thought content normal.     Fetal Assessment TA: 145 bpm, Mod Var, -Decels, +Accels TB: 140 bpm, Mod Var, -Decels, +Accels Toco: No ctx graphed or palpated  MAU Course   Results for orders placed or performed during the hospital encounter of 08/14/20 (from the past 24 hour(s))  Urinalysis, Routine w reflex microscopic Urine, Clean Catch     Status: Abnormal   Collection Time: 08/14/20  5:56 PM  Result Value Ref Range   Color, Urine STRAW (A) YELLOW   APPearance CLEAR CLEAR   Specific Gravity, Urine 1.004 (L) 1.005 - 1.030   pH 7.0 5.0 - 8.0   Glucose, UA NEGATIVE NEGATIVE mg/dL   Hgb urine dipstick NEGATIVE NEGATIVE   Bilirubin Urine NEGATIVE NEGATIVE   Ketones, ur NEGATIVE NEGATIVE mg/dL   Protein, ur NEGATIVE NEGATIVE mg/dL   Nitrite NEGATIVE NEGATIVE   Leukocytes,Ua NEGATIVE NEGATIVE  Protein / creatinine ratio, urine     Status: None   Collection Time: 08/14/20  6:22 PM  Result Value Ref Range   Creatinine, Urine 24.07 mg/dL   Total Protein, Urine <6 mg/dL   Protein Creatinine Ratio        0.00 - 0.15 mg/mg[Cre]  CBC with Differential/Platelet     Status: Abnormal   Collection Time: 08/14/20  6:54 PM  Result Value Ref Range   WBC 7.1 4.0 - 10.5 K/uL   RBC 2.80 (L) 3.87 - 5.11 MIL/uL   Hemoglobin 8.4 (L) 12.0 - 15.0 g/dL   HCT 53.9 (L) 76.7 - 34.1 %   MCV 95.4 80.0 - 100.0 fL   MCH 30.0 26.0 -  34.0 pg   MCHC 31.5 30.0 -  36.0 g/dL   RDW 29.4 76.5 - 46.5 %   Platelets 265 150 - 400 K/uL   nRBC 0.0 0.0 - 0.2 %   Neutrophils Relative % 70 %   Neutro Abs 5.0 1.7 - 7.7 K/uL   Lymphocytes Relative 18 %   Lymphs Abs 1.2 0.7 - 4.0 K/uL   Monocytes Relative 11 %   Monocytes Absolute 0.8 0.1 - 1.0 K/uL   Eosinophils Relative 1 %   Eosinophils Absolute 0.1 0.0 - 0.5 K/uL   Basophils Relative 0 %   Basophils Absolute 0.0 0.0 - 0.1 K/uL   Immature Granulocytes 0 %   Abs Immature Granulocytes 0.03 0.00 - 0.07 K/uL  Comprehensive metabolic panel     Status: Abnormal   Collection Time: 08/14/20  6:54 PM  Result Value Ref Range   Sodium 134 (L) 135 - 145 mmol/L   Potassium 3.8 3.5 - 5.1 mmol/L   Chloride 106 98 - 111 mmol/L   CO2 21 (L) 22 - 32 mmol/L   Glucose, Bld 78 70 - 99 mg/dL   BUN <5 (L) 6 - 20 mg/dL   Creatinine, Ser 0.35 0.44 - 1.00 mg/dL   Calcium 8.3 (L) 8.9 - 10.3 mg/dL   Total Protein 6.1 (L) 6.5 - 8.1 g/dL   Albumin 2.9 (L) 3.5 - 5.0 g/dL   AST 18 15 - 41 U/L   ALT 14 0 - 44 U/L   Alkaline Phosphatase 60 38 - 126 U/L   Total Bilirubin 0.5 0.3 - 1.2 mg/dL   GFR, Estimated >46 >56 mL/min   Anion gap 7 5 - 15   No results found.  MDM Physical Exam Labs: CBC, CMP, PC Ratio Measure BPQ15 min EFM Pain Management Assessment and Plan  20 year old G1P0  TIUP at 24.6weeks Cat I FT x 2 Headache Nausea Elevated Blood Pressure  -POC Reviewed -FHR obtained with BSUS. Activity noted x 2 -Labs ordered. -Discussed treatment of HA with tylenol and nausea with Zofran. -Patient agreeable and without questions or concerns. -Fetus difficult to trace and nurse instructed to discontinue monitoring.  -Will await results.   Cherre Robins MSN, CNM 08/14/2020, 6:55 PM   Reassessment (8:19 PM)  -Results return normal. -Will plan for in office bp check in 2 days at primary office.  Lanice Shirts, CNM to bedside for reassessment and discharge instructions. -Message sent to  [facility] for blood pressure check. -Discharged to home in stable condition.  Cherre Robins MSN, CNM Advanced Practice Provider, [facility] [employer]

## 2020-08-14 NOTE — MAU Note (Signed)
Pt reports that since Friday her blood pressure has been spiking. Pt reports blood pressure 145/85. Pt reports headaches that have been coming and going.   Pt reports nausea and vomiting today. Pt reports dizziness and feeling like she cant get her vision corrected. Pt reports that she has been very thirsty.

## 2020-08-16 ENCOUNTER — Other Ambulatory Visit: Payer: Self-pay

## 2020-08-16 ENCOUNTER — Ambulatory Visit (INDEPENDENT_AMBULATORY_CARE_PROVIDER_SITE_OTHER): Payer: Medicaid Other | Admitting: Family Medicine

## 2020-08-16 VITALS — BP 133/74 | HR 117 | Wt 162.0 lb

## 2020-08-16 DIAGNOSIS — O30041 Twin pregnancy, dichorionic/diamniotic, first trimester: Secondary | ICD-10-CM

## 2020-08-16 DIAGNOSIS — O0991 Supervision of high risk pregnancy, unspecified, first trimester: Secondary | ICD-10-CM

## 2020-08-16 DIAGNOSIS — D508 Other iron deficiency anemias: Secondary | ICD-10-CM

## 2020-08-16 DIAGNOSIS — Z0130 Encounter for examination of blood pressure without abnormal findings: Secondary | ICD-10-CM

## 2020-08-16 MED ORDER — BACLOFEN 10 MG PO TABS: 10.0000 mg | ORAL_TABLET | Freq: Three times a day (TID) | ORAL | 0 refills | Status: DC

## 2020-08-16 NOTE — Progress Notes (Signed)
   PRENATAL VISIT NOTE  Subjective:  Alexis [name] is a 20 y.o. G1P0000 at [redacted]w[redacted]d being seen today for ongoing prenatal care.  She is currently monitored for the following issues for this high-risk pregnancy and has GERD (gastroesophageal reflux disease); Dichorionic diamniotic twin pregnancy in first trimester; and High-risk pregnancy, first trimester on their problem list.  Patient reports headache x 1 week. Constantly there. Gets somewhat better with tylenol. Some dizziness, lightheadedness. Was seen in MAU. Had one elevated BP.Marland Kitchen  Contractions: Not present. Vag. Bleeding: None.  Movement: Present. Denies leaking of fluid.   The following portions of the patient's history were reviewed and updated as appropriate: allergies, current medications, past family history, past medical history, past social history, past surgical history and problem list.   Objective:   Vitals:   08/16/20 1427  BP: 133/74  Pulse: (!) 117  Weight: 162 lb (73.5 kg)    Fetal Status: Fetal Heart Rate (bpm): 150/160   Movement: Present     General:  Alert, oriented and cooperative. Patient is in no acute distress.  Skin: Skin is warm and dry. No rash noted.   Cardiovascular: Normal heart rate noted  Respiratory: Normal respiratory effort, no problems with respiration noted  Abdomen: Soft, gravid, appropriate for gestational age.  Pain/Pressure: Absent     Pelvic: Cervical exam deferred        Extremities: Normal range of motion.  Edema: None  Mental Status: Normal mood and affect. Normal behavior. Normal judgment and thought content.   Assessment and Plan:  Pregnancy: G1P0000 at [redacted]w[redacted]d 1. BP check BP within range - high normal  2. High-risk pregnancy, first trimester FHT normal. Baclofen for headaches.  3. Dichorionic diamniotic twin pregnancy in first trimester  4. Iron deficiency anemia secondary to inadequate dietary iron intake Quite anemic. Will arrange for outpatient infusions.  Preterm labor  symptoms and general obstetric precautions including but not limited to vaginal bleeding, contractions, leaking of fluid and fetal movement were reviewed in detail with the patient. Please refer to After Visit Summary for other counseling recommendations.   No follow-ups on file.  Future Appointments  Date Time Provider Department Center  09/05/2020  8:15 AM [facility] NURSE [facility]  09/05/2020  8:30 AM [facility]  09/06/2020  8:15 AM [name], DO [facility] None    [name], DO

## 2020-08-16 NOTE — Progress Notes (Signed)
Patient presents for bp check from MAU visit on 08-14-2020. Patient states she has still had headaches that are not going away and lightheaded feeling.

## 2020-08-17 ENCOUNTER — Ambulatory Visit: Payer: Medicaid Other

## 2020-08-17 ENCOUNTER — Other Ambulatory Visit (HOSPITAL_COMMUNITY): Payer: Self-pay

## 2020-08-23 ENCOUNTER — Encounter (HOSPITAL_COMMUNITY)
Admission: RE | Admit: 2020-08-23 | Discharge: 2020-08-23 | Disposition: A | Discharge status: Home / Self Care | Payer: Medicaid Other | Source: Ambulatory Visit | Attending: Family Medicine | Admitting: Family Medicine

## 2020-08-23 ENCOUNTER — Other Ambulatory Visit: Payer: Self-pay

## 2020-08-23 DIAGNOSIS — Z3A00 Weeks of gestation of pregnancy not specified: Secondary | ICD-10-CM | POA: Insufficient documentation

## 2020-08-23 DIAGNOSIS — O99019 Anemia complicating pregnancy, unspecified trimester: Secondary | ICD-10-CM | POA: Insufficient documentation

## 2020-08-23 DIAGNOSIS — D509 Iron deficiency anemia, unspecified: Secondary | ICD-10-CM | POA: Insufficient documentation

## 2020-08-23 MED ORDER — SODIUM CHLORIDE 0.9 % IV SOLN
300.0000 mg | INTRAVENOUS | Status: DC
  Administered 2020-08-23: 300 mg via INTRAVENOUS
  Filled 2020-08-23: qty 15

## 2020-08-23 NOTE — Discharge Instructions (Signed)

## 2020-08-24 ENCOUNTER — Telehealth: Payer: Self-pay

## 2020-08-24 MED ORDER — FAMOTIDINE 20 MG PO TABS: 40.0000 mg | ORAL_TABLET | Freq: Two times a day (BID) | ORAL | 3 refills | Status: DC

## 2020-08-24 NOTE — Telephone Encounter (Signed)
Pt called stating she received an iron infusion on 08/23/20 and now she is having pain around her heart and under her ribs on the left side.A message will be sent to the provider.  Addison Whidbee l Nihar Klus, CMA

## 2020-08-27 ENCOUNTER — Other Ambulatory Visit: Payer: Self-pay

## 2020-08-27 ENCOUNTER — Inpatient Hospital Stay (HOSPITAL_COMMUNITY)
Admission: AD | Admit: 2020-08-27 | Discharge: 2020-08-27 | Disposition: A | Discharge status: Home / Self Care | Payer: Medicaid Other | Attending: Obstetrics and Gynecology | Admitting: Obstetrics and Gynecology

## 2020-08-27 ENCOUNTER — Encounter (HOSPITAL_COMMUNITY): Payer: Self-pay | Admitting: Obstetrics and Gynecology

## 2020-08-27 DIAGNOSIS — Z8249 Family history of ischemic heart disease and other diseases of the circulatory system: Secondary | ICD-10-CM | POA: Insufficient documentation

## 2020-08-27 DIAGNOSIS — O10912 Unspecified pre-existing hypertension complicating pregnancy, second trimester: Secondary | ICD-10-CM | POA: Insufficient documentation

## 2020-08-27 DIAGNOSIS — I10 Essential (primary) hypertension: Secondary | ICD-10-CM | POA: Diagnosis present

## 2020-08-27 DIAGNOSIS — O479 False labor, unspecified: Secondary | ICD-10-CM

## 2020-08-27 DIAGNOSIS — Z3A26 26 weeks gestation of pregnancy: Secondary | ICD-10-CM | POA: Insufficient documentation

## 2020-08-27 DIAGNOSIS — O30042 Twin pregnancy, dichorionic/diamniotic, second trimester: Secondary | ICD-10-CM | POA: Insufficient documentation

## 2020-08-27 DIAGNOSIS — O26892 Other specified pregnancy related conditions, second trimester: Secondary | ICD-10-CM | POA: Insufficient documentation

## 2020-08-27 DIAGNOSIS — R079 Chest pain, unspecified: Secondary | ICD-10-CM | POA: Diagnosis not present

## 2020-08-27 DIAGNOSIS — O4702 False labor before 37 completed weeks of gestation, second trimester: Secondary | ICD-10-CM | POA: Diagnosis not present

## 2020-08-27 DIAGNOSIS — K219 Gastro-esophageal reflux disease without esophagitis: Secondary | ICD-10-CM | POA: Diagnosis not present

## 2020-08-27 DIAGNOSIS — O99612 Diseases of the digestive system complicating pregnancy, second trimester: Secondary | ICD-10-CM | POA: Diagnosis not present

## 2020-08-27 DIAGNOSIS — O10919 Unspecified pre-existing hypertension complicating pregnancy, unspecified trimester: Secondary | ICD-10-CM

## 2020-08-27 HISTORY — DX: Essential (primary) hypertension: I10

## 2020-08-27 LAB — CBC WITH DIFFERENTIAL/PLATELET
Abs Immature Granulocytes: 0.09 10*3/uL — ABNORMAL HIGH (ref 0.00–0.07)
Basophils Absolute: 0 10*3/uL (ref 0.0–0.1)
Basophils Relative: 0 %
Eosinophils Absolute: 0.1 10*3/uL (ref 0.0–0.5)
Eosinophils Relative: 1 %
HCT: 26.8 % — ABNORMAL LOW (ref 36.0–46.0)
Hemoglobin: 8.9 g/dL — ABNORMAL LOW (ref 12.0–15.0)
Immature Granulocytes: 1 %
Lymphocytes Relative: 14 %
Lymphs Abs: 1.1 10*3/uL (ref 0.7–4.0)
MCH: 32.1 pg (ref 26.0–34.0)
MCHC: 33.2 g/dL (ref 30.0–36.0)
MCV: 96.8 fL (ref 80.0–100.0)
Monocytes Absolute: 0.6 10*3/uL (ref 0.1–1.0)
Monocytes Relative: 8 %
Neutro Abs: 5.5 10*3/uL (ref 1.7–7.7)
Neutrophils Relative %: 76 %
Platelets: 247 10*3/uL (ref 150–400)
RBC: 2.77 MIL/uL — ABNORMAL LOW (ref 3.87–5.11)
RDW: 14.4 % (ref 11.5–15.5)
WBC: 7.4 10*3/uL (ref 4.0–10.5)
nRBC: 0.3 % — ABNORMAL HIGH (ref 0.0–0.2)

## 2020-08-27 LAB — COMPREHENSIVE METABOLIC PANEL
ALT: 14 U/L (ref 0–44)
AST: 19 U/L (ref 15–41)
Albumin: 2.8 g/dL — ABNORMAL LOW (ref 3.5–5.0)
Alkaline Phosphatase: 69 U/L (ref 38–126)
Anion gap: 5 (ref 5–15)
BUN: 5 mg/dL — ABNORMAL LOW (ref 6–20)
CO2: 21 mmol/L — ABNORMAL LOW (ref 22–32)
Calcium: 8.3 mg/dL — ABNORMAL LOW (ref 8.9–10.3)
Chloride: 110 mmol/L (ref 98–111)
Creatinine, Ser: 0.67 mg/dL (ref 0.44–1.00)
GFR, Estimated: >60 mL/min (ref >60)
Glucose, Bld: 99 mg/dL (ref 70–99)
Potassium: 3.6 mmol/L (ref 3.5–5.1)
Sodium: 136 mmol/L (ref 135–145)
Total Bilirubin: 0.7 mg/dL (ref 0.3–1.2)
Total Protein: 5.9 g/dL — ABNORMAL LOW (ref 6.5–8.1)

## 2020-08-27 LAB — PROTEIN / CREATININE RATIO, URINE
Creatinine, Urine: 94.26 mg/dL
Protein Creatinine Ratio: 0.11 mg/mg{Cre} (ref 0.00–0.15)
Total Protein, Urine: 10 mg/dL

## 2020-08-27 LAB — TROPONIN I (HIGH SENSITIVITY): Troponin I (High Sensitivity): <2 ng/L (ref <18)

## 2020-08-27 MED ORDER — ALUM & MAG HYDROXIDE-SIMETH 200-200-20 MG/5ML PO SUSP
30.0000 mL | Freq: Once | ORAL | Status: AC
  Administered 2020-08-27: 30 mL via ORAL
  Filled 2020-08-27: qty 30

## 2020-08-27 MED ORDER — LIDOCAINE VISCOUS HCL 2 % MT SOLN
15.0000 mL | Freq: Once | OROMUCOSAL | Status: AC
  Administered 2020-08-27: 15 mL via ORAL
  Filled 2020-08-27: qty 15

## 2020-08-27 MED ORDER — GI COCKTAIL ~~LOC~~: 30.0000 mL | Freq: Two times a day (BID) | ORAL | 0 refills | Status: DC

## 2020-08-27 MED ORDER — PANTOPRAZOLE SODIUM 40 MG PO TBEC: 40.0000 mg | DELAYED_RELEASE_TABLET | Freq: Every day | ORAL | 1 refills | Status: DC

## 2020-08-27 NOTE — Discharge Instructions (Signed)
Food Choices for Gastroesophageal Reflux Disease, Adult When you have gastroesophageal reflux disease (GERD), the foods you eat and your eating habits are very important. Choosing the right foods can help ease your discomfort. Think about working with a food expert (dietitian) to help you make good choices. What are tips for following this plan? Reading food labels  Look for foods that are low in saturated fat. Foods that may help with your symptoms include: ? Foods that have less than 5% of daily value (DV) of fat. ? Foods that have 0 grams of trans fat. Cooking  Do not fry your food.  Cook your food by baking, steaming, grilling, or broiling. These are all methods that do not need a lot of fat for cooking.  To add flavor, try to use herbs that are low in spice and acidity. Meal planning  Choose healthy foods that are low in fat, such as: ? Fruits and vegetables. ? Whole grains. ? Low-fat dairy products. ? Lean meats, fish, and poultry.  Eat small meals often instead of eating 3 large meals each day. Eat your meals slowly in a place where you are relaxed. Avoid bending over or lying down until 2-3 hours after eating.  Limit high-fat foods such as fatty meats or fried foods.  Limit your intake of fatty foods, such as oils, butter, and shortening.  Avoid the following as told by your doctor: ? Foods that cause symptoms. These may be different for different people. Keep a food diary to keep track of foods that cause symptoms. ? Alcohol. ? Drinking a lot of liquid with meals. ? Eating meals during the 2-3 hours before bed.   Lifestyle  Stay at a healthy weight. Ask your doctor what weight is healthy for you. If you need to lose weight, work with your doctor to do so safely.  Exercise for at least 30 minutes on 5 or more days each week, or as told by your doctor.  Wear loose-fitting clothes.  Do not smoke or use any products that contain nicotine or tobacco. If you need help  quitting, ask your doctor.  Sleep with the head of your bed higher than your feet. Use a wedge under the mattress or blocks under the bed frame to raise the head of the bed.  Chew sugar-free gum after meals. What foods should eat? Eat a healthy, well-balanced diet of fruits, vegetables, whole grains, low-fat dairy products, lean meats, fish, and poultry. Each person is different. Foods that may cause symptoms in one person may not cause any symptoms in another person. Work with your doctor to find foods that are safe for you. The items listed above may not be a complete list of what you can eat and drink. Contact a food expert for more options.   What foods should I avoid? Limiting some of these foods may help in managing the symptoms of GERD. Everyone is different. Talk with a food expert or your doctor to help you find the exact foods to avoid, if any. Fruits Any fruits prepared with added fat. Any fruits that cause symptoms. For some people, this may include citrus fruits, such as oranges, grapefruit, pineapple, and lemons. Vegetables Deep-fried vegetables. French fries. Any vegetables prepared with added fat. Any vegetables that cause symptoms. For some people, this may include tomatoes and tomato products, chili peppers, onions and garlic, and horseradish. Grains Pastries or quick breads with added fat. Meats and other proteins High-fat meats, such as fatty beef or pork,   hot dogs, ribs, ham, sausage, salami, and bacon. Fried meat or protein, including fried fish and fried chicken. Nuts and nut butters, in large amounts. Dairy Whole milk and chocolate milk. Sour cream. Cream. Ice cream. Cream cheese. Milkshakes. Fats and oils Butter. Margarine. Shortening. Ghee. Beverages Coffee and tea, with or without caffeine. Carbonated beverages. Sodas. Energy drinks. Fruit juice made with acidic fruits, such as orange or grapefruit. Tomato juice. Alcoholic drinks. Sweets and desserts Chocolate and  cocoa. Donuts. Seasonings and condiments Pepper. Peppermint and spearmint. Added salt. Any condiments, herbs, or seasonings that cause symptoms. For some people, this may include curry, hot sauce, or vinegar-based salad dressings. The items listed above may not be a complete list of what you should not eat and drink. Contact a food expert for more options. Questions to ask your doctor Diet and lifestyle changes are often the first steps that are taken to manage symptoms of GERD. If diet and lifestyle changes do not help, talk with your doctor about taking medicines. Where to find more information  International Foundation for Gastrointestinal Disorders: aboutgerd.org Summary  When you have GERD, food and lifestyle choices are very important in easing your symptoms.  Eat small meals often instead of 3 large meals a day. Eat your meals slowly and in a place where you are relaxed.  Avoid bending over or lying down until 2-3 hours after eating.  Limit high-fat foods such as fatty meats or fried foods. This information is not intended to replace advice given to you by your health care provider. Make sure you discuss any questions you have with your health care provider. Document Revised: 12/12/2019 Document Reviewed: 12/12/2019 Elsevier Patient Education  2021 Elsevier Inc.  

## 2020-08-27 NOTE — MAU Note (Signed)
Received iron infusion on the 10th, ever since then has been experiencing a burning in her chest, around her heart.  Has kind of traveled to this constant aching pain below her right rib. Also, since yesterday has been having a lot of Braxton Hicks contractions, really painful and kind of constant.  Never had pain like this around her heart.  OB had prescribed Pepcid, will take the pain away, but always comes back.

## 2020-08-27 NOTE — MAU Provider Note (Signed)
History     CSN: [id number]  Arrival date and time: 08/27/20 1153   Event Date/Time   First Provider Initiated Contact with Patient 08/27/20 1321      Chief Complaint  Patient presents with  . Contractions  . Chest Pain   HPI  Alexis [name] is a 20 y.o. female @ [redacted]w[redacted]d with di/di twins here with pain in her chest near her heart. The pain started last Thursday after she received an iron infusion. She reports the pain comes and goes. She reports taking pepcid which helps some however does not last. She is taking the pepcid 2 tabs BID. She reports the pain is worse when she takes a deep breath in. + fetal movement.  She reports her pain is 6/10.  She was taking Protonix and stopped it because she thought she couldn't take pepcid and protonix.   Diet recall: Today she cobb salad, bagel, banana, eggs.   No headaches, no swelling.   OB History    Gravida  1   Para  0   Term  0   Preterm  0   AB  0   Living  0     SAB  0   IAB  0   Ectopic  0   Multiple  0   Live Births  0           Past Medical History:  Diagnosis Date  . Anxiety   . COVID-19   . Depression   . GERD (gastroesophageal reflux disease)   . Tachycardia    no medications-intermittent    Past Surgical History:  Procedure Laterality Date  . NO PAST SURGERIES      Family History  Problem Relation Age of Onset  . Diabetes Mellitus II Mother   . Hypertension Mother   . Hypertension Father   . Stomach cancer Maternal Aunt   . Stomach cancer Paternal Grandmother     Social History   Tobacco Use  . Smoking status: Never Smoker  . Smokeless tobacco: Never Used  Vaping Use  . Vaping Use: Never used  Substance Use Topics  . Alcohol use: Not Currently  . Drug use: Not Currently    Allergies:  Allergies  Allergen Reactions  . Dust Mite Extract Hives  . Mixed Ragweed Hives    No medications prior to admission.   Results for orders placed or performed during the hospital  encounter of 08/27/20 (from the past 72 hour(s))  CBC with Differential/Platelet     Status: Abnormal   Collection Time: 08/27/20 12:22 PM  Result Value Ref Range   WBC 7.4 4.0 - 10.5 K/uL   RBC 2.77 (L) 3.87 - 5.11 MIL/uL   Hemoglobin 8.9 (L) 12.0 - 15.0 g/dL   HCT 42.5 (L) 95.6 - 38.7 %   MCV 96.8 80.0 - 100.0 fL   MCH 32.1 26.0 - 34.0 pg   MCHC 33.2 30.0 - 36.0 g/dL   RDW 56.4 33.2 - 95.1 %   Platelets 247 150 - 400 K/uL   nRBC 0.3 (H) 0.0 - 0.2 %   Neutrophils Relative % 76 %   Neutro Abs 5.5 1.7 - 7.7 K/uL   Lymphocytes Relative 14 %   Lymphs Abs 1.1 0.7 - 4.0 K/uL   Monocytes Relative 8 %   Monocytes Absolute 0.6 0.1 - 1.0 K/uL   Eosinophils Relative 1 %   Eosinophils Absolute 0.1 0.0 - 0.5 K/uL   Basophils Relative 0 %   Basophils Absolute  0.0 0.0 - 0.1 K/uL   Immature Granulocytes 1 %   Abs Immature Granulocytes 0.09 (H) 0.00 - 0.07 K/uL    Comment: Performed at [facility], [address] [place], [place] [address]  Troponin I (High Sensitivity)     Status: None   Collection Time: 08/27/20 12:22 PM  Result Value Ref Range   Troponin I (High Sensitivity) <2 <18 ng/L    Comment: (NOTE) Elevated high sensitivity troponin I (hsTnI) values and significant  changes across serial measurements may suggest ACS but many other  chronic and acute conditions are known to elevate hsTnI results.  Refer to the "Links" section for chest pain algorithms and additional  guidance. Performed at [facility], [address] [place], [place] [address]   Comprehensive metabolic panel     Status: Abnormal   Collection Time: 08/27/20 12:22 PM  Result Value Ref Range   Sodium 136 135 - 145 mmol/L   Potassium 3.6 3.5 - 5.1 mmol/L   Chloride 110 98 - 111 mmol/L   CO2 21 (L) 22 - 32 mmol/L   Glucose, Bld 99 70 - 99 mg/dL    Comment: Glucose reference range applies only to samples taken after fasting for at least 8 hours.   BUN 5 (L) 6 - 20 mg/dL   Creatinine, Ser 7.82 0.44  - 1.00 mg/dL   Calcium 8.3 (L) 8.9 - 10.3 mg/dL   Total Protein 5.9 (L) 6.5 - 8.1 g/dL   Albumin 2.8 (L) 3.5 - 5.0 g/dL   AST 19 15 - 41 U/L   ALT 14 0 - 44 U/L   Alkaline Phosphatase 69 38 - 126 U/L   Total Bilirubin 0.7 0.3 - 1.2 mg/dL   GFR, Estimated >42 >35 mL/min    Comment: (NOTE) Calculated using the CKD-EPI Creatinine Equation (2021)    Anion gap 5 5 - 15    Comment: Performed at [facility], [address] [place], [place] [address]  Protein / creatinine ratio, urine     Status: None   Collection Time: 08/27/20  1:29 PM  Result Value Ref Range   Creatinine, Urine 94.26 mg/dL   Total Protein, Urine 10 mg/dL    Comment: NO NORMAL RANGE ESTABLISHED FOR THIS TEST   Protein Creatinine Ratio 0.11 0.00 - 0.15 mg/mg[Cre]    Comment: Performed at [facility], [address], [place], [place] [address]    Review of Systems  Constitutional: Negative for fever.  Respiratory: Negative for chest tightness and shortness of breath.   Cardiovascular: Positive for chest pain. Negative for palpitations and leg swelling.   Physical Exam   Blood pressure 131/70, pulse (!) 103, temperature 98.5 F (36.9 C), temperature source Oral, resp. rate 18, height 5\' 5"  (1.651 m), weight 77.4 kg, last menstrual period 02/22/2020, SpO2 100 %.     No data found.  Physical Exam Constitutional:      General: She is not in acute distress.    Appearance: She is well-developed. She is not ill-appearing, toxic-appearing or diaphoretic.  HENT:     Head: Normocephalic.  Cardiovascular:     Rate and Rhythm: Tachycardia present.     Heart sounds: Normal heart sounds.  Pulmonary:     Effort: Pulmonary effort is normal.  Genitourinary:    Comments: Cervix: closed, thick, posterior.  Exam by: 04/23/2020, NP  Skin:    General: Skin is warm.  Neurological:     Mental Status:  She is alert and oriented to person, place, and time.    Fetal Tracing: Baseline: 150 bpm Variability:  Moderate  Accelerations: 10x10 Decelerations: none Toco: quiet   Fetal Tracing: Baseline: 140 bpm Variability: Moderate  Accelerations: 10x10 Decelerations: None Toco: quiet    MAU Course  Procedures  None  MDM  PIH labs Patient with a few elevated BP readings. Hx of CHTN, not on meds. PIH labs reassuring.  EKG: normal sinus tachycardia, reviewed by myself.  Gi Cocktail given: patient with significant improvement.   Assessment and Plan   A:  1. Gastroesophageal reflux disease, unspecified whether esophagitis present   2. Chronic hypertension affecting pregnancy   3. Dichorionic diamniotic twin pregnancy in second trimester   4. Braxton Hicks contractions     P:  Discharge home in stable condition Message sent to the [facility] office for cardiology referral  Return to MAU if symptoms worsen  Rx: Protonix- encouraged using this   Alexis [name], Harolyn Rutherford, NP  08/29/2020 11:04 AM]

## 2020-08-29 ENCOUNTER — Telehealth: Payer: Self-pay

## 2020-08-29 ENCOUNTER — Other Ambulatory Visit: Payer: Self-pay | Admitting: Family Medicine

## 2020-08-29 NOTE — Telephone Encounter (Signed)
Pt called wanting to discuss taking oral iron instead of IV iron. Pt states she did not like the side effects that she had after her first iron infusion. Pt states she is going to reschedule her next iron infusion until she speaks with the provider.  Alexis [name] l Emett Stapel, CMA

## 2020-08-30 ENCOUNTER — Telehealth: Payer: Self-pay

## 2020-08-30 ENCOUNTER — Encounter (HOSPITAL_COMMUNITY)
Admission: RE | Admit: 2020-08-30 | Discharge: 2020-08-30 | Disposition: A | Discharge status: Home / Self Care | Payer: Medicaid Other | Source: Ambulatory Visit | Attending: Family Medicine | Admitting: Family Medicine

## 2020-08-30 ENCOUNTER — Other Ambulatory Visit: Payer: Self-pay

## 2020-08-30 DIAGNOSIS — O99019 Anemia complicating pregnancy, unspecified trimester: Secondary | ICD-10-CM | POA: Diagnosis not present

## 2020-08-30 MED ORDER — METHYLPREDNISOLONE SODIUM SUCC 125 MG IJ SOLR: 125.0000 mg | INTRAMUSCULAR | Status: DC

## 2020-08-30 MED ORDER — FAMOTIDINE 200 MG/20ML IV SOLN
40.0000 mg | INTRAVENOUS | Status: DC
  Administered 2020-08-30: 40 mg via INTRAVENOUS
  Filled 2020-08-30 (×2): qty 4

## 2020-08-30 MED ORDER — SODIUM CHLORIDE 0.9 % IV SOLN
300.0000 mg | INTRAVENOUS | Status: DC
  Administered 2020-08-30: 300 mg via INTRAVENOUS
  Filled 2020-08-30: qty 15

## 2020-08-30 MED ORDER — METHYLPREDNISOLONE SODIUM SUCC 125 MG IJ SOLR
INTRAMUSCULAR | Status: AC
  Administered 2020-08-30: 125 mg via INTRAVENOUS
  Filled 2020-08-30: qty 2

## 2020-08-30 NOTE — Progress Notes (Addendum)
Pt venofer infusion completed, pt c/o pain to right back/chest with exhale, denies SOB, no urticaria noted, denies N/V. Dr Adrian Blackwater office closed, [facility] ER doctor called, update allergy list and ok to d/c pt, Will continue to monitor.

## 2020-08-30 NOTE — Telephone Encounter (Signed)
Pt called stating she was prescribed Solumedrol. Pt wanted to know if there were any side effects with taking the steroids. Pt made aware that there aren't side effects because she wouldn't be taking the medication long term. Understanding was voiced. Desjuan Stearns l Weaver Tweed, CMA

## 2020-09-04 ENCOUNTER — Other Ambulatory Visit: Payer: Self-pay | Admitting: Family Medicine

## 2020-09-04 ENCOUNTER — Encounter (HOSPITAL_COMMUNITY): Payer: Medicaid Other

## 2020-09-05 ENCOUNTER — Encounter: Payer: Self-pay | Admitting: *Deleted

## 2020-09-05 ENCOUNTER — Other Ambulatory Visit: Payer: Self-pay

## 2020-09-05 ENCOUNTER — Ambulatory Visit: Payer: Medicaid Other | Attending: Obstetrics

## 2020-09-05 ENCOUNTER — Other Ambulatory Visit: Payer: Self-pay | Admitting: *Deleted

## 2020-09-05 ENCOUNTER — Ambulatory Visit: Payer: Medicaid Other | Admitting: *Deleted

## 2020-09-05 DIAGNOSIS — O30043 Twin pregnancy, dichorionic/diamniotic, third trimester: Secondary | ICD-10-CM

## 2020-09-05 DIAGNOSIS — O10013 Pre-existing essential hypertension complicating pregnancy, third trimester: Secondary | ICD-10-CM

## 2020-09-05 DIAGNOSIS — O359XX2 Maternal care for (suspected) fetal abnormality and damage, unspecified, fetus 2: Secondary | ICD-10-CM

## 2020-09-05 DIAGNOSIS — O30041 Twin pregnancy, dichorionic/diamniotic, first trimester: Secondary | ICD-10-CM | POA: Insufficient documentation

## 2020-09-05 DIAGNOSIS — I10 Essential (primary) hypertension: Secondary | ICD-10-CM | POA: Diagnosis present

## 2020-09-05 DIAGNOSIS — O0991 Supervision of high risk pregnancy, unspecified, first trimester: Secondary | ICD-10-CM | POA: Diagnosis present

## 2020-09-05 DIAGNOSIS — Z3A28 28 weeks gestation of pregnancy: Secondary | ICD-10-CM

## 2020-09-05 DIAGNOSIS — O30049 Twin pregnancy, dichorionic/diamniotic, unspecified trimester: Secondary | ICD-10-CM | POA: Insufficient documentation

## 2020-09-06 ENCOUNTER — Encounter (HOSPITAL_COMMUNITY): Payer: Medicaid Other

## 2020-09-06 ENCOUNTER — Ambulatory Visit (INDEPENDENT_AMBULATORY_CARE_PROVIDER_SITE_OTHER): Payer: Medicaid Other | Admitting: Family Medicine

## 2020-09-06 VITALS — BP 118/74 | HR 88 | Wt 170.0 lb

## 2020-09-06 DIAGNOSIS — D508 Other iron deficiency anemias: Secondary | ICD-10-CM

## 2020-09-06 DIAGNOSIS — Z3A28 28 weeks gestation of pregnancy: Secondary | ICD-10-CM

## 2020-09-06 DIAGNOSIS — O30042 Twin pregnancy, dichorionic/diamniotic, second trimester: Secondary | ICD-10-CM

## 2020-09-06 DIAGNOSIS — K219 Gastro-esophageal reflux disease without esophagitis: Secondary | ICD-10-CM

## 2020-09-06 DIAGNOSIS — O0991 Supervision of high risk pregnancy, unspecified, first trimester: Secondary | ICD-10-CM

## 2020-09-06 DIAGNOSIS — O10919 Unspecified pre-existing hypertension complicating pregnancy, unspecified trimester: Secondary | ICD-10-CM

## 2020-09-06 NOTE — Progress Notes (Signed)
   PRENATAL VISIT NOTE  Subjective:  Alexis [name] is a 20 y.o. G1P0000 at [redacted]w[redacted]d being seen today for ongoing prenatal care.  She is currently monitored for the following issues for this high-risk pregnancy and has GERD (gastroesophageal reflux disease); Dichorionic diamniotic twin pregnancy in first trimester; High-risk pregnancy, first trimester; and Chronic hypertension on their problem list.  Patient reports occasional leg cramping at night.  Contractions: Not present. Vag. Bleeding: None.  Movement: Present. Denies leaking of fluid.   The following portions of the patient's history were reviewed and updated as appropriate: allergies, current medications, past family history, past medical history, past social history, past surgical history and problem list.   Objective:   Vitals:   09/06/20 0808  BP: 118/74  Pulse: 88  Weight: 170 lb (77.1 kg)    Fetal Status: Fetal Heart Rate (bpm): 137/147   Movement: Present     General:  Alert, oriented and cooperative. Patient is in no acute distress.  Skin: Skin is warm and dry. No rash noted.   Cardiovascular: Normal heart rate noted  Respiratory: Normal respiratory effort, no problems with respiration noted  Abdomen: Soft, gravid, appropriate for gestational age.  Pain/Pressure: Present     Pelvic: Cervical exam deferred        Extremities: Normal range of motion.  Edema: None  Mental Status: Normal mood and affect. Normal behavior. Normal judgment and thought content.   Assessment and Plan:  Pregnancy: G1P0000 at [redacted]w[redacted]d 1. [redacted] weeks gestation of pregnancy - CBC - Glucose Tolerance, 2 Hours w/1 Hour - HIV Antibody (routine testing w rflx) - RPR  2. High-risk pregnancy, first trimester FHT and FH normal  3. Dichorionic diamniotic twin pregnancy in second trimester EFW 49%/54%  4. Chronic hypertension affecting pregnancy BP controlled Continue ASA 81mg   5. Gastroesophageal reflux disease, unspecified whether esophagitis  present On protonix  6. Iron deficiency anemia secondary to inadequate dietary iron intake Had side effect with Venofer, even with premedication with solumedrol and pepcid.  Will try feraheme with premedication. Start oral iron every other day.  Discussed to take a couple hours prior to taking protonix and to take with vitamin C to help with absorption.   Preterm labor symptoms and general obstetric precautions including but not limited to vaginal bleeding, contractions, leaking of fluid and fetal movement were reviewed in detail with the patient. Please refer to After Visit Summary for other counseling recommendations.   No follow-ups on file.  Future Appointments  Date Time Provider Department Center  09/07/2020  3:45 PM 09/09/2020, MD [facility]  10/03/2020  1:45 PM [facility] NURSE [facility]  10/03/2020  2:00 PM [facility] US1 [facility]  10/10/2020  9:30 AM [facility] NURSE [facility]  10/10/2020  9:45 AM [facility] US5 [facility]    10/12/2020, DO

## 2020-09-07 ENCOUNTER — Other Ambulatory Visit: Payer: Self-pay

## 2020-09-07 ENCOUNTER — Inpatient Hospital Stay (HOSPITAL_COMMUNITY)
Admission: AD | Admit: 2020-09-07 | Discharge: 2020-09-07 | Disposition: A | Discharge status: Home / Self Care | Payer: Medicaid Other | Attending: Obstetrics and Gynecology | Admitting: Obstetrics and Gynecology

## 2020-09-07 ENCOUNTER — Ambulatory Visit: Payer: Medicaid Other | Admitting: Internal Medicine

## 2020-09-07 ENCOUNTER — Encounter (HOSPITAL_COMMUNITY): Payer: Self-pay | Admitting: Obstetrics and Gynecology

## 2020-09-07 DIAGNOSIS — O0993 Supervision of high risk pregnancy, unspecified, third trimester: Secondary | ICD-10-CM | POA: Insufficient documentation

## 2020-09-07 DIAGNOSIS — M79605 Pain in left leg: Secondary | ICD-10-CM | POA: Insufficient documentation

## 2020-09-07 DIAGNOSIS — F32A Depression, unspecified: Secondary | ICD-10-CM | POA: Diagnosis not present

## 2020-09-07 DIAGNOSIS — Z3A28 28 weeks gestation of pregnancy: Secondary | ICD-10-CM | POA: Insufficient documentation

## 2020-09-07 DIAGNOSIS — F419 Anxiety disorder, unspecified: Secondary | ICD-10-CM | POA: Diagnosis not present

## 2020-09-07 DIAGNOSIS — O99891 Other specified diseases and conditions complicating pregnancy: Secondary | ICD-10-CM | POA: Diagnosis not present

## 2020-09-07 DIAGNOSIS — M7918 Myalgia, other site: Secondary | ICD-10-CM

## 2020-09-07 DIAGNOSIS — Z7982 Long term (current) use of aspirin: Secondary | ICD-10-CM | POA: Diagnosis not present

## 2020-09-07 DIAGNOSIS — O30043 Twin pregnancy, dichorionic/diamniotic, third trimester: Secondary | ICD-10-CM | POA: Diagnosis not present

## 2020-09-07 DIAGNOSIS — M79662 Pain in left lower leg: Secondary | ICD-10-CM | POA: Diagnosis not present

## 2020-09-07 DIAGNOSIS — O0991 Supervision of high risk pregnancy, unspecified, first trimester: Secondary | ICD-10-CM

## 2020-09-07 DIAGNOSIS — Z8616 Personal history of COVID-19: Secondary | ICD-10-CM | POA: Diagnosis not present

## 2020-09-07 DIAGNOSIS — R1030 Lower abdominal pain, unspecified: Secondary | ICD-10-CM

## 2020-09-07 DIAGNOSIS — Z79899 Other long term (current) drug therapy: Secondary | ICD-10-CM | POA: Insufficient documentation

## 2020-09-07 DIAGNOSIS — O10913 Unspecified pre-existing hypertension complicating pregnancy, third trimester: Secondary | ICD-10-CM | POA: Diagnosis not present

## 2020-09-07 DIAGNOSIS — O26893 Other specified pregnancy related conditions, third trimester: Secondary | ICD-10-CM | POA: Insufficient documentation

## 2020-09-07 DIAGNOSIS — O30041 Twin pregnancy, dichorionic/diamniotic, first trimester: Secondary | ICD-10-CM

## 2020-09-07 DIAGNOSIS — R109 Unspecified abdominal pain: Secondary | ICD-10-CM | POA: Diagnosis not present

## 2020-09-07 DIAGNOSIS — I10 Essential (primary) hypertension: Secondary | ICD-10-CM

## 2020-09-07 DIAGNOSIS — O99343 Other mental disorders complicating pregnancy, third trimester: Secondary | ICD-10-CM | POA: Insufficient documentation

## 2020-09-07 LAB — GLUCOSE TOLERANCE, 2 HOURS W/ 1HR
Glucose, 1 hour: 124 mg/dL (ref 65–179)
Glucose, 2 hour: 101 mg/dL (ref 65–152)
Glucose, Fasting: 75 mg/dL (ref 65–91)

## 2020-09-07 LAB — URINALYSIS, ROUTINE W REFLEX MICROSCOPIC
Bilirubin Urine: NEGATIVE
Glucose, UA: NEGATIVE mg/dL
Hgb urine dipstick: NEGATIVE
Ketones, ur: NEGATIVE mg/dL
Leukocytes,Ua: NEGATIVE
Nitrite: NEGATIVE
Protein, ur: NEGATIVE mg/dL
Specific Gravity, Urine: 1.006 (ref 1.005–1.030)
pH: 8 (ref 5.0–8.0)

## 2020-09-07 LAB — HIV ANTIBODY (ROUTINE TESTING W REFLEX): HIV Screen 4th Generation wRfx: NONREACTIVE

## 2020-09-07 LAB — CBC
Hematocrit: 29.9 % — ABNORMAL LOW (ref 34.0–46.6)
Hemoglobin: 9.9 g/dL — ABNORMAL LOW (ref 11.1–15.9)
MCH: 31.1 pg (ref 26.6–33.0)
MCHC: 33.1 g/dL (ref 31.5–35.7)
MCV: 94 fL (ref 79–97)
Platelets: 249 10*3/uL (ref 150–450)
RBC: 3.18 x10E6/uL — ABNORMAL LOW (ref 3.77–5.28)
RDW: 13.6 % (ref 11.7–15.4)
WBC: 6.8 10*3/uL (ref 3.4–10.8)

## 2020-09-07 LAB — RPR: RPR Ser Ql: NONREACTIVE

## 2020-09-07 NOTE — MAU Note (Signed)
Alexis [name] is a 20 y.o. at [redacted]w[redacted]d here in MAU reporting: 3 days ago slipped but caught herself before she fell. That night woke up with bad leg pain and has been sore and aching since then. Last night was having lower abdominal cramping, pain is intermittent and occurs a few times per hour. No bleeding or LOF. +FM  Onset of complaint: ongoing  Pain score: 7/10  Vitals:   09/07/20 0900 09/07/20 0901  BP: 138/86   Pulse: (!) 106   Resp: 16   Temp: 98.8 F (37.1 C)   SpO2:  99%     FHT: +FM, EFM applied in room  Lab orders placed from triage: UA

## 2020-09-07 NOTE — MAU Provider Note (Addendum)
History    CSN: [id number]  Arrival date and time: 09/07/20 7893   None    Chief Complaint  Patient presents with  . Abdominal Pain  . Leg Pain   HPI   A G1P0 [redacted]w[redacted]d by LMP 19yof with di/di twins presenting to the MAU with left calf pain. She states that she tripped 3 days ago and caught herself. She denies falling. Later that night she had sharp left calf pain, that she notes "felt like a charlie horse". Ever since then, she has had an achy soreness feeling in her left leg. She has taken tylenol for the pain with some relief. Denies history of blood clots. She reports abdominal cramping that she has been experiencing since yesterday. She states that the cramping will last 15-30 seconds and she can happen 2 times an hour. She describes it as a tightening sensation. Her last episode was this morning at 0630. Notes nausea that is ongoing through her pregnancy. Denies vaginal bleeding, chest pain, shortness of breath, fever and vomiting.   OB History    Gravida  1   Para  0   Term  0   Preterm  0   AB  0   Living  0     SAB  0   IAB  0   Ectopic  0   Multiple  0   Live Births  0           Past Medical History:  Diagnosis Date  . Anxiety   . COVID-19   . Depression   . GERD (gastroesophageal reflux disease)   . Tachycardia    no medications-intermittent    Past Surgical History:  Procedure Laterality Date  . NO PAST SURGERIES      Family History  Problem Relation Age of Onset  . Diabetes Mellitus II Mother   . Hypertension Mother   . Hypertension Father   . Stomach cancer Maternal Aunt   . Stomach cancer Paternal Grandmother     Social History   Tobacco Use  . Smoking status: Never Smoker  . Smokeless tobacco: Never Used  Vaping Use  . Vaping Use: Never used  Substance Use Topics  . Alcohol use: Not Currently  . Drug use: Not Currently    Allergies:  Allergies  Allergen Reactions  . Dust Mite Extract Hives  . Mixed Ragweed Hives  .  Venofer [Iron Sucrose] Other (See Comments)    Chest pain    Medications Prior to Admission  Medication Sig Dispense Refill Last Dose  . aspirin EC 81 MG tablet Take 81 mg by mouth daily. Swallow whole.     . cetirizine (ZYRTEC) 10 MG tablet Take 10 mg by mouth daily.     . Doxylamine-Pyridoxine (DICLEGIS) 10-10 MG TBEC Take 2 tablets by mouth at bedtime. If symptoms persist, add one tablet in the morning and one in the afternoon 100 tablet 5   . fluticasone (FLONASE) 50 MCG/ACT nasal spray Place 1 spray into both nostrils in the morning and at bedtime. 9.9 mL 2   . pantoprazole (PROTONIX) 40 MG tablet Take 1 tablet (40 mg total) by mouth daily. 30 tablet 1   . Prenatal Vit-Fe Fumarate-FA (PRENATAL VITAMINS PO) Take 1 tablet by mouth daily.     . sertraline (ZOLOFT) 50 MG tablet TAKE 1 TABLET BY MOUTH EVERY DAY 90 tablet 2     Review of Systems  Constitutional: Negative for fever.  Respiratory: Negative for shortness of breath.  Cardiovascular: Negative for chest pain and leg swelling.  Gastrointestinal: Positive for abdominal pain and nausea. Negative for vomiting.       Abdominal tightening sensation  Genitourinary: Negative for difficulty urinating, dysuria and vaginal bleeding.   Physical Exam   Blood pressure 123/74, pulse (!) 106, temperature 98.8 F (37.1 C), temperature source Oral, resp. rate 16, height 5\' 5"  (1.651 m), weight 77.8 kg, last menstrual period 02/22/2020, SpO2 97 %.  Physical Exam Vitals and nursing note reviewed.  Constitutional:      Appearance: Normal appearance.  HENT:     Head: Normocephalic.  Eyes:     Extraocular Movements: Extraocular movements intact.  Cardiovascular:     Rate and Rhythm: Regular rhythm. Tachycardia present.     Pulses:          Dorsalis pedis pulses are 2+ on the right side and 2+ on the left side.       Posterior tibial pulses are 2+ on the right side and 2+ on the left side.     Heart sounds: Normal heart sounds.   Pulmonary:     Effort: Pulmonary effort is normal.     Breath sounds: Normal breath sounds.  Genitourinary:    Comments: Cervical os is closed and long  Cervical exam done by: 04/23/2020 CNM Musculoskeletal:     Right lower leg: Normal.     Left lower leg: Tenderness present. No swelling.     Comments: 32 cm - R calf 31 cm - L calf  No erythema, warmth or swelling bilaterally. No palpable cord on L calf  Feet:     Comments: Sensation intact bilaterally Neurological:     General: No focal deficit present.     Mental Status: She is alert and oriented to person, place, and time.  Psychiatric:        Mood and Affect: Mood normal.        Behavior: Behavior normal.    MAU Course  Procedures Results for orders placed or performed during the hospital encounter of 09/07/20 (from the past 72 hour(s))  Urinalysis, Routine w reflex microscopic Urine, Clean Catch     Status: Abnormal   Collection Time: 09/07/20  8:50 AM  Result Value Ref Range   Color, Urine STRAW (A) YELLOW   APPearance CLEAR CLEAR   Specific Gravity, Urine 1.006 1.005 - 1.030   pH 8.0 5.0 - 8.0   Glucose, UA NEGATIVE NEGATIVE mg/dL   Hgb urine dipstick NEGATIVE NEGATIVE   Bilirubin Urine NEGATIVE NEGATIVE   Ketones, ur NEGATIVE NEGATIVE mg/dL   Protein, ur NEGATIVE NEGATIVE mg/dL   Nitrite NEGATIVE NEGATIVE   Leukocytes,Ua NEGATIVE NEGATIVE    Comment: Performed at [facility], [address] [place], [place]     MDM G1P0 [redacted]w[redacted]d by LMP 19yof with di/di twins in the MAU with complaints of left calf pain. She is sitting up comfortably in bed. Her lower leg physical exam was unremarkable. Low index of suspicion for DVT due to no swelling, no outside RF's besides her pregnancy, no significant family history/personal history of blood clots, and correlation of pain after catching herself from her fall. Cervical exam is closed and long. Category 1 fetal tracing. Reassured patient of likely  musculoskeletal calf pain and cramping due to pregnancy. Strict return precautions given for increased frequency and severity of cramping, as well as swelling of L calf, SOB and worsening leg pain. Patient is agreeable to plan and all questions have  been answered.   Assessment and Plan   L Calf Tenderness  -- Rest, ice and heat to help with pain  -- Tylenol PRN   -- If worsening calf pain or swelling, come back to the MAU or local ER  Lower abdominal cramping  -- If cramping increases to greater than 6 an hour or severity worsens, come back to the MAU  [redacted] week gestation Di/Di twins -- F/u with outpatient OB  Jeoffrey Massed , PA Student  09/07/2020, 9:54 AM   CNM attestation:  I have seen and examined this patient and agree with above documentation in the PA student's note.   Alexis [name] is a 20 y.o. G1P0000 at [redacted]w[redacted]d reporting pain in her left leg and abdominal cramping 2 x per hour. +FM, denies LOF, VB, contractions, vaginal discharge.  PE: Patient Vitals for the past 24 hrs:  BP Temp Temp src Pulse Resp SpO2 Height Weight  09/07/20 1145 130/73 98.9 F (37.2 C) Oral 100 17 100 % - -  09/07/20 0946 123/74 - - (!) 106 - 97 % - -  09/07/20 0931 129/71 - - (!) 102 - 98 % - -  09/07/20 0916 134/84 - - (!) 108 - 99 % - -  09/07/20 0901 - - - - - 99 % - -  09/07/20 0900 138/86 98.8 F (37.1 C) Oral (!) 106 16 - 5\' 5"  (1.651 m) 77.8 kg   Gen: calm comfortable, NAD Resp: normal effort, no distress Heart: Regular rate Abd: Soft, NT, gravid, S=D  Baby A:FHR: Baseline 145, moderate Variability, pos accels, no decels Baby B:FHR: Baseline 145, moderate Variability, pos accels, no decels Toco: UC's , none on toco or to palpation  ROS, labs, PMH reviewed  Orders Placed This Encounter  Procedures  . Urinalysis, Routine w reflex microscopic Urine, Clean Catch  . Discharge patient   No orders of the defined types were placed in this encounter.   MDM --No evidence of PTL  with closed and long cervix.  Pt without swelling, warmth or other abnormality of left leg and with known fall precipitating onset of pain.  Low suspicion for DVT. D/C today with both PTL and DVT precautions. Pt to return to MAU with emergencies.  Assessment: 1. Chronic hypertension   2. High-risk pregnancy, first trimester   3. Dichorionic diamniotic twin pregnancy in first trimester   4. Dichorionic diamniotic twin pregnancy in third trimester   5. Left leg pain   6. Musculoskeletal pain   7. Abdominal pain during pregnancy, third trimester     Plan: - Discharge home --Keep scheduled appt in office --Return to MAU with emergencies  , CNM 09/07/2020 5:16 PM

## 2020-09-07 NOTE — Discharge Instructions (Signed)
Reasons to return to MAU at [facility]:  Since you are preterm, return to MAU if:  1.  Contractions are 10 minutes apart or less and they becoming more uncomfortable or painful over time 2.  You have a large gush of fluid, or a trickle of fluid that will not stop and you have to wear a pad 3.  You have bleeding that is bright red, heavier than spotting--like menstrual bleeding (spotting can be normal in early labor or after a check of your cervix) 4.  You do not feel the baby moving like he/she normally does  

## 2020-09-12 ENCOUNTER — Other Ambulatory Visit: Payer: Self-pay

## 2020-09-12 ENCOUNTER — Ambulatory Visit (HOSPITAL_COMMUNITY)
Admission: RE | Admit: 2020-09-12 | Discharge: 2020-09-12 | Disposition: A | Discharge status: Home / Self Care | Payer: Medicaid Other | Source: Ambulatory Visit | Attending: Family Medicine | Admitting: Family Medicine

## 2020-09-12 DIAGNOSIS — D508 Other iron deficiency anemias: Secondary | ICD-10-CM | POA: Insufficient documentation

## 2020-09-12 MED ORDER — ALBUTEROL SULFATE (2.5 MG/3ML) 0.083% IN NEBU: 2.5000 mg | INHALATION_SOLUTION | Freq: Once | RESPIRATORY_TRACT | Status: DC | PRN

## 2020-09-12 MED ORDER — METHYLPREDNISOLONE SODIUM SUCC 125 MG IJ SOLR
INTRAMUSCULAR | Status: AC
  Filled 2020-09-12: qty 2

## 2020-09-12 MED ORDER — DIPHENHYDRAMINE HCL 50 MG/ML IJ SOLN
25.0000 mg | Freq: Once | INTRAMUSCULAR | Status: AC | PRN
  Administered 2020-09-12: 25 mg via INTRAVENOUS

## 2020-09-12 MED ORDER — ACETAMINOPHEN 500 MG PO TABS
ORAL_TABLET | ORAL | Status: AC
  Administered 2020-09-12: 1000 mg via ORAL
  Filled 2020-09-12: qty 2

## 2020-09-12 MED ORDER — SODIUM CHLORIDE 0.9 % IV SOLN
510.0000 mg | Freq: Once | INTRAVENOUS | Status: AC
  Administered 2020-09-12: 510 mg via INTRAVENOUS
  Filled 2020-09-12: qty 510

## 2020-09-12 MED ORDER — SODIUM CHLORIDE 0.9 % IV SOLN: INTRAVENOUS | Status: DC | PRN

## 2020-09-12 MED ORDER — LORATADINE 10 MG PO TABS
ORAL_TABLET | ORAL | Status: AC
  Administered 2020-09-12: 10 mg via ORAL
  Filled 2020-09-12: qty 1

## 2020-09-12 MED ORDER — ACETAMINOPHEN 500 MG PO TABS: 1000.0000 mg | ORAL_TABLET | Freq: Once | ORAL | Status: AC

## 2020-09-12 MED ORDER — METHYLPREDNISOLONE SODIUM SUCC 125 MG IJ SOLR
125.0000 mg | Freq: Once | INTRAMUSCULAR | Status: AC
  Administered 2020-09-12: 125 mg via INTRAVENOUS

## 2020-09-12 MED ORDER — EPINEPHRINE PF 1 MG/ML IJ SOLN: 0.3000 mg | Freq: Once | INTRAMUSCULAR | Status: DC | PRN

## 2020-09-12 MED ORDER — METHYLPREDNISOLONE SODIUM SUCC 125 MG IJ SOLR: 125.0000 mg | Freq: Once | INTRAMUSCULAR | Status: DC | PRN

## 2020-09-12 MED ORDER — LORATADINE 10 MG PO TABS: 10.0000 mg | ORAL_TABLET | Freq: Once | ORAL | Status: AC

## 2020-09-12 MED ORDER — DIPHENHYDRAMINE HCL 50 MG/ML IJ SOLN
INTRAMUSCULAR | Status: AC
  Filled 2020-09-12: qty 1

## 2020-09-12 MED ORDER — SODIUM CHLORIDE 0.9 % IV BOLUS: 500.0000 mL | Freq: Once | INTRAVENOUS | Status: DC | PRN

## 2020-09-12 NOTE — Progress Notes (Addendum)
Patient c/o throat feeling "tight", denies any trouble breathing. O2 sat 100. Patient in no apparent distress. Patient did receive all feraheme, and just waiting foe 30 min. Patient has been given 25mg  IV benadryl per orders.  Patient stated her throat is feeling better, less tight.

## 2020-09-12 NOTE — Progress Notes (Signed)
Patient states the tightness on throat is steadily going away. Patient stated she was feeling like she was fine to drive home.  Patient instructed to call doctor if she feels worse or not better.

## 2020-09-12 NOTE — Discharge Instructions (Signed)
Ferumoxytol injection What is this medicine? FERUMOXYTOL is an iron complex. Iron is used to make healthy red blood cells, which carry oxygen and nutrients throughout the body. This medicine is used to treat iron deficiency anemia. This medicine may be used for other purposes; ask your health care provider or pharmacist if you have questions. COMMON BRAND NAME(S): Feraheme What should I tell my health care provider before I take this medicine? They need to know if you have any of these conditions:  anemia not caused by low iron levels  high levels of iron in the blood  magnetic resonance imaging (MRI) test scheduled  an unusual or allergic reaction to iron, other medicines, foods, dyes, or preservatives  pregnant or trying to get pregnant  breast-feeding How should I use this medicine? This medicine is for injection into a vein. It is given by a health care professional in a hospital or clinic setting. Talk to your pediatrician regarding the use of this medicine in children. Special care may be needed. Overdosage: If you think you have taken too much of this medicine contact a poison control center or emergency room at once. NOTE: This medicine is only for you. Do not share this medicine with others. What if I miss a dose? It is important not to miss your dose. Call your doctor or health care professional if you are unable to keep an appointment. What may interact with this medicine? This medicine may interact with the following medications:  other iron products This list may not describe all possible interactions. Give your health care provider a list of all the medicines, herbs, non-prescription drugs, or dietary supplements you use. Also tell them if you smoke, drink alcohol, or use illegal drugs. Some items may interact with your medicine. What should I watch for while using this medicine? Visit your doctor or healthcare professional regularly. Tell your doctor or healthcare  professional if your symptoms do not start to get better or if they get worse. You may need blood work done while you are taking this medicine. You may need to follow a special diet. Talk to your doctor. Foods that contain iron include: whole grains/cereals, dried fruits, beans, or peas, leafy green vegetables, and organ meats (liver, kidney). What side effects may I notice from receiving this medicine? Side effects that you should report to your doctor or health care professional as soon as possible:  allergic reactions like skin rash, itching or hives, swelling of the face, lips, or tongue  breathing problems  changes in blood pressure  feeling faint or lightheaded, falls  fever or chills  flushing, sweating, or hot feelings  swelling of the ankles or feet Side effects that usually do not require medical attention (report to your doctor or health care professional if they continue or are bothersome):  diarrhea  headache  nausea, vomiting  stomach pain This list may not describe all possible side effects. Call your doctor for medical advice about side effects. You may report side effects to FDA at 1-800-FDA-1088. Where should I keep my medicine? This drug is given in a hospital or clinic and will not be stored at home. NOTE: This sheet is a summary. It may not cover all possible information. If you have questions about this medicine, talk to your doctor, pharmacist, or health care provider.  2021 Elsevier/Gold Standard (2016-07-21 20:21:10)  

## 2020-09-13 ENCOUNTER — Ambulatory Visit (INDEPENDENT_AMBULATORY_CARE_PROVIDER_SITE_OTHER): Payer: Medicaid Other | Admitting: Family Medicine

## 2020-09-13 VITALS — BP 115/67 | HR 103 | Wt 175.0 lb

## 2020-09-13 DIAGNOSIS — R399 Unspecified symptoms and signs involving the genitourinary system: Secondary | ICD-10-CM

## 2020-09-13 DIAGNOSIS — D508 Other iron deficiency anemias: Secondary | ICD-10-CM

## 2020-09-13 DIAGNOSIS — Z3A29 29 weeks gestation of pregnancy: Secondary | ICD-10-CM

## 2020-09-13 DIAGNOSIS — I10 Essential (primary) hypertension: Secondary | ICD-10-CM

## 2020-09-13 DIAGNOSIS — O30041 Twin pregnancy, dichorionic/diamniotic, first trimester: Secondary | ICD-10-CM

## 2020-09-13 DIAGNOSIS — O0991 Supervision of high risk pregnancy, unspecified, first trimester: Secondary | ICD-10-CM

## 2020-09-13 LAB — POCT URINALYSIS DIPSTICK OB
Bilirubin, UA: NEGATIVE
Blood, UA: NEGATIVE
Glucose, UA: NEGATIVE
Ketones, UA: NEGATIVE
Leukocytes, UA: NEGATIVE
Nitrite, UA: NEGATIVE
POC,PROTEIN,UA: NEGATIVE
Spec Grav, UA: 1.025 (ref 1.010–1.025)
Urobilinogen, UA: 0.2 E.U./dL
pH, UA: 6.5 (ref 5.0–8.0)

## 2020-09-13 NOTE — Progress Notes (Deleted)
Cardiology Office Note:    Date:  09/14/2020   ID:  Alexis [name], DOB January 19, 2001, MRN [id number]  PCP:  Patient, No Pcp Per (Inactive)  Cardiologist:  No primary care provider on file.  Electrophysiologist:  None   Referring MD: [name], DO   Chief Complaint  Patient presents with  . New Patient (Initial Visit)  . Headache  . Chest Pain    Pressure.  ***  History of Present Illness:    Alexis [name] is a 20 y.o. female with a hx of ***who is referred by Dr. Gust Rung for evaluation of chest pain.  She is currently***weeks pregnant    Past Medical History:  Diagnosis Date  . Anxiety   . COVID-19   . Depression   . GERD (gastroesophageal reflux disease)   . Tachycardia    no medications-intermittent    Past Surgical History:  Procedure Laterality Date  . NO PAST SURGERIES      Current Medications: Current Meds  Medication Sig  . aspirin EC 81 MG tablet Take 81 mg by mouth daily. Swallow whole.  . cetirizine (ZYRTEC) 10 MG tablet Take 10 mg by mouth daily.  . Doxylamine-Pyridoxine (DICLEGIS) 10-10 MG TBEC Take 2 tablets by mouth at bedtime. If symptoms persist, add one tablet in the morning and one in the afternoon  . fluticasone (FLONASE) 50 MCG/ACT nasal spray Place 1 spray into both nostrils in the morning and at bedtime.  . pantoprazole (PROTONIX) 40 MG tablet Take 1 tablet (40 mg total) by mouth daily.  . Prenatal Vit-Fe Fumarate-FA (PRENATAL VITAMINS PO) Take 1 tablet by mouth daily.  . sertraline (ZOLOFT) 50 MG tablet TAKE 1 TABLET BY MOUTH EVERY DAY     Allergies:   Dust mite extract, Mixed ragweed, and Venofer [iron sucrose]   Social History   Socioeconomic History  . Marital status: Single    Spouse name: Not on file  . Number of children: Not on file  . Years of education: Not on file  . Highest education level: Not on file  Occupational History  . Not on file  Tobacco Use  . Smoking status: Never Smoker  . Smokeless tobacco:  Never Used  Vaping Use  . Vaping Use: Never used  Substance and Sexual Activity  . Alcohol use: Not Currently  . Drug use: Not Currently  . Sexual activity: Yes    Birth control/protection: None  Other Topics Concern  . Not on file  Social History Narrative  . Not on file   Social Determinants of Health   Financial Resource Strain: Not on file  Food Insecurity: Not on file  Transportation Needs: Not on file  Physical Activity: Not on file  Stress: Not on file  Social Connections: Not on file     Family History: The patient's ***family history includes Diabetes Mellitus II in her mother; Hypertension in her father and mother; Stomach cancer in her maternal aunt and paternal grandmother.  ROS:   Please see the history of present illness.    *** All other systems reviewed and are negative.  EKGs/Labs/Other Studies Reviewed:    The following studies were reviewed today: ***  EKG:  EKG is *** ordered today.  The ekg ordered today demonstrates ***  Recent Labs: 08/27/2020: ALT 14; BUN 5; Creatinine, Ser 0.67; Potassium 3.6; Sodium 136 09/06/2020: Hemoglobin 9.9; Platelets 249  Recent Lipid Panel    Component Value Date/Time   CHOL 192 (H) 03/29/2019 1501   TRIG 99 (  H) 03/29/2019 1501   HDL 57 03/29/2019 1501   LDLCALC 117 (H) 03/29/2019 1501    Physical Exam:    VS:  BP 116/62 (BP Location: Right Arm, Patient Position: Sitting, Cuff Size: Normal)   Pulse (!) 107   Ht 5\' 5"  (1.651 m)   Wt 177 lb (80.3 kg)   LMP 02/22/2020 (Exact Date)   BMI 29.45 kg/m     Wt Readings from Last 3 Encounters:  09/14/20 177 lb (80.3 kg) (94 %, Z= 1.54)*  09/13/20 175 lb (79.4 kg) (93 %, Z= 1.50)*  09/07/20 171 lb 8 oz (77.8 kg) (92 %, Z= 1.42)*   * Growth percentiles are based on CDC (Girls, 2-20 Years) data.     GEN: *** Well nourished, well developed in no acute distress HEENT: Normal NECK: No JVD; No carotid bruits LYMPHATICS: No lymphadenopathy CARDIAC: ***RRR, no  murmurs, rubs, gallops RESPIRATORY:  Clear to auscultation without rales, wheezing or rhonchi  ABDOMEN: Soft, non-tender, non-distended MUSCULOSKELETAL:  No edema; No deformity  SKIN: Warm and dry NEUROLOGIC:  Alert and oriented x 3 PSYCHIATRIC:  Normal affect   ASSESSMENT:    1. SOB (shortness of breath)   2. Palpitations   3. Chest pain of uncertain etiology    PLAN:    Dyspnea/chest pain: Reports worsening dyspnea over the last 6 weeks.  This occurred after COVID-19 infection.  Also having pleuritic chest pain, which seems to be improving, not in last 2 weeks.  However continues to be short of breath.  Recommend echocardiogram to rule out myocardial involvement from recent COVID-19 infection  Palpitations: Description concerning for arrhythmia, will evaluate with Zio patch x3 days  RTC in 2 months   Medication Adjustments/Labs and Tests Ordered: Current medicines are reviewed at length with the patient today.  Concerns regarding medicines are outlined above.  Orders Placed This Encounter  Procedures  . LONG TERM MONITOR (3-14 DAYS)  . EKG 12-Lead  . ECHOCARDIOGRAM COMPLETE   No orders of the defined types were placed in this encounter.   There are no Patient Instructions on file for this visit.   Signed, 09/09/20, MD  09/14/2020 2:34 PM    [facility]

## 2020-09-13 NOTE — Progress Notes (Signed)
   PRENATAL VISIT NOTE  Subjective:  Alexis [name] is a 20 y.o. G1P0000 at [redacted]w[redacted]d being seen today for ongoing prenatal care.  She is currently monitored for the following issues for this high-risk pregnancy and has GERD (gastroesophageal reflux disease); Dichorionic diamniotic twin pregnancy in first trimester; High-risk pregnancy, first trimester; and Chronic hypertension on their problem list.  Patient reports mild burning and pressure with urination. had last iron infusion yesterday.  Contractions: Not present. Vag. Bleeding: None.  Movement: Present. Denies leaking of fluid.   The following portions of the patient's history were reviewed and updated as appropriate: allergies, current medications, past family history, past medical history, past social history, past surgical history and problem list.   Objective:   Vitals:   09/13/20 0829  BP: 115/67  Pulse: (!) 103  Weight: 175 lb (79.4 kg)    Fetal Status: Fetal Heart Rate (bpm): 133/140   Movement: Present     General:  Alert, oriented and cooperative. Patient is in no acute distress.  Skin: Skin is warm and dry. No rash noted.   Cardiovascular: Normal heart rate noted  Respiratory: Normal respiratory effort, no problems with respiration noted  Abdomen: Soft, gravid, appropriate for gestational age.  Pain/Pressure: Present     Pelvic: Cervical exam deferred        Extremities: Normal range of motion.  Edema: None  Mental Status: Normal mood and affect. Normal behavior. Normal judgment and thought content.   Assessment and Plan:  Pregnancy: G1P0000 at [redacted]w[redacted]d 1. [redacted] weeks gestation of pregnancy - Culture, OB Urine  2. High-risk pregnancy, first trimester  3. Dichorionic diamniotic twin pregnancy in first trimester FHT normal.  EFW 49%/54% Next [place] 4/20  4. Chronic hypertension BP controlled  5. Iron deficiency anemia secondary to inadequate dietary iron intake S/p 3 iron infusions. Tolerated feraheme better than  venofer.   6. UTI symptoms UA neg. Check Culture - POC Urinalysis Dipstick OB - Culture, OB Urine  Preterm labor symptoms and general obstetric precautions including but not limited to vaginal bleeding, contractions, leaking of fluid and fetal movement were reviewed in detail with the patient. Please refer to After Visit Summary for other counseling recommendations.   No follow-ups on file.  Future Appointments  Date Time Provider Department Center  09/14/2020  2:00 PM Little Ishikawa, MD [facility]  09/26/2020 10:00 AM [name], DO [facility] None  10/03/2020  1:45 PM [facility] NURSE [facility]  10/03/2020  2:00 PM [facility] US1 [facility]  10/10/2020  9:30 AM [facility] NURSE [facility]  10/10/2020  9:45 AM [facility] US5 [facility]  10/18/2020  8:45 AM [name], DO [facility] None  10/31/2020  8:45 AM [name], DO [facility] None  11/08/2020  8:45 AM Willodean Rosenthal, MD [facility] None    [name], DO

## 2020-09-14 ENCOUNTER — Ambulatory Visit (INDEPENDENT_AMBULATORY_CARE_PROVIDER_SITE_OTHER): Payer: Medicaid Other | Admitting: Cardiology

## 2020-09-14 ENCOUNTER — Other Ambulatory Visit: Payer: Self-pay

## 2020-09-14 ENCOUNTER — Ambulatory Visit (INDEPENDENT_AMBULATORY_CARE_PROVIDER_SITE_OTHER): Payer: Medicaid Other

## 2020-09-14 ENCOUNTER — Encounter: Payer: Self-pay | Admitting: Radiology

## 2020-09-14 VITALS — BP 116/62 | HR 107 | Ht 65.0 in | Wt 177.0 lb

## 2020-09-14 DIAGNOSIS — R0602 Shortness of breath: Secondary | ICD-10-CM

## 2020-09-14 DIAGNOSIS — R002 Palpitations: Secondary | ICD-10-CM

## 2020-09-14 DIAGNOSIS — R079 Chest pain, unspecified: Secondary | ICD-10-CM | POA: Diagnosis not present

## 2020-09-14 NOTE — Progress Notes (Signed)
Enrolled patient for a 3 day Zio XT monitor to be mailed to patients home  

## 2020-09-14 NOTE — Progress Notes (Signed)
Cardiology Office Note:    Date:  09/14/2020   ID:  Alexis [name], DOB January 16, 2001, MRN [id number]  PCP:  Patient, No Pcp Per (Inactive)  Cardiologist:  No primary care provider on file.  Electrophysiologist:  None   Referring MD: Levie HeritageStinson, Jacob J, DO   Chief Complaint  Patient presents with  . New Patient (Initial Visit)  . Headache  . Chest Pain    Pressure.    History of Present Illness:    Alexis [name] is a 20 y.o. female with a hx of tachycardia who is referred by Dr. Gust RungStenson for evaluation of chest pain.  She is currently [redacted] weeks pregnant. She has been having chest pain for 1 month and was recently seen in the ED 3 weeks ago for similar symptoms. She feels a sharp and stabbing pain in the center to left chest once every few days but none in the last few weeks. She note that it last for a few seconds and then resolves. It worsens with her taking deep breaths. She also has lightheadedness and shortness of breath during these episodes. She has palpitations once a day. She had COVID in January and she has since fully recovered. She denies having any complications with COVID. She has no syncope or LE edema. On 3/1 Hgb 8.4 down from 12.8 04/09/20. She was dx with iron deficiency anemia and has been receiving iron infusions her recent hemoglobin on 3/24 was 9.9. She has no smoking history.  No history of heart disease in her immediate family.    Past Medical History:  Diagnosis Date  . Anxiety   . COVID-19   . Depression   . GERD (gastroesophageal reflux disease)   . Tachycardia    no medications-intermittent    Past Surgical History:  Procedure Laterality Date  . NO PAST SURGERIES      Current Medications: Current Meds  Medication Sig  . aspirin EC 81 MG tablet Take 81 mg by mouth daily. Swallow whole.  . cetirizine (ZYRTEC) 10 MG tablet Take 10 mg by mouth daily.  . Doxylamine-Pyridoxine (DICLEGIS) 10-10 MG TBEC Take 2 tablets by mouth at bedtime. If symptoms  persist, add one tablet in the morning and one in the afternoon  . fluticasone (FLONASE) 50 MCG/ACT nasal spray Place 1 spray into both nostrils in the morning and at bedtime.  . pantoprazole (PROTONIX) 40 MG tablet Take 1 tablet (40 mg total) by mouth daily.  . Prenatal Vit-Fe Fumarate-FA (PRENATAL VITAMINS PO) Take 1 tablet by mouth daily.  . sertraline (ZOLOFT) 50 MG tablet TAKE 1 TABLET BY MOUTH EVERY DAY     Allergies:   Dust mite extract, Mixed ragweed, and Venofer [iron sucrose]   Social History   Socioeconomic History  . Marital status: Single    Spouse name: Not on file  . Number of children: Not on file  . Years of education: Not on file  . Highest education level: Not on file  Occupational History  . Not on file  Tobacco Use  . Smoking status: Never Smoker  . Smokeless tobacco: Never Used  Vaping Use  . Vaping Use: Never used  Substance and Sexual Activity  . Alcohol use: Not Currently  . Drug use: Not Currently  . Sexual activity: Yes    Birth control/protection: None  Other Topics Concern  . Not on file  Social History Narrative  . Not on file   Social Determinants of Health   Financial Resource Strain: Not on file  Food Insecurity: Not on file  Transportation Needs: Not on file  Physical Activity: Not on file  Stress: Not on file  Social Connections: Not on file     Family History: The patient's family history includes Diabetes Mellitus II in her mother; Hypertension in her father and mother; Stomach cancer in her maternal aunt and paternal grandmother.  ROS:   Please see the history of present illness.    (+)chest pain(center and left region) (+)palpitations (+)lightheadedness (+)SOB  All other systems reviewed and are negative.  EKGs/Labs/Other Studies Reviewed:    The following studies were reviewed today:   EKG:  Sinus tachycardia, rate: 107, no ST abnormalities  Recent Labs: 08/27/2020: ALT 14; BUN 5; Creatinine, Ser 0.67; Potassium  3.6; Sodium 136 09/06/2020: Hemoglobin 9.9; Platelets 249  Recent Lipid Panel    Component Value Date/Time   CHOL 192 (H) 03/29/2019 1501   TRIG 99 (H) 03/29/2019 1501   HDL 57 03/29/2019 1501   LDLCALC 117 (H) 03/29/2019 1501    Physical Exam:    VS:  BP 116/62 (BP Location: Right Arm, Patient Position: Sitting, Cuff Size: Normal)   Pulse (!) 107   Ht 5\' 5"  (1.651 m)   Wt 177 lb (80.3 kg)   LMP 02/22/2020 (Exact Date)   BMI 29.45 kg/m     Wt Readings from Last 3 Encounters:  09/14/20 177 lb (80.3 kg) (94 %, Z= 1.54)*  09/13/20 175 lb (79.4 kg) (93 %, Z= 1.50)*  09/07/20 171 lb 8 oz (77.8 kg) (92 %, Z= 1.42)*   * Growth percentiles are based on CDC (Girls, 2-20 Years) data.     GEN: Well nourished, well developed in no acute distress HEENT: Normal NECK: No JVD; No carotid bruits LYMPHATICS: No lymphadenopathy CARDIAC: RRR, no murmurs, rubs, gallops RESPIRATORY:  Clear to auscultation without rales, wheezing or rhonchi  ABDOMEN: Soft, non-tender, non-distended MUSCULOSKELETAL:  No edema; No deformity  SKIN: Warm and dry NEUROLOGIC:  Alert and oriented x 3 PSYCHIATRIC:  Normal affect   ASSESSMENT:    1. SOB (shortness of breath)   2. Palpitations   3. Chest pain of uncertain etiology    PLAN:    Dyspnea/chest pain: Reports worsening dyspnea over the last 6 weeks.  This occurred after COVID-19 infection.  Also having pleuritic chest pain, which seems to be improving, none in last 2 weeks.  However continues to be short of breath.  Recommend echocardiogram to rule out myocardial involvement from recent COVID-19 infection  Palpitations: Description concerning for arrhythmia, will evaluate with Zio patch x3 days  RTC in 2 months   Medication Adjustments/Labs and Tests Ordered: Current medicines are reviewed at length with the patient today.  Concerns regarding medicines are outlined above.  Orders Placed This Encounter  Procedures  . LONG TERM MONITOR (3-14 DAYS)   . EKG 12-Lead  . ECHOCARDIOGRAM COMPLETE   No orders of the defined types were placed in this encounter.   Patient Instructions  Medication Instructions:  No change   Lab Work: None ordered   Testing/Procedures: Echo   ZIO XT- Long Term Monitor Instructions   Your physician has requested you wear your ZIO patch monitor____3___days.   This is a single patch monitor.  [employer] supplies one patch monitor per enrollment.  Additional stickers are not available.   Please do not apply patch if you will be having a Nuclear Stress Test, Echocardiogram, Cardiac CT, MRI, or Chest Xray during the time frame you would be wearing the  monitor. The patch cannot be worn during these tests.  You cannot remove and re-apply the ZIO XT patch monitor.   Your ZIO patch monitor will be sent USPS Priority mail from [facility] directly to your home address. The monitor may also be mailed to a PO BOX if home delivery is not available.   It may take 3-5 days to receive your monitor after you have been enrolled.   Once you have received you monitor, please review enclosed instructions.  Your monitor has already been registered assigning a specific monitor serial # to you.   Applying the monitor   Shave hair from upper left chest.   Hold abrader disc by orange tab.  Rub abrader in 40 strokes over left upper chest as indicated in your monitor instructions.   Clean area with 4 enclosed alcohol pads .  Use all pads to assure are is cleaned thoroughly.  Let dry.   Apply patch as indicated in monitor instructions.  Patch will be place under collarbone on left side of chest with arrow pointing upward.   Rub patch adhesive wings for 2 minutes.Remove white label marked "1".  Remove white label marked "2".  Rub patch adhesive wings for 2 additional minutes.   While looking in a mirror, press and release button in center of patch.  A small green light will flash 3-4 times .  This will be your only  indicator the monitor has been turned on.     Do not shower for the first 24 hours.  You may shower after the first 24 hours.   Press button if you feel a symptom. You will hear a small click.  Record Date, Time and Symptom in the Patient Log Book.   When you are ready to remove patch, follow instructions on last 2 pages of Patient Log Book.  Stick patch monitor onto last page of Patient Log Book.   Place Patient Log Book in [place] box.  Use locking tab on box and tape box closed securely.  The Orange and [employer] has [employer] on it.  Please place in mailbox as soon as possible.  Your physician should have your test results approximately 7 days after the monitor has been mailed back to [facility].   Call [facility] Customer Care at [phone] if you have questions regarding your ZIO XT patch monitor.  Call them immediately if you see an orange light blinking on your monitor.   If your monitor falls off in less than 4 days contact our Monitor department at [phone].  If your monitor becomes loose or falls off after 4 days call [employer] at [phone] for suggestions on securing your monitor.     Follow-Up: At [facility], you and your health needs are our priority.  As part of our continuing mission to provide you with exceptional heart care, we have created designated Provider Care Teams.  These Care Teams include your primary Cardiologist (physician) and Advanced Practice Providers (APPs -  Physician Assistants and Nurse Practitioners) who all work together to provide you with the care you need, when you need it.  We recommend signing up for the patient portal called "MyChart".  Sign up information is provided on this After Visit Summary.  MyChart is used to connect with patients for Virtual Visits (Telemedicine).  Patients are able to view lab/test results, encounter notes, upcoming appointments, etc.  Non-urgent messages can be sent to your provider as well.   To  learn more about  what you can do with MyChart, go to [email or web address].    Your next appointment:  2 months   The format for your next appointment: Virtual    Provider: Dr.Jafet Wissing        I,Alexis Bryant,acting as a scribe for Little Ishikawa, MD.,have documented all relevant documentation on the behalf of Little Ishikawa, MD,as directed by  Little Ishikawa, MD while in the presence of Little Ishikawa, MD.  Signed, Little Ishikawa, MD  09/14/2020 2:36 PM    [facility]

## 2020-09-14 NOTE — Patient Instructions (Signed)
Medication Instructions:  No change   Lab Work: None ordered   Testing/Procedures: Echo   ZIO XT- Long Term Monitor Instructions   Your physician has requested you wear your ZIO patch monitor____3___days.   This is a single patch monitor.  [employer] supplies one patch monitor per enrollment.  Additional stickers are not available.   Please do not apply patch if you will be having a Nuclear Stress Test, Echocardiogram, Cardiac CT, MRI, or Chest Xray during the time frame you would be wearing the monitor. The patch cannot be worn during these tests.  You cannot remove and re-apply the ZIO XT patch monitor.   Your ZIO patch monitor will be sent USPS Priority mail from [facility] directly to your home address. The monitor may also be mailed to a PO BOX if home delivery is not available.   It may take 3-5 days to receive your monitor after you have been enrolled.   Once you have received you monitor, please review enclosed instructions.  Your monitor has already been registered assigning a specific monitor serial # to you.   Applying the monitor   Shave hair from upper left chest.   Hold abrader disc by orange tab.  Rub abrader in 40 strokes over left upper chest as indicated in your monitor instructions.   Clean area with 4 enclosed alcohol pads .  Use all pads to assure are is cleaned thoroughly.  Let dry.   Apply patch as indicated in monitor instructions.  Patch will be place under collarbone on left side of chest with arrow pointing upward.   Rub patch adhesive wings for 2 minutes.Remove white label marked "1".  Remove white label marked "2".  Rub patch adhesive wings for 2 additional minutes.   While looking in a mirror, press and release button in center of patch.  A small green light will flash 3-4 times .  This will be your only indicator the monitor has been turned on.     Do not shower for the first 24 hours.  You may shower after the first 24 hours.   Press  button if you feel a symptom. You will hear a small click.  Record Date, Time and Symptom in the Patient Log Book.   When you are ready to remove patch, follow instructions on last 2 pages of Patient Log Book.  Stick patch monitor onto last page of Patient Log Book.   Place Patient Log Book in [place] box.  Use locking tab on box and tape box closed securely.  The [employer] and [employer] has [employer] on it.  Please place in mailbox as soon as possible.  Your physician should have your test results approximately 7 days after the monitor has been mailed back to [facility].   Call [facility] Customer Care at [phone] if you have questions regarding your ZIO XT patch monitor.  Call them immediately if you see an orange light blinking on your monitor.   If your monitor falls off in less than 4 days contact our Monitor department at [phone].  If your monitor becomes loose or falls off after 4 days call [employer] at [phone] for suggestions on securing your monitor.     Follow-Up: At [facility], you and your health needs are our priority.  As part of our continuing mission to provide you with exceptional heart care, we have created designated Provider Care Teams.  These Care Teams include your primary Cardiologist (physician) and Advanced Practice Providers (  APPs -  Physician Assistants and Nurse Practitioners) who all work together to provide you with the care you need, when you need it.  We recommend signing up for the patient portal called "MyChart".  Sign up information is provided on this After Visit Summary.  MyChart is used to connect with patients for Virtual Visits (Telemedicine).  Patients are able to view lab/test results, encounter notes, upcoming appointments, etc.  Non-urgent messages can be sent to your provider as well.   To learn more about what you can do with MyChart, go to [email or web address].    Your next appointment:  2 months   The format for your  next appointment: Virtual    Provider: Dr.Schumann

## 2020-09-15 LAB — URINE CULTURE, OB REFLEX

## 2020-09-15 LAB — CULTURE, OB URINE

## 2020-09-16 ENCOUNTER — Inpatient Hospital Stay (HOSPITAL_COMMUNITY)
Admission: AD | Admit: 2020-09-16 | Discharge: 2020-09-16 | Disposition: A | Discharge status: Home / Self Care | Payer: Medicaid Other | Attending: Obstetrics and Gynecology | Admitting: Obstetrics and Gynecology

## 2020-09-16 ENCOUNTER — Encounter (HOSPITAL_COMMUNITY): Payer: Self-pay | Admitting: Obstetrics and Gynecology

## 2020-09-16 ENCOUNTER — Other Ambulatory Visit: Payer: Self-pay

## 2020-09-16 DIAGNOSIS — O26893 Other specified pregnancy related conditions, third trimester: Secondary | ICD-10-CM

## 2020-09-16 DIAGNOSIS — O23593 Infection of other part of genital tract in pregnancy, third trimester: Secondary | ICD-10-CM | POA: Diagnosis not present

## 2020-09-16 DIAGNOSIS — F32A Depression, unspecified: Secondary | ICD-10-CM | POA: Insufficient documentation

## 2020-09-16 DIAGNOSIS — O30041 Twin pregnancy, dichorionic/diamniotic, first trimester: Secondary | ICD-10-CM

## 2020-09-16 DIAGNOSIS — F419 Anxiety disorder, unspecified: Secondary | ICD-10-CM | POA: Diagnosis not present

## 2020-09-16 DIAGNOSIS — B9689 Other specified bacterial agents as the cause of diseases classified elsewhere: Secondary | ICD-10-CM | POA: Diagnosis not present

## 2020-09-16 DIAGNOSIS — Z8616 Personal history of COVID-19: Secondary | ICD-10-CM | POA: Diagnosis not present

## 2020-09-16 DIAGNOSIS — O30043 Twin pregnancy, dichorionic/diamniotic, third trimester: Secondary | ICD-10-CM | POA: Insufficient documentation

## 2020-09-16 DIAGNOSIS — O10013 Pre-existing essential hypertension complicating pregnancy, third trimester: Secondary | ICD-10-CM | POA: Diagnosis not present

## 2020-09-16 DIAGNOSIS — N760 Acute vaginitis: Secondary | ICD-10-CM

## 2020-09-16 DIAGNOSIS — R300 Dysuria: Secondary | ICD-10-CM | POA: Insufficient documentation

## 2020-09-16 DIAGNOSIS — O99343 Other mental disorders complicating pregnancy, third trimester: Secondary | ICD-10-CM | POA: Insufficient documentation

## 2020-09-16 DIAGNOSIS — Z3A29 29 weeks gestation of pregnancy: Secondary | ICD-10-CM

## 2020-09-16 LAB — URINALYSIS, ROUTINE W REFLEX MICROSCOPIC
Bilirubin Urine: NEGATIVE
Glucose, UA: 50 mg/dL — AB
Hgb urine dipstick: NEGATIVE
Ketones, ur: NEGATIVE mg/dL
Leukocytes,Ua: NEGATIVE
Nitrite: NEGATIVE
Protein, ur: NEGATIVE mg/dL
Specific Gravity, Urine: 1.015 (ref 1.005–1.030)
pH: 7 (ref 5.0–8.0)

## 2020-09-16 LAB — WET PREP, GENITAL
Sperm: NONE SEEN
Trich, Wet Prep: NONE SEEN
Yeast Wet Prep HPF POC: NONE SEEN

## 2020-09-16 MED ORDER — METRONIDAZOLE 500 MG PO TABS: 500.0000 mg | ORAL_TABLET | Freq: Two times a day (BID) | ORAL | 0 refills | Status: AC

## 2020-09-16 MED ORDER — PHENAZOPYRIDINE HCL 200 MG PO TABS: 200.0000 mg | ORAL_TABLET | Freq: Three times a day (TID) | ORAL | 0 refills | Status: AC | PRN

## 2020-09-16 NOTE — Discharge Instructions (Signed)

## 2020-09-16 NOTE — MAU Note (Signed)
Pt reports to mau with c/o "UTI symptoms" for the past week.  Pt states she was seen in the office for the symptoms on Thursday but her test showed no UTI.  Pt states she is still having urgency, burning, and the inability to empty her bladder completely.  Denies LOF or vag bleeding. +FM Reports some lower back pain for the past 2 days

## 2020-09-16 NOTE — MAU Provider Note (Signed)
History     [id number]  Arrival date and time: 09/16/20 0755    Chief Complaint  Patient presents with  . Urinary Frequency  . Abdominal Pain  . Back Pain     HPI Alexis [name] is a 20 y.o. at [redacted]w[redacted]d by early ultrasound with PMHx notable for di/di twins & CHTN (no meds), who presents for urinary complaints. Symptoms started a week ago. Reports dysuria, increased frequency, and urgency. Reports low back pain/cramping. Rates pain 8/10. Hasn't treated symptoms. Nothing makes better or worse. Had 1 hour episode of intermittent sharp abdominal pains last night that has not continued. Has noticed increase in nausea with her symptoms. Also reports increase in watery discharge with some associated vaginal irritation.  Denies fever/chills, vomiting, flank pain, hematuria, or vaginal bleeding. Reports good fetal movement x2.   Vaginal bleeding: No LOF: No Fetal Movement: Yes Contractions: No  O/Positive/-- (10/25 1417)  OB History    Gravida  1   Para  0   Term  0   Preterm  0   AB  0   Living  0     SAB  0   IAB  0   Ectopic  0   Multiple  0   Live Births  0           Past Medical History:  Diagnosis Date  . Anxiety   . COVID-19   . Depression   . GERD (gastroesophageal reflux disease)   . Tachycardia    no medications-intermittent    Past Surgical History:  Procedure Laterality Date  . NO PAST SURGERIES      Family History  Problem Relation Age of Onset  . Diabetes Mellitus II Mother   . Hypertension Mother   . Hypertension Father   . Stomach cancer Maternal Aunt   . Stomach cancer Paternal Grandmother     Social History   Socioeconomic History  . Marital status: Single    Spouse name: Not on file  . Number of children: Not on file  . Years of education: Not on file  . Highest education level: Not on file  Occupational History  . Not on file  Tobacco Use  . Smoking status: Never Smoker  . Smokeless tobacco: Never Used  Vaping Use   . Vaping Use: Never used  Substance and Sexual Activity  . Alcohol use: Not Currently  . Drug use: Not Currently  . Sexual activity: Yes    Birth control/protection: None  Other Topics Concern  . Not on file  Social History Narrative  . Not on file   Social Determinants of Health   Financial Resource Strain: Not on file  Food Insecurity: Not on file  Transportation Needs: Not on file  Physical Activity: Not on file  Stress: Not on file  Social Connections: Not on file  Intimate Partner Violence: Not on file    Allergies  Allergen Reactions  . Dust Mite Extract Hives  . Mixed Ragweed Hives  . Venofer [Iron Sucrose] Other (See Comments)    Chest pain    No current facility-administered medications on file prior to encounter.   Current Outpatient Medications on File Prior to Encounter  Medication Sig Dispense Refill  . aspirin EC 81 MG tablet Take 81 mg by mouth daily. Swallow whole.    . cetirizine (ZYRTEC) 10 MG tablet Take 10 mg by mouth daily.    . Doxylamine-Pyridoxine (DICLEGIS) 10-10 MG TBEC Take 2 tablets by mouth at bedtime. If  symptoms persist, add one tablet in the morning and one in the afternoon 100 tablet 5  . fluticasone (FLONASE) 50 MCG/ACT nasal spray Place 1 spray into both nostrils in the morning and at bedtime. 9.9 mL 2  . pantoprazole (PROTONIX) 40 MG tablet Take 1 tablet (40 mg total) by mouth daily. 30 tablet 1  . Prenatal Vit-Fe Fumarate-FA (PRENATAL VITAMINS PO) Take 1 tablet by mouth daily.    . sertraline (ZOLOFT) 50 MG tablet TAKE 1 TABLET BY MOUTH EVERY DAY 90 tablet 2     Review of Systems  Constitutional: Negative.   Gastrointestinal: Positive for nausea. Negative for abdominal pain, constipation, diarrhea and vomiting.  Genitourinary: Positive for dysuria, frequency and urgency. Negative for flank pain and hematuria.  Musculoskeletal: Positive for back pain.   Pertinent positives and negative per HPI, all others reviewed and  negative  Physical Exam   BP 134/65 (BP Location: Right Arm)   Pulse 97   Temp 98.8 F (37.1 C) (Oral)   Resp 16   LMP 02/22/2020 (Exact Date)   SpO2 100%   Physical Exam Vitals and nursing note reviewed. Exam conducted with a chaperone present.  Constitutional:      General: She is not in acute distress.    Appearance: She is well-developed and normal weight.  HENT:     Head: Normocephalic and atraumatic.  Pulmonary:     Effort: Pulmonary effort is normal. No respiratory distress.  Abdominal:     Tenderness: There is no right CVA tenderness or left CVA tenderness.  Genitourinary:    General: Normal vulva.     Exam position: Supine.  Skin:    General: Skin is warm and dry.  Neurological:     Mental Status: She is alert.  Psychiatric:        Mood and Affect: Mood normal.        Behavior: Behavior normal.     Cervical Exam Dilation: Closed Effacement (%): Thick Cervical Position: Posterior Station: -3 Exam by:: Judeth Horn NP    Fetal Tracing: Baby A Baseline: 140 Variability: moderate Accelerations: 10x10 Decelerations: none  Baby B Baseline: 135 Variability: moderate Accelerations: 15x15 Decelerations: none  Toco: none     Labs Results for orders placed or performed during the hospital encounter of 09/16/20 (from the past 24 hour(s))  Urinalysis, Routine w reflex microscopic Urine, Clean Catch     Status: Abnormal   Collection Time: 09/16/20  8:19 AM  Result Value Ref Range   Color, Urine YELLOW YELLOW   APPearance HAZY (A) CLEAR   Specific Gravity, Urine 1.015 1.005 - 1.030   pH 7.0 5.0 - 8.0   Glucose, UA 50 (A) NEGATIVE mg/dL   Hgb urine dipstick NEGATIVE NEGATIVE   Bilirubin Urine NEGATIVE NEGATIVE   Ketones, ur NEGATIVE NEGATIVE mg/dL   Protein, ur NEGATIVE NEGATIVE mg/dL   Nitrite NEGATIVE NEGATIVE   Leukocytes,Ua NEGATIVE NEGATIVE  Wet prep, genital     Status: Abnormal   Collection Time: 09/16/20  8:55 AM   Specimen: Cervix   Result Value Ref Range   Yeast Wet Prep HPF POC NONE SEEN NONE SEEN   Trich, Wet Prep NONE SEEN NONE SEEN   Clue Cells Wet Prep HPF POC PRESENT (A) NONE SEEN   WBC, Wet Prep HPF POC MODERATE (A) NONE SEEN   Sperm NONE SEEN     Imaging No results found.  MAU Course  Procedures  Lab Orders     Wet prep, genital  Culture, OB Urine     Urinalysis, Routine w reflex microscopic Urine, Clean Catch Meds ordered this encounter  Medications  . metroNIDAZOLE (FLAGYL) 500 MG tablet    Sig: Take 1 tablet (500 mg total) by mouth 2 (two) times daily for 7 days.    Dispense:  14 tablet    Refill:  0    Order Specific Question:   Supervising Provider    Answer:   ERVIN, MICHAEL L [[id number]]  . phenazopyridine (PYRIDIUM) 200 MG tablet    Sig: Take 1 tablet (200 mg total) by mouth 3 (three) times daily as needed for up to 3 days for pain.    Dispense:  10 tablet    Refill:  0    Order Specific Question:   Supervising Provider    Answer:   Nettie Elm L [[id number]]   Imaging Orders  No imaging studies ordered today    MDM U/a negative - will send for culture due to symptoms. Pt is afebrile & has not CVAT. Will trial pyridium for symptoms.   Wet prep + for clue cells - will rx flagyl d/t symptoms GC/CT pending  Cervix closed/thick Category 1 tracing x 2  Assessment and Plan   1. BV (bacterial vaginosis)   2. Dysuria during pregnancy in third trimester   3. Dichorionic diamniotic twin pregnancy in first trimester   4. [redacted] weeks gestation of pregnancy    -urine culture & GC/CT pending -Rx flagyl & pyridium -reviewed reasons to return to MAU  Judeth Horn, NP

## 2020-09-17 ENCOUNTER — Inpatient Hospital Stay (HOSPITAL_COMMUNITY): Payer: Medicaid Other

## 2020-09-17 ENCOUNTER — Encounter (HOSPITAL_COMMUNITY): Payer: Self-pay | Admitting: Family Medicine

## 2020-09-17 ENCOUNTER — Other Ambulatory Visit: Payer: Self-pay

## 2020-09-17 ENCOUNTER — Inpatient Hospital Stay (HOSPITAL_COMMUNITY)
Admission: AD | Admit: 2020-09-17 | Discharge: 2020-09-17 | Disposition: A | Discharge status: Home / Self Care | Payer: Medicaid Other | Attending: Family Medicine | Admitting: Family Medicine

## 2020-09-17 DIAGNOSIS — Z3A29 29 weeks gestation of pregnancy: Secondary | ICD-10-CM

## 2020-09-17 DIAGNOSIS — O26893 Other specified pregnancy related conditions, third trimester: Secondary | ICD-10-CM | POA: Diagnosis not present

## 2020-09-17 DIAGNOSIS — O10013 Pre-existing essential hypertension complicating pregnancy, third trimester: Secondary | ICD-10-CM | POA: Diagnosis not present

## 2020-09-17 DIAGNOSIS — R1011 Right upper quadrant pain: Secondary | ICD-10-CM

## 2020-09-17 DIAGNOSIS — O0991 Supervision of high risk pregnancy, unspecified, first trimester: Secondary | ICD-10-CM

## 2020-09-17 DIAGNOSIS — O30043 Twin pregnancy, dichorionic/diamniotic, third trimester: Secondary | ICD-10-CM | POA: Diagnosis not present

## 2020-09-17 DIAGNOSIS — Z7982 Long term (current) use of aspirin: Secondary | ICD-10-CM | POA: Insufficient documentation

## 2020-09-17 DIAGNOSIS — K5900 Constipation, unspecified: Secondary | ICD-10-CM | POA: Diagnosis not present

## 2020-09-17 DIAGNOSIS — K219 Gastro-esophageal reflux disease without esophagitis: Secondary | ICD-10-CM

## 2020-09-17 DIAGNOSIS — O99013 Anemia complicating pregnancy, third trimester: Secondary | ICD-10-CM | POA: Diagnosis not present

## 2020-09-17 DIAGNOSIS — Z79899 Other long term (current) drug therapy: Secondary | ICD-10-CM | POA: Diagnosis not present

## 2020-09-17 DIAGNOSIS — Z8616 Personal history of COVID-19: Secondary | ICD-10-CM | POA: Diagnosis not present

## 2020-09-17 DIAGNOSIS — R110 Nausea: Secondary | ICD-10-CM | POA: Diagnosis not present

## 2020-09-17 DIAGNOSIS — O99613 Diseases of the digestive system complicating pregnancy, third trimester: Secondary | ICD-10-CM | POA: Diagnosis not present

## 2020-09-17 DIAGNOSIS — D509 Iron deficiency anemia, unspecified: Secondary | ICD-10-CM

## 2020-09-17 DIAGNOSIS — I10 Essential (primary) hypertension: Secondary | ICD-10-CM

## 2020-09-17 DIAGNOSIS — O30041 Twin pregnancy, dichorionic/diamniotic, first trimester: Secondary | ICD-10-CM

## 2020-09-17 LAB — COMPREHENSIVE METABOLIC PANEL
ALT: 14 U/L (ref 0–44)
AST: 19 U/L (ref 15–41)
Albumin: 3 g/dL — ABNORMAL LOW (ref 3.5–5.0)
Alkaline Phosphatase: 79 U/L (ref 38–126)
Anion gap: 8 (ref 5–15)
BUN: 7 mg/dL (ref 6–20)
CO2: 21 mmol/L — ABNORMAL LOW (ref 22–32)
Calcium: 9.2 mg/dL (ref 8.9–10.3)
Chloride: 107 mmol/L (ref 98–111)
Creatinine, Ser: 0.55 mg/dL (ref 0.44–1.00)
GFR, Estimated: >60 mL/min (ref >60)
Glucose, Bld: 83 mg/dL (ref 70–99)
Potassium: 4 mmol/L (ref 3.5–5.1)
Sodium: 136 mmol/L (ref 135–145)
Total Bilirubin: 0.5 mg/dL (ref 0.3–1.2)
Total Protein: 6.4 g/dL — ABNORMAL LOW (ref 6.5–8.1)

## 2020-09-17 LAB — CBC
HCT: 31.2 % — ABNORMAL LOW (ref 36.0–46.0)
Hemoglobin: 10.1 g/dL — ABNORMAL LOW (ref 12.0–15.0)
MCH: 32.3 pg (ref 26.0–34.0)
MCHC: 32.4 g/dL (ref 30.0–36.0)
MCV: 99.7 fL (ref 80.0–100.0)
Platelets: 215 10*3/uL (ref 150–400)
RBC: 3.13 MIL/uL — ABNORMAL LOW (ref 3.87–5.11)
RDW: 15.6 % — ABNORMAL HIGH (ref 11.5–15.5)
WBC: 7.5 10*3/uL (ref 4.0–10.5)
nRBC: 0 % (ref 0.0–0.2)

## 2020-09-17 LAB — URINALYSIS, ROUTINE W REFLEX MICROSCOPIC
Bilirubin Urine: NEGATIVE
Glucose, UA: 150 mg/dL — AB
Hgb urine dipstick: NEGATIVE
Ketones, ur: NEGATIVE mg/dL
Leukocytes,Ua: NEGATIVE
Nitrite: NEGATIVE
Protein, ur: NEGATIVE mg/dL
Specific Gravity, Urine: 1.008 (ref 1.005–1.030)
pH: 7 (ref 5.0–8.0)

## 2020-09-17 LAB — GC/CHLAMYDIA PROBE AMP (~~LOC~~) NOT AT ARMC
Chlamydia: NEGATIVE
Comment: NEGATIVE
Comment: NORMAL
Neisseria Gonorrhea: NEGATIVE

## 2020-09-17 LAB — CULTURE, OB URINE: Special Requests: NORMAL

## 2020-09-17 LAB — LIPASE, BLOOD: Lipase: 29 U/L (ref 11–51)

## 2020-09-17 MED ORDER — CYCLOBENZAPRINE HCL 10 MG PO TABS: 10.0000 mg | ORAL_TABLET | Freq: Three times a day (TID) | ORAL | 0 refills | Status: DC | PRN

## 2020-09-17 MED ORDER — ACETAMINOPHEN 325 MG PO TABS
650.0000 mg | ORAL_TABLET | ORAL | Status: DC | PRN
  Administered 2020-09-17: 650 mg via ORAL
  Filled 2020-09-17: qty 2

## 2020-09-17 MED ORDER — CYCLOBENZAPRINE HCL 5 MG PO TABS
10.0000 mg | ORAL_TABLET | Freq: Once | ORAL | Status: AC
  Administered 2020-09-17: 10 mg via ORAL
  Filled 2020-09-17: qty 2

## 2020-09-17 NOTE — Discharge Instructions (Signed)
Take your Flexeril 10 mg as needed up to 3 times a day for abdominal pain.  Please follow-up on with your Doctor on 09/26/20.  Please come back sooner if you develop fever, worsening pain, significant vomiting, chest pain, or vaginal bleeding.

## 2020-09-17 NOTE — MAU Provider Note (Signed)
History    Chief Complaint  Patient presents with  . Abdominal Pain   Alexis [name] is a 20 y.o. at [redacted]w[redacted]d by early ultrasound with PMHx notable for di/di twins & CHTN presenting for abdominal pain.  Patient indicates last night came in with abdominal pain.  Indicates it started at 10 PM last night.  Describes it as sharp in RUQ and radiates downward.  Severe, issues with walking and hurts with deep breathing.  Rates as 9 on scale of 1-10.  Indicates able to eat oatmeal this morning, had some nausea but not vomiting.  Has history of GERD and takes Pantoprazole daily and indicates this pain is unlike previous GERD.  Indicates since taking iron due to anemia has had darker stools and constipation.  Has not had bowel movement since this pain started.    Patient also has history of Tachycardia.  Recently seen by Cardiologist had normal EKG.  Has ECHO scheduled for future and Zio Patch which she hasn't received yet.  Denies any chest pain, palpitations or dyspnea.  Also seen yesterday for increased Vaginal discharge.  Found to have BV and took Metronidazole at 5 PM yesterday.    Vaginal bleeding: No LOF: No Fetal Movement: Yes Contractions: No    OB History    Gravida  1   Para  0   Term  0   Preterm  0   AB  0   Living  0     SAB  0   IAB  0   Ectopic  0   Multiple  0   Live Births  0        Past Medical History:  Diagnosis Date  . Anxiety   . COVID-19   . Depression   . GERD (gastroesophageal reflux disease)   . Tachycardia    no medications-intermittent    Past Surgical History:  Procedure Laterality Date  . NO PAST SURGERIES      Family History  Problem Relation Age of Onset  . Diabetes Mellitus II Mother   . Hypertension Mother   . Hypertension Father   . Stomach cancer Maternal Aunt   . Stomach cancer Paternal Grandmother     Social History   Tobacco Use  . Smoking status: Never Smoker  . Smokeless tobacco: Never Used  Vaping Use  .  Vaping Use: Never used  Substance Use Topics  . Alcohol use: Not Currently  . Drug use: Not Currently    Allergies:  Allergies  Allergen Reactions  . Dust Mite Extract Hives  . Mixed Ragweed Hives  . Venofer [Iron Sucrose] Other (See Comments)    Chest pain    Medications Prior to Admission  Medication Sig Dispense Refill Last Dose  . acetaminophen (TYLENOL) 500 MG tablet Take 500 mg by mouth every 6 (six) hours as needed.   09/16/2020 at 2300  . aspirin EC 81 MG tablet Take 81 mg by mouth daily. Swallow whole.   09/16/2020 at Unknown time  . calcium carbonate (TUMS - DOSED IN MG ELEMENTAL CALCIUM) 500 MG chewable tablet Chew 1 tablet by mouth daily.   09/16/2020 at 2300  . cetirizine (ZYRTEC) 10 MG tablet Take 10 mg by mouth daily.   09/16/2020 at Unknown time  . Ferrous Sulfate Dried (HIGH POTENCY IRON) 65 MG TABS Take by mouth.   09/16/2020 at Unknown time  . pantoprazole (PROTONIX) 40 MG tablet Take 1 tablet (40 mg total) by mouth daily. 30 tablet 1 09/17/2020 at  0700  . Prenatal Vit-Fe Fumarate-FA (PRENATAL VITAMINS PO) Take 1 tablet by mouth daily.   09/16/2020 at Unknown time  . Doxylamine-Pyridoxine (DICLEGIS) 10-10 MG TBEC Take 2 tablets by mouth at bedtime. If symptoms persist, add one tablet in the morning and one in the afternoon 100 tablet 5   . fluticasone (FLONASE) 50 MCG/ACT nasal spray Place 1 spray into both nostrils in the morning and at bedtime. 9.9 mL 2   . metroNIDAZOLE (FLAGYL) 500 MG tablet Take 1 tablet (500 mg total) by mouth 2 (two) times daily for 7 days. 14 tablet 0   . phenazopyridine (PYRIDIUM) 200 MG tablet Take 1 tablet (200 mg total) by mouth 3 (three) times daily as needed for up to 3 days for pain. 10 tablet 0   . sertraline (ZOLOFT) 50 MG tablet TAKE 1 TABLET BY MOUTH EVERY DAY 90 tablet 2     Review of Systems  Constitutional: Negative for fever.  Respiratory: Negative for shortness of breath.   Cardiovascular: Negative for chest pain.  Gastrointestinal:  Positive for abdominal pain, constipation and nausea. Negative for blood in stool and vomiting.  Genitourinary: Positive for vaginal discharge. Negative for vaginal bleeding.  Neurological: Positive for weakness.   Physical Exam Blood pressure 133/65, pulse (!) 113, temperature 98.5 F (36.9 C), temperature source Oral, resp. rate 17, height 5\' 5"  (1.651 m), weight 79.7 kg, last menstrual period 02/22/2020, SpO2 100 %. Physical Exam Constitutional:      General: She is not in acute distress.    Appearance: She is not ill-appearing.  HENT:     Head: Normocephalic and atraumatic.  Cardiovascular:     Rate and Rhythm: Regular rhythm. Tachycardia present.     Heart sounds: Normal heart sounds.  Pulmonary:     Effort: Pulmonary effort is normal.     Breath sounds: Normal breath sounds.  Abdominal:     General: Bowel sounds are normal.     Tenderness: There is abdominal tenderness in the right upper quadrant. There is no guarding or rebound. Negative signs include Murphy's sign.  Skin:    General: Skin is warm.  Neurological:     Mental Status: She is alert.    Two separate reassuring FHR's noted. Vertex presentation confirmed.  MAU Course  CMP Latest Ref Rng & Units 09/17/2020 08/27/2020 08/14/2020  Glucose 70 - 99 mg/dL 83 99 78  BUN 6 - 20 mg/dL 7 5(L) 10/14/2020)  Creatinine 0.44 - 1.00 mg/dL <5(V 2.02 3.34  Sodium 135 - 145 mmol/L 136 136 134(L)  Potassium 3.5 - 5.1 mmol/L 4.0 3.6 3.8  Chloride 98 - 111 mmol/L 107 110 106  CO2 22 - 32 mmol/L 21(L) 21(L) 21(L)  Calcium 8.9 - 10.3 mg/dL 9.2 3.56) 8.3(L)  Total Protein 6.5 - 8.1 g/dL 6.4(L) 5.9(L) 6.1(L)  Total Bilirubin 0.3 - 1.2 mg/dL 0.5 0.7 0.5  Alkaline Phos 38 - 126 U/L 79 69 60  AST 15 - 41 U/L 19 19 18   ALT 0 - 44 U/L 14 14 14     CBC Latest Ref Rng & Units 09/17/2020 09/06/2020 08/27/2020  WBC 4.0 - 10.5 K/uL 7.5 6.8 7.4  Hemoglobin 12.0 - 15.0 g/dL 10.1(L) 9.9(L) 8.9(L)  Hematocrit 36.0 - 46.0 % 31.2(L) 29.9(L) 26.8(L)   Platelets 150 - 400 K/uL 215 249 247   Lipase- 29  EXAM: ULTRASOUND ABDOMEN LIMITED RIGHT UPPER QUADRANT  COMPARISON:  None.  FINDINGS: Gallbladder:  No gallstones or wall thickening visualized. No sonographic Murphy sign  noted by sonographer.  Common bile duct:  Diameter: 4 mm  Liver:  No focal lesion identified. Within normal limits in parenchymal echogenicity. Portal vein is patent on color Doppler imaging with normal direction of blood flow towards the liver.  Other: None.  IMPRESSION: Normal sonographic appearance of the liver and gallbladder   Electronically Signed   By: Acquanetta Belling M.D.   On: 09/17/2020 11:52  MDM #RUQ Pain RUQ tender to palpation.  No Peritoneal findings.  Obtained CBC, CMP, Lipase, and UA and RUQ ultrasound.  Labs and Ultrasound reassuring.  Fetal Ultrasound also reassuring.  Thought likely to abdominal musculoskeletal strain.  Patient given dose of 10 mg Flexeril and responded well to.  Determined safe to be discharged home and given return precautions.    Attestation of Attending Supervision of Resident: Evaluation and management procedures were performed by the [facility] Medicine Resident under my supervision. I was immediately available for direct supervision, assistance and direction throughout this encounter.  I also confirm that I have verified the information documented in the resident's note, and that I have also personally reperformed the pertinent components of the physical exam and all of the medical decision making activities.  I have also made any necessary editorial changes.  Patient having RUQ pain, not improved with PPI, tums. Started last night around 10pm. No radiation. No palliating or provoking factors.   BP 129/73 (BP Location: Right Arm)   Pulse 93   Temp 98.5 F (36.9 C) (Oral)   Resp 17   Ht 5\' 5"  (1.651 m)   Wt 79.7 kg   LMP 02/22/2020 (Exact Date)   SpO2 99%   BMI 29.22 kg/m  A&Ox3, NAD. RR, no  murmur CTA bilaterally RUQ tenderness. No rebound or guarding.  Labs reviewed.  RUQ 04/23/2020 negative  NST: baby A: 130 baseline. Mod variability, + accels. No decels Baby B: 130 baseline. Mod variability, + accels. No decels    ICD-10-CM   1. [redacted] weeks gestation of pregnancy  Z3A.29   2. Chronic hypertension  I10   3. High-risk pregnancy, first trimester  O09.91   4. Dichorionic diamniotic twin pregnancy in first trimester  O30.041   5. RUQ pain  R10.11 US Abdomen Limited RUQ (LIVER/GB)    US Abdomen Limited RUQ (LIVER/GB)  6. RUQ abdominal pain  R10.11    Improved some with flexeril. Will prescribe. Add heating pad prn. Follow up next week. Patient to call office if not improving. If worsens or develops other symptoms, return to MAU.  [place], DO Attending Obstetrician & Gynecologist, [facility], [facility] 09/17/2020 3:59 PM

## 2020-09-17 NOTE — Telephone Encounter (Signed)
Addressed in MAU visit

## 2020-09-17 NOTE — MAU Note (Signed)
Ever since about 10p.m. has been having sharp pain in RUQ, comes and goes, tightness that travels down abd.  Getting worse. Slight HA, back of her head, denies visual changes or swelling.  Some nause, has been constipated.

## 2020-09-17 NOTE — MAU Note (Signed)
RN unable to trace two separate FHR's. Patient endorses both babies are vertex. Dr. Pecola Leisure, MD at bedside discussing history and complaints with patient.   Dr. Adrian Blackwater, MD, notified of difficult FHR tracing. Dr. Adrian Blackwater, MD at bedside at 1045. Two separate reassuring FHR's noted. Vertex presentation confirmed.

## 2020-09-18 ENCOUNTER — Other Ambulatory Visit: Payer: Self-pay

## 2020-09-18 ENCOUNTER — Inpatient Hospital Stay (HOSPITAL_COMMUNITY)
Admission: AD | Admit: 2020-09-18 | Discharge: 2020-09-18 | Disposition: A | Discharge status: Home / Self Care | Payer: Medicaid Other | Attending: Obstetrics & Gynecology | Admitting: Obstetrics & Gynecology

## 2020-09-18 ENCOUNTER — Encounter (HOSPITAL_COMMUNITY): Payer: Self-pay | Admitting: Obstetrics & Gynecology

## 2020-09-18 DIAGNOSIS — Z7982 Long term (current) use of aspirin: Secondary | ICD-10-CM | POA: Diagnosis not present

## 2020-09-18 DIAGNOSIS — O30043 Twin pregnancy, dichorionic/diamniotic, third trimester: Secondary | ICD-10-CM | POA: Diagnosis not present

## 2020-09-18 DIAGNOSIS — O99613 Diseases of the digestive system complicating pregnancy, third trimester: Secondary | ICD-10-CM | POA: Diagnosis not present

## 2020-09-18 DIAGNOSIS — O210 Mild hyperemesis gravidarum: Secondary | ICD-10-CM | POA: Insufficient documentation

## 2020-09-18 DIAGNOSIS — Z3A29 29 weeks gestation of pregnancy: Secondary | ICD-10-CM | POA: Diagnosis not present

## 2020-09-18 DIAGNOSIS — O219 Vomiting of pregnancy, unspecified: Secondary | ICD-10-CM

## 2020-09-18 DIAGNOSIS — K219 Gastro-esophageal reflux disease without esophagitis: Secondary | ICD-10-CM | POA: Insufficient documentation

## 2020-09-18 DIAGNOSIS — O0991 Supervision of high risk pregnancy, unspecified, first trimester: Secondary | ICD-10-CM

## 2020-09-18 DIAGNOSIS — O30041 Twin pregnancy, dichorionic/diamniotic, first trimester: Secondary | ICD-10-CM

## 2020-09-18 DIAGNOSIS — Z8616 Personal history of COVID-19: Secondary | ICD-10-CM | POA: Diagnosis not present

## 2020-09-18 DIAGNOSIS — R1011 Right upper quadrant pain: Secondary | ICD-10-CM | POA: Diagnosis present

## 2020-09-18 DIAGNOSIS — I10 Essential (primary) hypertension: Secondary | ICD-10-CM

## 2020-09-18 DIAGNOSIS — O26893 Other specified pregnancy related conditions, third trimester: Secondary | ICD-10-CM | POA: Insufficient documentation

## 2020-09-18 LAB — URINALYSIS, ROUTINE W REFLEX MICROSCOPIC
Bilirubin Urine: NEGATIVE
Glucose, UA: 150 mg/dL — AB
Hgb urine dipstick: NEGATIVE
Ketones, ur: NEGATIVE mg/dL
Leukocytes,Ua: NEGATIVE
Nitrite: NEGATIVE
Protein, ur: NEGATIVE mg/dL
Specific Gravity, Urine: 1.023 (ref 1.005–1.030)
pH: 7 (ref 5.0–8.0)

## 2020-09-18 MED ORDER — ONDANSETRON 8 MG PO TBDP: 8.0000 mg | ORAL_TABLET | Freq: Three times a day (TID) | ORAL | 0 refills | Status: DC | PRN

## 2020-09-18 MED ORDER — LIDOCAINE VISCOUS HCL 2 % MT SOLN
15.0000 mL | Freq: Once | OROMUCOSAL | Status: AC
  Administered 2020-09-18: 15 mL via ORAL
  Filled 2020-09-18: qty 15

## 2020-09-18 MED ORDER — ALUM & MAG HYDROXIDE-SIMETH 200-200-20 MG/5ML PO SUSP
30.0000 mL | Freq: Once | ORAL | Status: AC
  Administered 2020-09-18: 30 mL via ORAL
  Filled 2020-09-18: qty 30

## 2020-09-18 MED ORDER — ONDANSETRON 4 MG PO TBDP
8.0000 mg | ORAL_TABLET | Freq: Three times a day (TID) | ORAL | Status: DC | PRN
  Administered 2020-09-18: 8 mg via ORAL
  Filled 2020-09-18: qty 2

## 2020-09-18 NOTE — Discharge Instructions (Signed)
Gastroesophageal Reflux Disease, Adult  Gastroesophageal reflux (GER) happens when acid from the stomach flows up into the tube that connects the mouth and the stomach (esophagus). Normally, food travels down the esophagus and stays in the stomach to be digested. With GER, food and stomach acid sometimes move back up into the esophagus. You may have a disease called gastroesophageal reflux disease (GERD) if the reflux:  Happens often.  Causes frequent or very bad symptoms.  Causes problems such as damage to the esophagus. When this happens, the esophagus becomes sore and swollen. Over time, GERD can make small holes (ulcers) in the lining of the esophagus. What are the causes? This condition is caused by a problem with the muscle between the esophagus and the stomach. When this muscle is weak or not normal, it does not close properly to keep food and acid from coming back up from the stomach. The muscle can be weak because of:  Tobacco use.  Pregnancy.  Having a certain type of hernia (hiatal hernia).  Alcohol use.  Certain foods and drinks, such as coffee, chocolate, onions, and peppermint. What increases the risk?  Being overweight.  Having a disease that affects your connective tissue.  Taking NSAIDs, such a ibuprofen. What are the signs or symptoms?  Heartburn.  Difficult or painful swallowing.  The feeling of having a lump in the throat.  A bitter taste in the mouth.  Bad breath.  Having a lot of saliva.  Having an upset or bloated stomach.  Burping.  Chest pain. Different conditions can cause chest pain. Make sure you see your doctor if you have chest pain.  Shortness of breath or wheezing.  A long-term cough or a cough at night.  Wearing away of the surface of teeth (tooth enamel).  Weight loss. How is this treated?  Making changes to your diet.  Taking medicine.  Having surgery. Treatment will depend on how bad your symptoms are. Follow these  instructions at home: Eating and drinking  Follow a diet as told by your doctor. You may need to avoid foods and drinks such as: ? Coffee and tea, with or without caffeine. ? Drinks that contain alcohol. ? Energy drinks and sports drinks. ? Bubbly (carbonated) drinks or sodas. ? Chocolate and cocoa. ? Peppermint and mint flavorings. ? Garlic and onions. ? Horseradish. ? Spicy and acidic foods. These include peppers, chili powder, curry powder, vinegar, hot sauces, and BBQ sauce. ? Citrus fruit juices and citrus fruits, such as oranges, lemons, and limes. ? Tomato-based foods. These include red sauce, chili, salsa, and pizza with red sauce. ? Fried and fatty foods. These include donuts, french fries, potato chips, and high-fat dressings. ? High-fat meats. These include hot dogs, rib eye steak, sausage, ham, and bacon. ? High-fat dairy items, such as whole milk, butter, and cream cheese.  Eat small meals often. Avoid eating large meals.  Avoid drinking large amounts of liquid with your meals.  Avoid eating meals during the 2-3 hours before bedtime.  Avoid lying down right after you eat.  Do not exercise right after you eat.   Lifestyle  Do not smoke or use any products that contain nicotine or tobacco. If you need help quitting, ask your doctor.  Try to lower your stress. If you need help doing this, ask your doctor.  If you are overweight, lose an amount of weight that is healthy for you. Ask your doctor about a safe weight loss goal.   General instructions    Pay attention to any changes in your symptoms.  Take over-the-counter and prescription medicines only as told by your doctor.  Do not take aspirin, ibuprofen, or other NSAIDs unless your doctor says it is okay.  Wear loose clothes. Do not wear anything tight around your waist.  Raise (elevate) the head of your bed about 6 inches (15 cm). You may need to use a wedge to do this.  Avoid bending over if this makes your  symptoms worse.  Keep all follow-up visits. Contact a doctor if:  You have new symptoms.  You lose weight and you do not know why.  You have trouble swallowing or it hurts to swallow.  You have wheezing or a cough that keeps happening.  You have a hoarse voice.  Your symptoms do not get better with treatment. Get help right away if:  You have sudden pain in your arms, neck, jaw, teeth, or back.  You suddenly feel sweaty, dizzy, or light-headed.  You have chest pain or shortness of breath.  You vomit and the vomit is green, yellow, or black, or it looks like blood or coffee grounds.  You faint.  Your poop (stool) is red, bloody, or black.  You cannot swallow, drink, or eat. These symptoms may represent a serious problem that is an emergency. Do not wait to see if the symptoms will go away. Get medical help right away. Call your local emergency services (911 in the [place] Do not drive yourself to the hospital. Summary  If a person has gastroesophageal reflux disease (GERD), food and stomach acid move back up into the esophagus and cause symptoms or problems such as damage to the esophagus.  Treatment will depend on how bad your symptoms are.  Follow a diet as told by your doctor.  Take all medicines only as told by your doctor. This information is not intended to replace advice given to you by your health care provider. Make sure you discuss any questions you have with your health care provider. Document Revised: 12/12/2019 Document Reviewed: 12/12/2019 Elsevier Patient Education  2021 [employer]   Morning Sickness  Morning sickness is when you feel like you may vomit (feel nauseous) during pregnancy. Sometimes, you may vomit. Morning sickness most often happens in the morning, but it can also happen at any time of the day. Some women may have morning sickness that makes them vomit all the time. This is a more serious problem that needs treatment. What are the  causes? The cause of this condition is not known. What increases the risk?  You had vomiting or a feeling like you may vomit before your pregnancy.  You had morning sickness in another pregnancy.  You are pregnant with more than one baby, such as twins. What are the signs or symptoms?  Feeling like you may vomit.  Vomiting. How is this treated? Treatment is usually not needed for this condition. You may only need to change what you eat. In some cases, your doctor may give you some things to take for your condition. These include:  Vitamin B6 supplements.  Medicines to treat the feeling that you may vomit.  Ginger. Follow these instructions at home: Medicines  Take over-the-counter and prescription medicines only as told by your doctor. Do not take any medicines until you talk with your doctor about them first.  Take multivitamins before you get pregnant. These can stop or lessen the symptoms of morning sickness. Eating and drinking  Eat dry toast or crackers  before getting out of bed.  Eat 5 or 6 small meals a day.  Eat dry and bland foods like rice and baked potatoes.  Do not eat greasy, fatty, or spicy foods.  Have someone cook for you if the smell of food causes you to vomit or to feel like you may vomit.  If you feel like you may vomit after taking prenatal vitamins, take them at night or with a snack.  Eat protein foods when you need a snack. Nuts, yogurt, and cheese are good choices.  Drink fluids throughout the day.  Try ginger ale made with real ginger, ginger tea made from fresh grated ginger, or ginger candies. General instructions  Do not smoke or use any products that contain nicotine or tobacco. If you need help quitting, ask your doctor.  Use an air purifier to keep the air in your house free of smells.  Get lots of fresh air.  Try to avoid smells that make you feel sick.  Try wearing an acupressure wristband. This is a wristband that is used to  treat seasickness.  Try a treatment called acupuncture. In this treatment, a doctor puts needles into certain areas of your body to make you feel better. Contact a doctor if:  You need medicine to feel better.  You feel dizzy or light-headed.  You are losing weight. Get help right away if:  The feeling that you may vomit will not go away, or you cannot stop vomiting.  You faint.  You have very bad pain in your belly. Summary  Morning sickness is when you feel like you may vomit (feel nauseous) during pregnancy.  You may feel sick in the morning, but you can feel this way at any time of the day.  Making some changes to what you eat may help your symptoms go away. This information is not intended to replace advice given to you by your health care provider. Make sure you discuss any questions you have with your health care provider. Document Revised: 01/16/2020 Document Reviewed: 12/26/2019 Elsevier Patient Education  2021 [employer].

## 2020-09-18 NOTE — MAU Provider Note (Signed)
History     CSN: [id number]  Arrival date and time: 09/18/20 1057  Event Date/Time  First Provider Initiated Contact with Patient 09/18/20 1149     20 y.o. G1 @29 .6 wks with did twins presenting with RUQ pain. She was seen in MAU yesterday for the same. Reports onset 2 days ago. Pain is located right epigastric region. Describes as stabbing and burning. Rates pain 8/10. She has not taken anything for it. Reports N/V last night. She was able to tolerate a banana, cereal, and water today but feels nauseous. She reports nausea in the first trimester which returned in her third trimester, uses Diclegis at hs. She's also taking Protonix for GERD, last dose yesterday. Denies fevers. Had BM last night. Denies VB, LOF, ctx. Reports +FM x2.   OB History    Gravida  1   Para  0   Term  0   Preterm  0   AB  0   Living  0     SAB  0   IAB  0   Ectopic  0   Multiple  0   Live Births  0           Past Medical History:  Diagnosis Date  . Anxiety   . COVID-19   . Depression   . GERD (gastroesophageal reflux disease)   . Tachycardia    no medications-intermittent    Past Surgical History:  Procedure Laterality Date  . NO PAST SURGERIES      Family History  Problem Relation Age of Onset  . Diabetes Mellitus II Mother   . Hypertension Mother   . Hypertension Father   . Stomach cancer Maternal Aunt   . Stomach cancer Paternal Grandmother     Social History   Tobacco Use  . Smoking status: Never Smoker  . Smokeless tobacco: Never Used  Vaping Use  . Vaping Use: Never used  Substance Use Topics  . Alcohol use: Not Currently  . Drug use: Not Currently    Allergies:  Allergies  Allergen Reactions  . Dust Mite Extract Hives  . Mixed Ragweed Hives  . Venofer [Iron Sucrose] Other (See Comments)    Chest pain    Medications Prior to Admission  Medication Sig Dispense Refill Last Dose  . acetaminophen (TYLENOL) 500 MG tablet Take 500 mg by mouth every 6 (six)  hours as needed.   09/17/2020 at Unknown time  . aspirin EC 81 MG tablet Take 81 mg by mouth daily. Swallow whole.   09/17/2020 at Unknown time  . calcium carbonate (TUMS - DOSED IN MG ELEMENTAL CALCIUM) 500 MG chewable tablet Chew 1 tablet by mouth daily.   Past Week at Unknown time  . cetirizine (ZYRTEC) 10 MG tablet Take 10 mg by mouth daily.   09/17/2020 at Unknown time  . cyclobenzaprine (FLEXERIL) 10 MG tablet Take 1 tablet (10 mg total) by mouth 3 (three) times daily as needed for muscle spasms. 30 tablet 0 09/17/2020 at Unknown time  . Doxylamine-Pyridoxine (DICLEGIS) 10-10 MG TBEC Take 2 tablets by mouth at bedtime. If symptoms persist, add one tablet in the morning and one in the afternoon 100 tablet 5 09/17/2020 at Unknown time  . Ferrous Sulfate Dried (HIGH POTENCY IRON) 65 MG TABS Take by mouth.   Past Week at Unknown time  . metroNIDAZOLE (FLAGYL) 500 MG tablet Take 1 tablet (500 mg total) by mouth 2 (two) times daily for 7 days. 14 tablet 0 09/17/2020 at Unknown  time  . pantoprazole (PROTONIX) 40 MG tablet Take 1 tablet (40 mg total) by mouth daily. 30 tablet 1 09/17/2020 at Unknown time  . Prenatal Vit-Fe Fumarate-FA (PRENATAL VITAMINS PO) Take 1 tablet by mouth daily.   09/17/2020 at Unknown time  . fluticasone (FLONASE) 50 MCG/ACT nasal spray Place 1 spray into both nostrils in the morning and at bedtime. 9.9 mL 2   . phenazopyridine (PYRIDIUM) 200 MG tablet Take 1 tablet (200 mg total) by mouth 3 (three) times daily as needed for up to 3 days for pain. 10 tablet 0 Unknown at Unknown time  . sertraline (ZOLOFT) 50 MG tablet TAKE 1 TABLET BY MOUTH EVERY DAY 90 tablet 2 Unknown at Unknown time    Review of Systems  Constitutional: Negative for chills and fever.  Gastrointestinal: Positive for abdominal pain, nausea and vomiting. Negative for constipation and diarrhea.  Genitourinary: Negative for vaginal bleeding and vaginal discharge.   Physical Exam   Blood pressure 123/67, pulse (!) 107,  temperature 99.3 F (37.4 C), temperature source Oral, resp. rate 16, last menstrual period 02/22/2020, SpO2 100 %.  Physical Exam Vitals and nursing note reviewed.  Constitutional:      General: She is not in acute distress.    Appearance: Normal appearance.  HENT:     Head: Normocephalic and atraumatic.  Pulmonary:     Effort: Pulmonary effort is normal. No respiratory distress.  Abdominal:     General: There is no distension.     Palpations: Abdomen is soft. There is no mass.     Tenderness: There is abdominal tenderness (Rt) in the epigastric area. There is no guarding or rebound.     Hernia: No hernia is present.     Comments: gravid  Musculoskeletal:        General: Normal range of motion.     Cervical back: Normal range of motion.  Skin:    General: Skin is warm and dry.  Neurological:     General: No focal deficit present.     Mental Status: She is oriented to person, place, and time.  Psychiatric:        Mood and Affect: Mood normal.        Behavior: Behavior normal.   EFM-A: 145 bpm, mod variability, + accels, no decels EFM-B: 155 bpm, mod variability, + accels, no decels Toco: none  No results found for this or any previous visit (from the past 24 hour(s)).  MAU Course  Procedures GI cocktail Zofran  MDM Chart reviewed: normal workup yesterday including labs and imaging for RUQ pain, therefore not repeated today. Suspect worsening GERD and morning sickness. Feeling better after GI cocktail. Recommend change Protonix to bid and Zofran prn. Stable for discharge home.   Assessment and Plan  [redacted] week gestation Didi twins Reactive NST GERD Morning sickness Discharge home Follow up [facility] as scheduled Rx Zofran Protonix bid  Allergies as of 09/18/2020      Reactions   Dust Mite Extract Hives   Mixed Ragweed Hives   Venofer [iron Sucrose] Other (See Comments)   Chest pain      Medication List    TAKE these medications   acetaminophen 500 MG  tablet Commonly known as: TYLENOL Take 500 mg by mouth every 6 (six) hours as needed.   aspirin EC 81 MG tablet Take 81 mg by mouth daily. Swallow whole.   calcium carbonate 500 MG chewable tablet Commonly known as: TUMS - dosed in mg  elemental calcium Chew 1 tablet by mouth daily.   cetirizine 10 MG tablet Commonly known as: ZYRTEC Take 10 mg by mouth daily.   cyclobenzaprine 10 MG tablet Commonly known as: FLEXERIL Take 1 tablet (10 mg total) by mouth 3 (three) times daily as needed for muscle spasms.   Doxylamine-Pyridoxine 10-10 MG Tbec Commonly known as: Diclegis Take 2 tablets by mouth at bedtime. If symptoms persist, add one tablet in the morning and one in the afternoon   fluticasone 50 MCG/ACT nasal spray Commonly known as: Flonase Place 1 spray into both nostrils in the morning and at bedtime.   High Potency Iron 65 MG Tabs Take by mouth.   metroNIDAZOLE 500 MG tablet Commonly known as: FLAGYL Take 1 tablet (500 mg total) by mouth 2 (two) times daily for 7 days.   ondansetron 8 MG disintegrating tablet Commonly known as: ZOFRAN-ODT Take 1 tablet (8 mg total) by mouth every 8 (eight) hours as needed for nausea or vomiting.   pantoprazole 40 MG tablet Commonly known as: PROTONIX Take 1 tablet (40 mg total) by mouth daily.   phenazopyridine 200 MG tablet Commonly known as: Pyridium Take 1 tablet (200 mg total) by mouth 3 (three) times daily as needed for up to 3 days for pain.   PRENATAL VITAMINS PO Take 1 tablet by mouth daily.   sertraline 50 MG tablet Commonly known as: ZOLOFT TAKE 1 TABLET BY MOUTH EVERY DAY       Donette Larry, CNM 09/18/2020, 12:04 PM

## 2020-09-18 NOTE — MAU Note (Signed)
Alexis [name] is a 20 y.o. at [redacted]w[redacted]d here in MAU reporting: right upper abdominal pain that is worse since yesterday. States she had some vomiting from the pain last night but was able to keep a banana down this AM. No bleeding, LOF, or discharge. +FM  Onset of complaint: ongoing  Pain score: 8/10  Vitals:   09/18/20 1111  BP: 123/67  Pulse: (!) 107  Resp: 16  Temp: 99.3 F (37.4 C)  SpO2: 100%     FHT: Baby A: 152 Baby B: 132  Lab orders placed from triage: UA

## 2020-09-19 ENCOUNTER — Encounter: Payer: Medicaid Other | Admitting: Family Medicine

## 2020-09-19 ENCOUNTER — Ambulatory Visit (INDEPENDENT_AMBULATORY_CARE_PROVIDER_SITE_OTHER): Payer: Medicaid Other | Admitting: Family Medicine

## 2020-09-19 VITALS — BP 116/67 | HR 100 | Wt 177.0 lb

## 2020-09-19 DIAGNOSIS — M9902 Segmental and somatic dysfunction of thoracic region: Secondary | ICD-10-CM | POA: Diagnosis not present

## 2020-09-19 DIAGNOSIS — I10 Essential (primary) hypertension: Secondary | ICD-10-CM

## 2020-09-19 DIAGNOSIS — O30041 Twin pregnancy, dichorionic/diamniotic, first trimester: Secondary | ICD-10-CM

## 2020-09-19 DIAGNOSIS — R002 Palpitations: Secondary | ICD-10-CM | POA: Diagnosis not present

## 2020-09-19 DIAGNOSIS — M546 Pain in thoracic spine: Secondary | ICD-10-CM

## 2020-09-19 DIAGNOSIS — O0991 Supervision of high risk pregnancy, unspecified, first trimester: Secondary | ICD-10-CM

## 2020-09-19 DIAGNOSIS — R1011 Right upper quadrant pain: Secondary | ICD-10-CM

## 2020-09-19 DIAGNOSIS — M9908 Segmental and somatic dysfunction of rib cage: Secondary | ICD-10-CM

## 2020-09-19 DIAGNOSIS — Z3A30 30 weeks gestation of pregnancy: Secondary | ICD-10-CM

## 2020-09-19 DIAGNOSIS — R0602 Shortness of breath: Secondary | ICD-10-CM | POA: Diagnosis not present

## 2020-09-19 MED ORDER — FAMOTIDINE 20 MG PO TABS: 20.0000 mg | ORAL_TABLET | Freq: Two times a day (BID) | ORAL | 3 refills | Status: DC

## 2020-09-19 NOTE — Progress Notes (Signed)
   PRENATAL VISIT NOTE  Subjective:  Alexis [name] is a 20 y.o. G1P0000 at [redacted]w[redacted]d being seen today for ongoing prenatal care.  She is currently monitored for the following issues for this high-risk pregnancy and has GERD (gastroesophageal reflux disease); Dichorionic diamniotic twin pregnancy in first trimester; High-risk pregnancy, first trimester; and Chronic hypertension on their problem list.  Patient reports backache in the midthoracic area. More on the right. No radiation. Still having abdominal pain, which is better for 30-40 minutes after eating, then returns.  Contractions: Not present. Vag. Bleeding: None.  Movement: Present. Denies leaking of fluid.   The following portions of the patient's history were reviewed and updated as appropriate: allergies, current medications, past family history, past medical history, past social history, past surgical history and problem list. Problem list updated.  Objective:   Vitals:   09/19/20 1309  BP: 116/67  Pulse: 100  Weight: 177 lb (80.3 kg)    Fetal Status: Fetal Heart Rate (bpm): 138/149   Movement: Present     General:  Alert, oriented and cooperative. Patient is in no acute distress.  Skin: Skin is warm and dry. No rash noted.   Cardiovascular: Normal heart rate noted  Respiratory: Normal respiratory effort, no problems with respiration noted  Abdomen: Soft, gravid, appropriate for gestational age. Pain/Pressure: Present     Pelvic:  Cervical exam deferred        MSK: Restriction, tenderness, tissue texture changes, and paraspinal spasm in the right thoracic spine  Neuro: Moves all four extremities with no focal neurological deficit  Extremities: Normal range of motion.  Edema: None  Mental Status: Normal mood and affect. Normal behavior. Normal judgment and thought content.   OSE: Head   Cervical   Thoracic T8 FSRR, T4 FSRL  Rib 8th rib inhaled  Lumbar   Sacrum   Pelvis     Assessment and Plan:  Pregnancy: G1P0000  at [redacted]w[redacted]d  1. [redacted] weeks gestation of pregnancy  2. High-risk pregnancy, first trimester FHT and FH normal  3. Dichorionic diamniotic twin pregnancy in first trimester Continue with serial [place].  4. Chronic hypertension BP controlled  5. RUQ pain Continue protonix, add pepcid. Need to r/o PUD with H pylori. - H. pylori antigen, stool  6. Acute right-sided thoracic back pain 7. Somatic dysfunction of spine, thoracic 8. Somatic dysfunction of rib OMT done after patient permission. HVLA technique utilized. 2 areas treated with improvement of tissue texture and joint mobility. Patient tolerated procedure well.    Preterm labor symptoms and general obstetric precautions including but not limited to vaginal bleeding, contractions, leaking of fluid and fetal movement were reviewed in detail with the patient. Please refer to After Visit Summary for other counseling recommendations.  Return in about 2 weeks (around 10/03/2020).  [name], DO

## 2020-09-23 ENCOUNTER — Encounter (HOSPITAL_COMMUNITY): Payer: Self-pay | Admitting: Family Medicine

## 2020-09-23 ENCOUNTER — Inpatient Hospital Stay (HOSPITAL_COMMUNITY)
Admission: AD | Admit: 2020-09-23 | Discharge: 2020-09-23 | Disposition: A | Discharge status: Home / Self Care | Payer: Medicaid Other | Attending: Obstetrics and Gynecology | Admitting: Obstetrics and Gynecology

## 2020-09-23 ENCOUNTER — Other Ambulatory Visit: Payer: Self-pay

## 2020-09-23 ENCOUNTER — Inpatient Hospital Stay (HOSPITAL_COMMUNITY): Payer: Medicaid Other

## 2020-09-23 DIAGNOSIS — R1011 Right upper quadrant pain: Secondary | ICD-10-CM | POA: Diagnosis not present

## 2020-09-23 DIAGNOSIS — O99613 Diseases of the digestive system complicating pregnancy, third trimester: Secondary | ICD-10-CM | POA: Insufficient documentation

## 2020-09-23 DIAGNOSIS — Z3A30 30 weeks gestation of pregnancy: Secondary | ICD-10-CM | POA: Insufficient documentation

## 2020-09-23 DIAGNOSIS — Z7982 Long term (current) use of aspirin: Secondary | ICD-10-CM | POA: Insufficient documentation

## 2020-09-23 DIAGNOSIS — O212 Late vomiting of pregnancy: Secondary | ICD-10-CM | POA: Diagnosis present

## 2020-09-23 DIAGNOSIS — O26893 Other specified pregnancy related conditions, third trimester: Secondary | ICD-10-CM | POA: Diagnosis not present

## 2020-09-23 DIAGNOSIS — Z8616 Personal history of COVID-19: Secondary | ICD-10-CM | POA: Diagnosis not present

## 2020-09-23 DIAGNOSIS — K219 Gastro-esophageal reflux disease without esophagitis: Secondary | ICD-10-CM | POA: Diagnosis not present

## 2020-09-23 DIAGNOSIS — Z79899 Other long term (current) drug therapy: Secondary | ICD-10-CM | POA: Diagnosis not present

## 2020-09-23 DIAGNOSIS — R109 Unspecified abdominal pain: Secondary | ICD-10-CM | POA: Diagnosis present

## 2020-09-23 LAB — COMPREHENSIVE METABOLIC PANEL
ALT: 14 U/L (ref 0–44)
AST: 18 U/L (ref 15–41)
Albumin: 2.9 g/dL — ABNORMAL LOW (ref 3.5–5.0)
Alkaline Phosphatase: 76 U/L (ref 38–126)
Anion gap: 6 (ref 5–15)
BUN: 6 mg/dL (ref 6–20)
CO2: 19 mmol/L — ABNORMAL LOW (ref 22–32)
Calcium: 8.5 mg/dL — ABNORMAL LOW (ref 8.9–10.3)
Chloride: 111 mmol/L (ref 98–111)
Creatinine, Ser: 0.53 mg/dL (ref 0.44–1.00)
GFR, Estimated: >60 mL/min (ref >60)
Glucose, Bld: 77 mg/dL (ref 70–99)
Potassium: 3.7 mmol/L (ref 3.5–5.1)
Sodium: 136 mmol/L (ref 135–145)
Total Bilirubin: 0.4 mg/dL (ref 0.3–1.2)
Total Protein: 5.9 g/dL — ABNORMAL LOW (ref 6.5–8.1)

## 2020-09-23 LAB — URINALYSIS, ROUTINE W REFLEX MICROSCOPIC
Bilirubin Urine: NEGATIVE
Glucose, UA: 50 mg/dL — AB
Hgb urine dipstick: NEGATIVE
Ketones, ur: NEGATIVE mg/dL
Leukocytes,Ua: NEGATIVE
Nitrite: NEGATIVE
Protein, ur: NEGATIVE mg/dL
Specific Gravity, Urine: 1.012 (ref 1.005–1.030)
pH: 6 (ref 5.0–8.0)

## 2020-09-23 LAB — CBC
HCT: 30.5 % — ABNORMAL LOW (ref 36.0–46.0)
Hemoglobin: 9.9 g/dL — ABNORMAL LOW (ref 12.0–15.0)
MCH: 32.5 pg (ref 26.0–34.0)
MCHC: 32.5 g/dL (ref 30.0–36.0)
MCV: 100 fL (ref 80.0–100.0)
Platelets: 189 10*3/uL (ref 150–400)
RBC: 3.05 MIL/uL — ABNORMAL LOW (ref 3.87–5.11)
RDW: 15.3 % (ref 11.5–15.5)
WBC: 7.4 10*3/uL (ref 4.0–10.5)
nRBC: 0 % (ref 0.0–0.2)

## 2020-09-23 LAB — LIPASE, BLOOD: Lipase: 25 U/L (ref 11–51)

## 2020-09-23 LAB — PROTEIN / CREATININE RATIO, URINE
Creatinine, Urine: 96.11 mg/dL
Protein Creatinine Ratio: 0.09 mg/mg{Cre} (ref 0.00–0.15)
Total Protein, Urine: 9 mg/dL

## 2020-09-23 LAB — H. PYLORI ANTIGEN, STOOL: H pylori Ag, Stl: NEGATIVE

## 2020-09-23 LAB — AMYLASE: Amylase: 69 U/L (ref 28–100)

## 2020-09-23 MED ORDER — LIDOCAINE VISCOUS HCL 2 % MT SOLN
15.0000 mL | Freq: Once | OROMUCOSAL | Status: AC
  Administered 2020-09-23: 15 mL via ORAL
  Filled 2020-09-23: qty 15

## 2020-09-23 MED ORDER — ALUM & MAG HYDROXIDE-SIMETH 200-200-20 MG/5ML PO SUSP
30.0000 mL | Freq: Once | ORAL | Status: AC
  Administered 2020-09-23: 30 mL via ORAL
  Filled 2020-09-23: qty 30

## 2020-09-23 NOTE — MAU Provider Note (Signed)
Chief Complaint:  Abdominal Pain, Headache, and Emesis  HPI: Alexis [name] is a 20 y.o. G1P0000 at 2630w4d who presents to maternity admissions with ongoing RUQ pain and vomiting. Patient reports persistent RUQ pain x1 week, now radiating into right shoulder. She continues to have nausea, reporting 2 episodes of vomiting this morning. She takes Diclegis, Protonix, and Pepcid, all of which she took last night. She has been able to tolerate some PO fluids. She denies fever or diarrhea. Pt also endorses intermittent HA, which is relieved with Tylenol. Denies HA currently. Denies contractions, leakage of fluid or vaginal bleeding. Good fetal movement.   Pregnancy Course:   Past Medical History:  Diagnosis Date  . Anxiety   . COVID-19   . Depression   . GERD (gastroesophageal reflux disease)   . Tachycardia    no medications-intermittent   OB History  Gravida Para Term Preterm AB Living  1 0 0 0 0 0  SAB IAB Ectopic Multiple Live Births  0 0 0 0 0    # Outcome Date GA Lbr Len/2nd Weight Sex Delivery Anes PTL Lv  1 Current            Past Surgical History:  Procedure Laterality Date  . NO PAST SURGERIES     Family History  Problem Relation Age of Onset  . Diabetes Mellitus II Mother   . Hypertension Mother   . Hypertension Father   . Stomach cancer Maternal Aunt   . Stomach cancer Paternal Grandmother    Social History   Tobacco Use  . Smoking status: Never Smoker  . Smokeless tobacco: Never Used  Vaping Use  . Vaping Use: Never used  Substance Use Topics  . Alcohol use: Not Currently  . Drug use: Not Currently   Allergies  Allergen Reactions  . Dust Mite Extract Hives  . Mixed Ragweed Hives  . Venofer [Iron Sucrose] Other (See Comments)    Chest pain   Medications Prior to Admission  Medication Sig Dispense Refill Last Dose  . aspirin EC 81 MG tablet Take 81 mg by mouth daily. Swallow whole.   09/22/2020 at Unknown time  . cetirizine (ZYRTEC) 10 MG tablet Take 10  mg by mouth daily.   09/22/2020 at Unknown time  . Doxylamine-Pyridoxine (DICLEGIS) 10-10 MG TBEC Take 2 tablets by mouth at bedtime. If symptoms persist, add one tablet in the morning and one in the afternoon 100 tablet 5 09/22/2020 at Unknown time  . famotidine (PEPCID) 20 MG tablet Take 1 tablet (20 mg total) by mouth 2 (two) times daily. 60 tablet 3 09/22/2020 at Unknown time  . Ferrous Sulfate Dried (HIGH POTENCY IRON) 65 MG TABS Take by mouth.   Past Week at Unknown time  . pantoprazole (PROTONIX) 40 MG tablet Take 1 tablet (40 mg total) by mouth daily. 30 tablet 1 09/22/2020 at Unknown time  . Prenatal Vit-Fe Fumarate-FA (PRENATAL VITAMINS PO) Take 1 tablet by mouth daily.   09/22/2020 at Unknown time  . pseudoephedrine-acetaminophen (TYLENOL SINUS) 30-500 MG TABS tablet Take 1 tablet by mouth every 4 (four) hours as needed.     Marland Kitchen. acetaminophen (TYLENOL) 500 MG tablet Take 500 mg by mouth every 6 (six) hours as needed.     . calcium carbonate (TUMS - DOSED IN MG ELEMENTAL CALCIUM) 500 MG chewable tablet Chew 1 tablet by mouth daily.     . cyclobenzaprine (FLEXERIL) 10 MG tablet Take 1 tablet (10 mg total) by mouth 3 (three) times daily  as needed for muscle spasms. 30 tablet 0 Unknown at Unknown time  . fluticasone (FLONASE) 50 MCG/ACT nasal spray Place 1 spray into both nostrils in the morning and at bedtime. 9.9 mL 2   . metroNIDAZOLE (FLAGYL) 500 MG tablet Take 1 tablet (500 mg total) by mouth 2 (two) times daily for 7 days. 14 tablet 0   . ondansetron (ZOFRAN-ODT) 8 MG disintegrating tablet Take 1 tablet (8 mg total) by mouth every 8 (eight) hours as needed for nausea or vomiting. (Patient not taking: Reported on 09/19/2020) 20 tablet 0   . sertraline (ZOLOFT) 50 MG tablet TAKE 1 TABLET BY MOUTH EVERY DAY (Patient not taking: Reported on 09/19/2020) 90 tablet 2    I have reviewed patient's Past Medical Hx, Surgical Hx, Family Hx, Social Hx, medications and allergies.   ROS:  Review of Systems   Constitutional: Negative.   HENT: Negative.   Respiratory: Negative.   Cardiovascular: Negative.   Gastrointestinal: Positive for nausea and vomiting. Negative for diarrhea.       RUQ pain  Genitourinary: Negative.   Neurological: Negative.   Psychiatric/Behavioral: Negative.     Physical Exam   Patient Vitals for the past 24 hrs:  BP Pulse SpO2  09/23/20 0930 114/73 (!) 108 97 %   Constitutional: well-developed, well-nourished female in no acute distress.  Cardiovascular: normal rate Respiratory: normal effort GI: abd soft, RUQ and epigastric tenderness, gravid MS: Extremities nontender, no edema, normal ROM Neurologic: Alert and oriented x 4.  GU: Neg CVAT. Pelvic: deferred   FHT: Baby A: Baseline 135 , moderate variability, accelerations present, no decelerations Baby B: Baseline 140, moderate variability, accelerations present, no decelerations Contractions: none   Labs: Results for orders placed or performed during the hospital encounter of 09/23/20 (from the past 24 hour(s))  Urinalysis, Routine w reflex microscopic Urine, Clean Catch     Status: Abnormal   Collection Time: 09/23/20 10:33 AM  Result Value Ref Range   Color, Urine YELLOW YELLOW   APPearance CLEAR CLEAR   Specific Gravity, Urine 1.012 1.005 - 1.030   pH 6.0 5.0 - 8.0   Glucose, UA 50 (A) NEGATIVE mg/dL   Hgb urine dipstick NEGATIVE NEGATIVE   Bilirubin Urine NEGATIVE NEGATIVE   Ketones, ur NEGATIVE NEGATIVE mg/dL   Protein, ur NEGATIVE NEGATIVE mg/dL   Nitrite NEGATIVE NEGATIVE   Leukocytes,Ua NEGATIVE NEGATIVE  Protein / creatinine ratio, urine     Status: None   Collection Time: 09/23/20 10:33 AM  Result Value Ref Range   Creatinine, Urine 96.11 mg/dL   Total Protein, Urine 9 mg/dL   Protein Creatinine Ratio 0.09 0.00 - 0.15 mg/mg[Cre]  CBC     Status: Abnormal   Collection Time: 09/23/20 10:40 AM  Result Value Ref Range   WBC 7.4 4.0 - 10.5 K/uL   RBC 3.05 (L) 3.87 - 5.11 MIL/uL    Hemoglobin 9.9 (L) 12.0 - 15.0 g/dL   HCT 09.6 (L) 28.3 - 66.2 %   MCV 100.0 80.0 - 100.0 fL   MCH 32.5 26.0 - 34.0 pg   MCHC 32.5 30.0 - 36.0 g/dL   RDW 94.7 65.4 - 65.0 %   Platelets 189 150 - 400 K/uL   nRBC 0.0 0.0 - 0.2 %  Comprehensive metabolic panel     Status: Abnormal   Collection Time: 09/23/20 10:40 AM  Result Value Ref Range   Sodium 136 135 - 145 mmol/L   Potassium 3.7 3.5 - 5.1 mmol/L  Chloride 111 98 - 111 mmol/L   CO2 19 (L) 22 - 32 mmol/L   Glucose, Bld 77 70 - 99 mg/dL   BUN 6 6 - 20 mg/dL   Creatinine, Ser 9.56 0.44 - 1.00 mg/dL   Calcium 8.5 (L) 8.9 - 10.3 mg/dL   Total Protein 5.9 (L) 6.5 - 8.1 g/dL   Albumin 2.9 (L) 3.5 - 5.0 g/dL   AST 18 15 - 41 U/L   ALT 14 0 - 44 U/L   Alkaline Phosphatase 76 38 - 126 U/L   Total Bilirubin 0.4 0.3 - 1.2 mg/dL   GFR, Estimated >38 >75 mL/min   Anion gap 6 5 - 15  Amylase     Status: None   Collection Time: 09/23/20 10:40 AM  Result Value Ref Range   Amylase 69 28 - 100 U/L  Lipase, blood     Status: None   Collection Time: 09/23/20 10:40 AM  Result Value Ref Range   Lipase 25 11 - 51 U/L    Imaging:  US Abdomen Complete  Result Date: 09/23/2020 CLINICAL DATA:  Abdominal pain EXAM: ABDOMEN ULTRASOUND COMPLETE COMPARISON:  Ultrasound right upper quadrant September 17, 2020 FINDINGS: Gallbladder: No gallstones or wall thickening visualized. There is no pericholecystic fluid. No sonographic Murphy sign noted by sonographer. Common bile duct: Diameter: 5 mm. No intrahepatic, common hepatic, or common bile duct dilatation. Liver: No focal lesion identified. Within normal limits in parenchymal echogenicity. Portal vein is patent on color Doppler imaging with normal direction of blood flow towards the liver. IVC: No abnormality visualized. Pancreas: No pancreatic mass or inflammatory focus. Spleen: Size and appearance within normal limits. Right Kidney: Length: 10.0 cm. Echogenicity within normal limits. No mass or hydronephrosis  visualized. Left Kidney: Length: 11.3 cm. Echogenicity within normal limits. No mass or hydronephrosis visualized. Abdominal aorta: No aneurysm visualized. Other findings: No evident ascites. IMPRESSION: Study within normal limits. Electronically Signed   By: Bretta Bang III M.D.   On: 09/23/2020 12:22    MDM: UA CBC CMP Amylase Lipase Protein/creatinine ratio US Abdomen Complete GI cocktail  Labs and [place] today wnl. Pain improvement after GI cocktail. Suspect worsening GERD. Recommend continue Protonix, Pepcid, and Diclegis as prescribed. Recommend keeping food diary to track possible triggers. Stable for discharge home with strict return precautions. Keep prenatal appointment as scheduled.   Assessment: 1. Gastroesophageal reflux disease, unspecified whether esophagitis present     Plan: Discharge home in stable condition. Follow up as scheduled for prenatal appointment.   Labor precautions and fetal kick counts   Allergies as of 09/23/2020      Reactions   Dust Mite Extract Hives   Mixed Ragweed Hives   Venofer [iron Sucrose] Other (See Comments)   Chest pain      Medication List    TAKE these medications   acetaminophen 500 MG tablet Commonly known as: TYLENOL Take 500 mg by mouth every 6 (six) hours as needed.   aspirin EC 81 MG tablet Take 81 mg by mouth daily. Swallow whole.   calcium carbonate 500 MG chewable tablet Commonly known as: TUMS - dosed in mg elemental calcium Chew 1 tablet by mouth daily.   cetirizine 10 MG tablet Commonly known as: ZYRTEC Take 10 mg by mouth daily.   cyclobenzaprine 10 MG tablet Commonly known as: FLEXERIL Take 1 tablet (10 mg total) by mouth 3 (three) times daily as needed for muscle spasms.   Doxylamine-Pyridoxine 10-10 MG Tbec Commonly known as:  Diclegis Take 2 tablets by mouth at bedtime. If symptoms persist, add one tablet in the morning and one in the afternoon   famotidine 20 MG tablet Commonly known as:  PEPCID Take 1 tablet (20 mg total) by mouth 2 (two) times daily.   fluticasone 50 MCG/ACT nasal spray Commonly known as: Flonase Place 1 spray into both nostrils in the morning and at bedtime.   High Potency Iron 65 MG Tabs Take by mouth.   metroNIDAZOLE 500 MG tablet Commonly known as: FLAGYL Take 1 tablet (500 mg total) by mouth 2 (two) times daily for 7 days.   ondansetron 8 MG disintegrating tablet Commonly known as: ZOFRAN-ODT Take 1 tablet (8 mg total) by mouth every 8 (eight) hours as needed for nausea or vomiting.   pantoprazole 40 MG tablet Commonly known as: PROTONIX Take 1 tablet (40 mg total) by mouth daily.   PRENATAL VITAMINS PO Take 1 tablet by mouth daily.   pseudoephedrine-acetaminophen 30-500 MG Tabs tablet Commonly known as: TYLENOL SINUS Take 1 tablet by mouth every 4 (four) hours as needed.   sertraline 50 MG tablet Commonly known as: ZOLOFT TAKE 1 TABLET BY MOUTH EVERY DAY       Camelia Eng, CNM 09/23/2020 10:19 AM

## 2020-09-23 NOTE — Discharge Instructions (Signed)
Heartburn During Pregnancy Heartburn is a type of pain or discomfort that can happen in the throat or chest. It is often described as a burning sensation. Heartburn is common during pregnancy because:  Progesterone, a hormone that is released during pregnancy, may relax the valve that separates the esophagus from the stomach (lower esophageal sphincter, or LES). This allows stomach acid to move up into the esophagus, causing heartburn.  The uterus gets larger and pushes up on the stomach, which pushes more acid into the esophagus. This is especially true in the later stages of pregnancy. Heartburn usually goes away or gets better after giving birth. What are the causes? This condition is caused by stomach acid backing up into the esophagus (reflux). Reflux can be triggered by:  Changing hormone levels.  Large meals.  Certain foods and beverages, such as coffee, chocolate, onions, and peppermint.  Exercise.  Increased stomach acid production. What increases the risk? You are more likely to develop this condition if:  You had heartburn prior to becoming pregnant.  You have been pregnant more than once before.  You are overweight or obese. The likelihood that you will get heartburn also increases as you get further along in your pregnancy, especially during the last trimester. What are the signs or symptoms? Symptoms of this condition include:  Burning pain in the chest or lower throat.  A bitter taste in the mouth.  Coughing.  Problems swallowing.  Vomiting.  A hoarse voice.  Asthma. Symptoms may get worse when you lie down or bend over. Symptoms are often worse at night. How is this diagnosed? This condition is diagnosed based on:  Your medical history.  Your symptoms.  Blood tests to check for a certain type of bacteria that is associated with heartburn.  Whether taking heartburn medicine relieves your symptoms.  An examination of the stomach and esophagus  using a tube that has a light and camera (endoscopy). How is this treated? Treatment for this condition depends on how severe your symptoms are. Your health care provider may recommend:  Over-the-counter medicines for mild heartburn, such as antacids or acid reducers.  Prescription medicines to decrease stomach acid or to protect your stomach lining.  Certain changes in your diet.  Raising the head of your bed so it is higher than the foot of the bed. This helps prevent stomach acid from backing up into the esophagus when you are lying down. Follow these instructions at home: Eating and drinking  Do not drink alcohol during your pregnancy.  Identify foods and beverages that make your symptoms worse and avoid them.  Eat small, frequent meals instead of large meals.  Avoid drinking large amounts of liquid with your meals.  Avoid eating meals during the 2-3 hours before bedtime.  Avoid lying down right after you eat.  Do not exercise right after you eat. Beverages to avoid  Coffee and tea, with or without caffeine.  Energy drinks and sports drinks.  Carbonated drinks or sodas.  Citrus fruit juices. Foods to avoid  Chocolate and cocoa.  Peppermint and mint flavorings.  Garlic, onions, and horseradish.  Spicy and acidic foods, including peppers, chili powder, curry powder, vinegar, hot sauces, and barbecue sauce.  Citrus fruits, such as oranges, lemons, and limes.  Tomato-based foods, such as red sauce, chili, and salsa.  Fried and fatty foods, such as donuts, french fries, potato chips, and high-fat dressings.  High-fat meats, such as hot dogs, precooked or cured meat, sausage, ham, and bacon.    High-fat dairy items, such as whole milk, butter, and cheese. Medicines  Take over-the-counter and prescription medicines only as told by your health care provider.  Do not take aspirin or NSAIDs, such as ibuprofen, unless your health care provider tells you to take  them.  You may be instructed to avoid medicines that contain sodium bicarbonate. General instructions  If directed, raise the head of your bed about 6 inches (15 cm) by putting blocks under the legs. Sleeping with more pillows does not effectively relieve heartburn because it only changes the position of your head.  Do not use any products that contain nicotine or tobacco, such as cigarettes, e-cigarettes, and chewing tobacco. If you need help quitting, ask your health care provider.  Wear loose-fitting clothing.  Try to reduce your stress, such as with yoga or meditation. If you need help managing stress, ask your health care provider.  Maintain a healthy weight. If you are overweight, work with your health care provider to safely manage your weight.  Keep all follow-up visits as told by your health care provider. This is important. Where to find more information  American Pregnancy Association: americanpregnancy.org Contact a health care provider if:  Your symptoms do not improve with treatment, or you develop new symptoms.  You have unexplained weight loss.  You have difficulty swallowing.  You make loud sounds when you breathe (wheeze).  You have a cough that does not go away.  You have frequent heartburn for more than 2 weeks.  You have nausea or vomiting that does not get better with treatment.  You have pain in your abdomen. Get help right away if:  You have severe chest pain that spreads to your arm, neck, or jaw.  You feel sweaty, dizzy, or light-headed.  You have shortness of breath.  You have pain when swallowing.  You vomit, and your vomit looks like blood or coffee grounds.  Your stool is bloody or black. Summary  Heartburn in pregnancy is common, especially during the last trimester.  This condition is caused by stomach acid backing up into the esophagus (reflux).  This condition can be treated with medicines, changes to your diet, or elevating the  head of your bed.  Keep all follow-up visits as told by your health care provider. This is important. This information is not intended to replace advice given to you by your health care provider. Make sure you discuss any questions you have with your health care provider. Document Revised: 02/23/2019 Document Reviewed: 02/23/2019 Elsevier Patient Education  [place].

## 2020-09-23 NOTE — MAU Note (Addendum)
Pt reports to mau with c/o ongoing uper right abd pain for the past 2 weeks, pt also reports pain in right shoulder blade for the past week.  Pt also complains of headache and dizzy spells. Pt also reports emesis x2 today   Denies LOF or ctx.

## 2020-09-26 ENCOUNTER — Ambulatory Visit (INDEPENDENT_AMBULATORY_CARE_PROVIDER_SITE_OTHER): Payer: Medicaid Other | Admitting: Family Medicine

## 2020-09-26 ENCOUNTER — Other Ambulatory Visit: Payer: Self-pay

## 2020-09-26 VITALS — BP 123/74 | HR 114 | Wt 181.0 lb

## 2020-09-26 DIAGNOSIS — I10 Essential (primary) hypertension: Secondary | ICD-10-CM

## 2020-09-26 DIAGNOSIS — R1011 Right upper quadrant pain: Secondary | ICD-10-CM

## 2020-09-26 DIAGNOSIS — Z3A31 31 weeks gestation of pregnancy: Secondary | ICD-10-CM

## 2020-09-26 DIAGNOSIS — O30041 Twin pregnancy, dichorionic/diamniotic, first trimester: Secondary | ICD-10-CM

## 2020-09-26 DIAGNOSIS — O0991 Supervision of high risk pregnancy, unspecified, first trimester: Secondary | ICD-10-CM

## 2020-09-26 NOTE — Progress Notes (Signed)
   PRENATAL VISIT NOTE  Subjective:  Franchesca Veneziano is a 20 y.o. G1P0000 at [redacted]w[redacted]d being seen today for ongoing prenatal care.  She is currently monitored for the following issues for this high-risk pregnancy and has GERD (gastroesophageal reflux disease); Dichorionic diamniotic twin pregnancy in first trimester; High-risk pregnancy, first trimester; and Chronic hypertension on their problem list.  Patient reports no complaints.  Contractions: Not present. Vag. Bleeding: None.  Movement: Present. Denies leaking of fluid.   The following portions of the patient's history were reviewed and updated as appropriate: allergies, current medications, past family history, past medical history, past social history, past surgical history and problem list.   Objective:   Vitals:   09/26/20 1004  BP: 123/74  Pulse: (!) 114  Weight: 181 lb (82.1 kg)    Fetal Status:     Movement: Present     General:  Alert, oriented and cooperative. Patient is in no acute distress.  Skin: Skin is warm and dry. No rash noted.   Cardiovascular: Normal heart rate noted  Respiratory: Normal respiratory effort, no problems with respiration noted  Abdomen: Soft, gravid, appropriate for gestational age.  Pain/Pressure: Present     Pelvic: Cervical exam deferred        Extremities: Normal range of motion.  Edema: None  Mental Status: Normal mood and affect. Normal behavior. Normal judgment and thought content.   Assessment and Plan:  Pregnancy: G1P0000 at [redacted]w[redacted]d 1. [redacted] weeks gestation of pregnancy  2. Chronic hypertension BP controlled Continue ASA.  3. High-risk pregnancy, first trimester  4. Dichorionic diamniotic twin pregnancy in first trimester Last [place] 3/23 EFW 49%/54%  5. RUQ pain  Resolved. H pylori neg  Preterm labor symptoms and general obstetric precautions including but not limited to vaginal bleeding, contractions, leaking of fluid and fetal movement were reviewed in detail with the  patient. Please refer to After Visit Summary for other counseling recommendations.   No follow-ups on file.  Future Appointments  Date Time Provider Department Center  10/03/2020  1:45 PM [facility] NURSE [facility]  10/03/2020  2:00 PM [facility] US1 [facility]  10/10/2020  9:30 AM [facility] NURSE [facility]  10/10/2020  9:45 AM [facility] US5 [facility]  10/11/2020 10:30 AM [name], DO [facility] None  10/23/2020  8:35 AM [facility] ECHO 1 [id number]  10/24/2020  9:15 AM [name], DO [facility] None  10/31/2020  8:45 AM [name], DO [facility] None  11/08/2020  8:45 AM Willodean Rosenthal, MD [facility] None  11/16/2020  8:40 AM Little Ishikawa, MD [facility]    [name], DO

## 2020-10-01 ENCOUNTER — Inpatient Hospital Stay (HOSPITAL_COMMUNITY)
Admission: AD | Admit: 2020-10-01 | Discharge: 2020-10-01 | Disposition: A | Discharge status: Home / Self Care | Payer: Medicaid Other | Attending: Family Medicine | Admitting: Family Medicine

## 2020-10-01 ENCOUNTER — Encounter (HOSPITAL_COMMUNITY): Payer: Self-pay | Admitting: Family Medicine

## 2020-10-01 ENCOUNTER — Other Ambulatory Visit: Payer: Self-pay

## 2020-10-01 ENCOUNTER — Telehealth: Payer: Self-pay

## 2020-10-01 DIAGNOSIS — Z7982 Long term (current) use of aspirin: Secondary | ICD-10-CM | POA: Insufficient documentation

## 2020-10-01 DIAGNOSIS — O99891 Other specified diseases and conditions complicating pregnancy: Secondary | ICD-10-CM | POA: Diagnosis not present

## 2020-10-01 DIAGNOSIS — M7989 Other specified soft tissue disorders: Secondary | ICD-10-CM | POA: Insufficient documentation

## 2020-10-01 DIAGNOSIS — O26893 Other specified pregnancy related conditions, third trimester: Secondary | ICD-10-CM | POA: Insufficient documentation

## 2020-10-01 DIAGNOSIS — O10913 Unspecified pre-existing hypertension complicating pregnancy, third trimester: Secondary | ICD-10-CM | POA: Insufficient documentation

## 2020-10-01 DIAGNOSIS — R519 Headache, unspecified: Secondary | ICD-10-CM

## 2020-10-01 DIAGNOSIS — O30043 Twin pregnancy, dichorionic/diamniotic, third trimester: Secondary | ICD-10-CM | POA: Diagnosis not present

## 2020-10-01 DIAGNOSIS — Z3A31 31 weeks gestation of pregnancy: Secondary | ICD-10-CM

## 2020-10-01 DIAGNOSIS — Z8616 Personal history of COVID-19: Secondary | ICD-10-CM | POA: Insufficient documentation

## 2020-10-01 DIAGNOSIS — Z8249 Family history of ischemic heart disease and other diseases of the circulatory system: Secondary | ICD-10-CM | POA: Diagnosis not present

## 2020-10-01 DIAGNOSIS — Z79899 Other long term (current) drug therapy: Secondary | ICD-10-CM | POA: Insufficient documentation

## 2020-10-01 DIAGNOSIS — O99343 Other mental disorders complicating pregnancy, third trimester: Secondary | ICD-10-CM | POA: Diagnosis not present

## 2020-10-01 DIAGNOSIS — O30041 Twin pregnancy, dichorionic/diamniotic, first trimester: Secondary | ICD-10-CM

## 2020-10-01 LAB — URINALYSIS, ROUTINE W REFLEX MICROSCOPIC
Bilirubin Urine: NEGATIVE
Glucose, UA: NEGATIVE mg/dL
Hgb urine dipstick: NEGATIVE
Ketones, ur: NEGATIVE mg/dL
Leukocytes,Ua: NEGATIVE
Nitrite: NEGATIVE
Protein, ur: NEGATIVE mg/dL
Specific Gravity, Urine: 1.01 (ref 1.005–1.030)
pH: 7 (ref 5.0–8.0)

## 2020-10-01 MED ORDER — METOCLOPRAMIDE HCL 10 MG PO TABS: 10.0000 mg | ORAL_TABLET | Freq: Three times a day (TID) | ORAL | 0 refills | Status: DC | PRN

## 2020-10-01 MED ORDER — METOCLOPRAMIDE HCL 10 MG PO TABS
10.0000 mg | ORAL_TABLET | Freq: Once | ORAL | Status: AC
  Administered 2020-10-01: 10 mg via ORAL
  Filled 2020-10-01: qty 1

## 2020-10-01 MED ORDER — ACETAMINOPHEN 500 MG PO TABS
1000.0000 mg | ORAL_TABLET | Freq: Once | ORAL | Status: AC
  Administered 2020-10-01: 1000 mg via ORAL
  Filled 2020-10-01: qty 2

## 2020-10-01 NOTE — MAU Provider Note (Signed)
History     CSN: [id number]  Arrival date and time: 10/01/20 7371   Event Date/Time   First Provider Initiated Contact with Patient 10/01/20 1029      Chief Complaint  Patient presents with  . Headache  . hand swelling   HPI Shawnna Pancake is a 20 y.o. G1P0000 at [redacted]w[redacted]d with di/di twins who presents with complaints of headache. Since last week has been having frequent headaches. Took 2 ES tylenol last night which relieved her symptoms but the headache returned this morning. Frontal headache that she rates 7/10. Pain worse with sunlight. No associated symptoms.  Has had increase in bilateral leg & hand swelling for the last week; worse at night. Currently denies swelling. Has chronic hypertension - no meds.  Denies abdominal pain, vaginal bleeding, or LOF. Good fetal movement x 2.   OB History    Gravida  1   Para  0   Term  0   Preterm  0   AB  0   Living  0     SAB  0   IAB  0   Ectopic  0   Multiple  0   Live Births  0           Past Medical History:  Diagnosis Date  . Anxiety   . COVID-19   . Depression   . GERD (gastroesophageal reflux disease)   . Tachycardia    no medications-intermittent    Past Surgical History:  Procedure Laterality Date  . NO PAST SURGERIES      Family History  Problem Relation Age of Onset  . Diabetes Mellitus II Mother   . Hypertension Mother   . Hypertension Father   . Stomach cancer Maternal Aunt   . Stomach cancer Paternal Grandmother     Social History   Tobacco Use  . Smoking status: Never Smoker  . Smokeless tobacco: Never Used  Vaping Use  . Vaping Use: Never used  Substance Use Topics  . Alcohol use: Not Currently  . Drug use: Not Currently    Allergies:  Allergies  Allergen Reactions  . Dust Mite Extract Hives  . Mixed Ragweed Hives  . Venofer [Iron Sucrose] Other (See Comments)    Chest pain    Medications Prior to Admission  Medication Sig Dispense Refill Last Dose  . aspirin EC  81 MG tablet Take 81 mg by mouth daily. Swallow whole.   09/30/2020 at Unknown time  . cetirizine (ZYRTEC) 10 MG tablet Take 10 mg by mouth daily.   09/30/2020 at Unknown time  . Doxylamine-Pyridoxine (DICLEGIS) 10-10 MG TBEC Take 2 tablets by mouth at bedtime. If symptoms persist, add one tablet in the morning and one in the afternoon 100 tablet 5 09/30/2020 at Unknown time  . famotidine (PEPCID) 20 MG tablet Take 1 tablet (20 mg total) by mouth 2 (two) times daily. 60 tablet 3 09/30/2020 at Unknown time  . pantoprazole (PROTONIX) 40 MG tablet Take 1 tablet (40 mg total) by mouth daily. 30 tablet 1 09/30/2020 at Unknown time  . Prenatal Vit-Fe Fumarate-FA (PRENATAL VITAMINS PO) Take 1 tablet by mouth daily.   09/30/2020 at Unknown time  . acetaminophen (TYLENOL) 500 MG tablet Take 500 mg by mouth every 6 (six) hours as needed.     . calcium carbonate (TUMS - DOSED IN MG ELEMENTAL CALCIUM) 500 MG chewable tablet Chew 1 tablet by mouth daily.     . cyclobenzaprine (FLEXERIL) 10 MG tablet Take 1  tablet (10 mg total) by mouth 3 (three) times daily as needed for muscle spasms. 30 tablet 0   . Ferrous Sulfate Dried (HIGH POTENCY IRON) 65 MG TABS Take by mouth.   09/28/2020  . fluticasone (FLONASE) 50 MCG/ACT nasal spray Place 1 spray into both nostrils in the morning and at bedtime. 9.9 mL 2   . ondansetron (ZOFRAN-ODT) 8 MG disintegrating tablet Take 1 tablet (8 mg total) by mouth every 8 (eight) hours as needed for nausea or vomiting. 20 tablet 0   . pseudoephedrine-acetaminophen (TYLENOL SINUS) 30-500 MG TABS tablet Take 1 tablet by mouth every 4 (four) hours as needed. (Patient not taking: Reported on 09/26/2020)     . sertraline (ZOLOFT) 50 MG tablet TAKE 1 TABLET BY MOUTH EVERY DAY 90 tablet 2     Review of Systems  Constitutional: Negative.   Eyes: Positive for photophobia. Negative for visual disturbance.  Cardiovascular: Positive for leg swelling.  Gastrointestinal: Negative.   Genitourinary:  Negative.   Neurological: Positive for headaches.   Physical Exam   Blood pressure 129/73, pulse (!) 104, last menstrual period 02/22/2020, SpO2 99 %.  Patient Vitals for the past 24 hrs:  BP Pulse SpO2  10/01/20 1047 129/73 (!) 104 99 %  10/01/20 1028 128/76 (!) 110 --  10/01/20 1019 131/77 (!) 117 --    Physical Exam Vitals and nursing note reviewed.  Constitutional:      General: She is not in acute distress.    Appearance: She is well-developed and normal weight.  HENT:     Head: Normocephalic and atraumatic.  Cardiovascular:     Heart sounds: Normal heart sounds.  Pulmonary:     Effort: Pulmonary effort is normal. No respiratory distress.  Musculoskeletal:     Right lower leg: No edema.     Left lower leg: No edema.  Skin:    General: Skin is warm and dry.  Neurological:     Mental Status: She is alert.     Deep Tendon Reflexes:     Reflex Scores:      Patellar reflexes are 2+ on the right side and 2+ on the left side.    Comments: No clonus  Psychiatric:        Mood and Affect: Mood normal.        Behavior: Behavior normal.     Fetal Tracing: Baby A Baseline: 140 Variability: moderate Accelerations: 15x15 Decelerations: none  Baby B Baseline: 145 Variability: moderate Accelerations:15x15 Decelerations: none  Toco: none  MAU Course  Procedures Results for orders placed or performed during the hospital encounter of 10/01/20 (from the past 24 hour(s))  Urinalysis, Routine w reflex microscopic Urine, Clean Catch     Status: Abnormal   Collection Time: 10/01/20 10:13 AM  Result Value Ref Range   Color, Urine YELLOW YELLOW   APPearance HAZY (A) CLEAR   Specific Gravity, Urine 1.010 1.005 - 1.030   pH 7.0 5.0 - 8.0   Glucose, UA NEGATIVE NEGATIVE mg/dL   Hgb urine dipstick NEGATIVE NEGATIVE   Bilirubin Urine NEGATIVE NEGATIVE   Ketones, ur NEGATIVE NEGATIVE mg/dL   Protein, ur NEGATIVE NEGATIVE mg/dL   Nitrite NEGATIVE NEGATIVE   Leukocytes,Ua  NEGATIVE NEGATIVE    MDM Reactive NST x 2. Fetal tracing close on monitor; in room the [facility] are obviously separate from each other. Tracing reviewed with Dr. Adrian Blackwater.   Patient presents with headache & recent hx of swelling. She has chronic hypertension and is not on  medication. She is normotensive in MAU. Headache treated with tylenol & reglan with good results. Patient has flexeril rx at home, will send in reglan rx to take with tylenol for future headaches.   Assessment and Plan   1. Pregnancy headache in third trimester  -Rx reglan -given instructions for treatment of future headaches -reviewed s/s of preeclampsia & reasons to return to MAU  2. Dichorionic diamniotic twin pregnancy in first trimester  -reactive NST x 2 -pt has close f/u with ob & MFM  3. [redacted] weeks gestation of pregnancy      Judeth Horn 10/01/2020, 11:43 AM

## 2020-10-01 NOTE — Telephone Encounter (Signed)
Patient called stating she is having some swelling in her hands and feet over the weekend. Patient also states she has had a headache with no relief from tylenol. Patient is [redacted] weeks pregnant with di/di twins. Patient reports good fetal movements and denies any bleeding/leaking of fluid. Patient instructed to go now to MAU for evaluation. Armandina Stammer RN

## 2020-10-01 NOTE — Discharge Instructions (Signed)
For prevention of migraines in pregnancy: -Magnesium, 400mg  by mouth, once daily -Vitamin B2, 400mg  by mouth, once daily  For treatment of migraines in pregnancy: -take medication at the first sign of the pain of a headache, or the first sign of your aura -start with 1000mg  Tylenol (do not exceed 4000mg  of Tylenol in 24hrs), with Reglan 10mg  -if no relief after 1-2hours, can take Flexeril 10mg        Hypertension During Pregnancy High blood pressure (hypertension) is when the force of blood pumping through the arteries is high enough to cause problems with your health. Arteries are blood vessels that carry blood from the heart throughout the body. Hypertension during pregnancy can cause problems for you and your baby. It can be mild or severe. There are different types of hypertension that can happen during pregnancy. These include:  Chronic hypertension. This happens when you had high blood pressure before you became pregnant, and it continues during the pregnancy. Hypertension that develops before you are [redacted] weeks pregnant and continues during the pregnancy is also called chronic hypertension. If you have chronic hypertension, it will not go away after you have your baby. You will need follow-up visits with your health care provider after you have your baby. Your health care provider may want you to keep taking medicine for your blood pressure.  Gestational hypertension. This is hypertension that develops after the 20th week of pregnancy. Gestational hypertension usually goes away after you have your baby, but your health care provider will need to monitor your blood pressure to make sure that it is getting better.  Postpartum hypertension. This is high blood pressure that was present before delivery and continues after delivery or that starts after delivery. This usually occurs within 48 hours after childbirth but may occur up to 6 weeks after giving birth. When hypertension during pregnancy  is severe, it is a medical emergency that requires treatment right away. How does this affect me? Women who have hypertension during pregnancy have a greater chance of developing hypertension later in life or during future pregnancies. In some cases, hypertension during pregnancy can cause serious complications, such as:  Stroke.  Heart attack.  Injury to other organs, such as kidneys, lungs, or liver.  Preeclampsia.  A condition called hemolysis, elevated liver enzymes, and low platelet count (HELLP) syndrome.  Convulsions or seizures.  Placental abruption. How does this affect my baby? Hypertension during pregnancy can affect your baby. Your baby may:  Be born early (prematurely).  Not weigh as much as he or she should at birth (low birth weight).  Not tolerate labor well, leading to an unplanned cesarean delivery. This condition may also result in a baby's death before birth (stillbirth). What are the risks? There are certain factors that make it more likely for you to develop hypertension during pregnancy. These include:  Having hypertension during a previous pregnancy or a family history of hypertension.  Being overweight.  Being age 20 or older.  Being pregnant for the first time.  Being pregnant with more than one baby.  Becoming pregnant using fertilization methods, such as IVF (in vitro fertilization).  Having other medical problems, such as diabetes, kidney disease, or lupus. What can I do to lower my risk? The exact cause of hypertension during pregnancy is not known. You may be able to lower your risk by:  Maintaining a healthy weight.  Eating a healthy and balanced diet.  Following your health care provider's instructions about treating any long-term conditions that  you had before becoming pregnant. It is very important to keep all of your prenatal care appointments. Your health care provider will check your blood pressure and make sure that your  pregnancy is progressing as expected. If a problem is found, early treatment can prevent complications.   How is this treated? Treatment for hypertension during pregnancy varies depending on the type of hypertension you have and how serious it is.  If you were taking medicine for high blood pressure before you became pregnant, talk with your health care provider. You may need to change medicine during pregnancy because some medicines, like ACE inhibitors, may not be considered safe for your baby.  If you have gestational hypertension, your health care provider may order medicine to treat this during pregnancy.  If you are at risk for preeclampsia, your health care provider may recommend that you take a low-dose aspirin during your pregnancy.  If you have severe hypertension, you may need to be hospitalized so you and your baby can be monitored closely. You may also need to be given medicine to lower your blood pressure.  In some cases, if your condition gets worse, you may need to deliver your baby early. Follow these instructions at home: Eating and drinking  Drink enough fluid to keep your urine pale yellow.  Avoid caffeine.   Lifestyle  Do not use any products that contain nicotine or tobacco. These products include cigarettes, chewing tobacco, and vaping devices, such as e-cigarettes. If you need help quitting, ask your health care provider.  Do not use alcohol or drugs.  Avoid stress as much as possible.  Rest and get plenty of sleep.  Regular exercise can help to reduce your blood pressure. Ask your health care provider what kinds of exercise are best for you. General instructions  Take over-the-counter and prescription medicines only as told by your health care provider.  Keep all prenatal and follow-up visits. This is important. Contact a health care provider if:  You have symptoms that your health care provider told you may require more treatment or monitoring, such  as: ? Headaches. ? Nausea or vomiting. ? Abdominal pain. ? Dizziness. ? Light-headedness. Get help right away if:  You have symptoms of serious complications, such as: ? Severe abdominal pain that does not get better with treatment. ? A severe headache that does not get better, blurred vision, or double vision. ? Vomiting that does not get better. ? Sudden, rapid weight gain or swelling in your hands, ankles, or face. ? Vaginal bleeding. ? Blood in your urine. ? Shortness of breath or chest pain. ? Weakness on one side of your body or difficulty speaking.  Your baby is not moving as much as usual. These symptoms may represent a serious problem that is an emergency. Do not wait to see if the symptoms will go away. Get medical help right away. Call your local emergency services (911 in the [place] Do not drive yourself to the hospital. Summary  Hypertension during pregnancy can cause problems for you and your baby.  Treatment for hypertension during pregnancy varies depending on the type of hypertension you have and how serious it is.  Keep all prenatal and follow-up visits. This is important.  Get help right away if you have symptoms of serious complications related to high blood pressure. This information is not intended to replace advice given to you by your health care provider. Make sure you discuss any questions you have with your health care provider. Document  Revised: 02/23/2020 Document Reviewed: 02/23/2020 Elsevier Patient Education  2021 [employer].

## 2020-10-01 NOTE — MAU Note (Signed)
...  Alexis [name] is a 20 y.o. at [redacted]w[redacted]d here in MAU reporting: "Ever since Thursday of last week I have had headaches each day and swelling in my hands where I can't even take my ring off and it hurts my hand to close it. I have had swelling in my feet as well." She states she called her OB this morning but they instructed her to come to MAU as they had no available appointments.  No RUQ pain or current edema, but states at times her eyes are blurry and have hard time focusing. Upon further investigation, patient states "It happens when it's sunny outside." Absent clonus and plus 2 reflexes.   +FM. No VB or LOF.   BP: 131/77 P: 117 T: 98.9 F oral R: 19 O2: 100%   Pain score: 7/10 Lab orders placed from triage:  UA

## 2020-10-03 ENCOUNTER — Encounter: Payer: Self-pay | Admitting: *Deleted

## 2020-10-03 ENCOUNTER — Other Ambulatory Visit: Payer: Self-pay

## 2020-10-03 ENCOUNTER — Encounter: Payer: Medicaid Other | Admitting: Family Medicine

## 2020-10-03 ENCOUNTER — Ambulatory Visit: Payer: Medicaid Other | Attending: Maternal & Fetal Medicine

## 2020-10-03 ENCOUNTER — Ambulatory Visit: Payer: Medicaid Other | Admitting: *Deleted

## 2020-10-03 DIAGNOSIS — O30041 Twin pregnancy, dichorionic/diamniotic, first trimester: Secondary | ICD-10-CM | POA: Diagnosis present

## 2020-10-03 DIAGNOSIS — Z3A32 32 weeks gestation of pregnancy: Secondary | ICD-10-CM | POA: Diagnosis not present

## 2020-10-03 DIAGNOSIS — O30043 Twin pregnancy, dichorionic/diamniotic, third trimester: Secondary | ICD-10-CM | POA: Diagnosis not present

## 2020-10-03 DIAGNOSIS — I10 Essential (primary) hypertension: Secondary | ICD-10-CM | POA: Insufficient documentation

## 2020-10-03 DIAGNOSIS — O359XX2 Maternal care for (suspected) fetal abnormality and damage, unspecified, fetus 2: Secondary | ICD-10-CM

## 2020-10-03 DIAGNOSIS — O0991 Supervision of high risk pregnancy, unspecified, first trimester: Secondary | ICD-10-CM

## 2020-10-03 DIAGNOSIS — O10013 Pre-existing essential hypertension complicating pregnancy, third trimester: Secondary | ICD-10-CM

## 2020-10-03 NOTE — Progress Notes (Signed)
C/o" headache  10/02/20"

## 2020-10-05 ENCOUNTER — Other Ambulatory Visit (HOSPITAL_COMMUNITY)
Admission: RE | Admit: 2020-10-05 | Discharge: 2020-10-05 | Disposition: A | Discharge status: Home / Self Care | Payer: Medicaid Other | Source: Ambulatory Visit | Attending: Family Medicine | Admitting: Family Medicine

## 2020-10-05 ENCOUNTER — Ambulatory Visit (INDEPENDENT_AMBULATORY_CARE_PROVIDER_SITE_OTHER): Payer: Medicaid Other

## 2020-10-05 ENCOUNTER — Other Ambulatory Visit: Payer: Self-pay

## 2020-10-05 VITALS — BP 136/78 | HR 105 | Ht 65.0 in | Wt 187.0 lb

## 2020-10-05 DIAGNOSIS — R300 Dysuria: Secondary | ICD-10-CM | POA: Insufficient documentation

## 2020-10-05 DIAGNOSIS — N898 Other specified noninflammatory disorders of vagina: Secondary | ICD-10-CM

## 2020-10-05 LAB — POCT URINALYSIS DIPSTICK
Bilirubin, UA: NEGATIVE
Blood, UA: NEGATIVE
Glucose, UA: NEGATIVE
Ketones, UA: NEGATIVE
Leukocytes, UA: NEGATIVE
Nitrite, UA: NEGATIVE
Protein, UA: NEGATIVE
Spec Grav, UA: 1.015 (ref 1.010–1.025)
Urobilinogen, UA: 0.2 E.U./dL
pH, UA: 6 (ref 5.0–8.0)

## 2020-10-05 NOTE — Progress Notes (Signed)
Chart reviewed - agree with CMA/RN documentation.  ° °

## 2020-10-05 NOTE — Progress Notes (Signed)
Patient seen today complaining of dysuria, vaginal pressure and discharge.  Babies moving well.  Urine and swab ordered.  Patient follows up next week.

## 2020-10-07 LAB — URINE CULTURE

## 2020-10-08 ENCOUNTER — Telehealth: Payer: Self-pay

## 2020-10-08 LAB — CERVICOVAGINAL ANCILLARY ONLY
Bacterial Vaginitis (gardnerella): NEGATIVE
Candida Glabrata: NEGATIVE
Candida Vaginitis: NEGATIVE
Chlamydia: NEGATIVE
Comment: NEGATIVE
Comment: NEGATIVE
Comment: NEGATIVE
Comment: NEGATIVE
Comment: NEGATIVE
Comment: NORMAL
Neisseria Gonorrhea: NEGATIVE
Trichomonas: NEGATIVE

## 2020-10-08 NOTE — Telephone Encounter (Signed)
Patient called wanting to move up her Thursday appointment to earlier this week. Patient states she is having some irregular contractions. Informed her I dont have an appointment today but can see her tomorrow afternoon- INformed patient that if her cramping/contractions increase she will need to seek care at MAU. Also if she has any decrease movement or bleeding. Patient states understanding. Armandina Stammer RN

## 2020-10-09 ENCOUNTER — Ambulatory Visit (INDEPENDENT_AMBULATORY_CARE_PROVIDER_SITE_OTHER): Payer: Medicaid Other | Admitting: Family Medicine

## 2020-10-09 ENCOUNTER — Other Ambulatory Visit: Payer: Self-pay

## 2020-10-09 VITALS — BP 121/81 | HR 102 | Wt 189.0 lb

## 2020-10-09 DIAGNOSIS — Z23 Encounter for immunization: Secondary | ICD-10-CM

## 2020-10-09 DIAGNOSIS — O30043 Twin pregnancy, dichorionic/diamniotic, third trimester: Secondary | ICD-10-CM

## 2020-10-09 DIAGNOSIS — U071 COVID-19: Secondary | ICD-10-CM | POA: Insufficient documentation

## 2020-10-09 DIAGNOSIS — I10 Essential (primary) hypertension: Secondary | ICD-10-CM

## 2020-10-09 DIAGNOSIS — F419 Anxiety disorder, unspecified: Secondary | ICD-10-CM | POA: Insufficient documentation

## 2020-10-09 DIAGNOSIS — Z3A32 32 weeks gestation of pregnancy: Secondary | ICD-10-CM

## 2020-10-09 DIAGNOSIS — O0990 Supervision of high risk pregnancy, unspecified, unspecified trimester: Secondary | ICD-10-CM

## 2020-10-09 NOTE — Progress Notes (Signed)
   PRENATAL VISIT NOTE  Subjective:  Alexis [name] is a 20 y.o. G1P0000 at [redacted]w[redacted]d being seen today for ongoing prenatal care.  She is currently monitored for the following issues for this high-risk pregnancy and has GERD (gastroesophageal reflux disease); Dichorionic diamniotic twin pregnancy in first trimester; Supervision of high risk pregnancy, antepartum; Chronic hypertension; and COVID-19 on their problem list.  Patient reports no complaints.  Contractions: Irregular. Vag. Bleeding: None.  Movement: Present. Denies leaking of fluid.   The following portions of the patient's history were reviewed and updated as appropriate: allergies, current medications, past family history, past medical history, past social history, past surgical history and problem list.   Objective:   Vitals:   10/09/20 1029  BP: 121/81  Pulse: (!) 102  Weight: 189 lb (85.7 kg)    Fetal Status: Fetal Heart Rate (bpm): 139/147   Movement: Present     General:  Alert, oriented and cooperative. Patient is in no acute distress.  Skin: Skin is warm and dry. No rash noted.   Cardiovascular: Normal heart rate noted  Respiratory: Normal respiratory effort, no problems with respiration noted  Abdomen: Soft, gravid, appropriate for gestational age.  Pain/Pressure: Present     Pelvic: Cervical exam deferred        Extremities: Normal range of motion.  Edema: Trace  Mental Status: Normal mood and affect. Normal behavior. Normal judgment and thought content.   Assessment and Plan:  Pregnancy: G1P0000 at [redacted]w[redacted]d 1. Dichorionic diamniotic twin pregnancy in third trimester -Doing well, following for growth/BPP with MFM -Taking PNV and bASA -BPP with MFM scheduled tomorrow 10/10/20 -Tdap given today, not previously given -Discussed contraception today-leaning towards IUD at postpartum visit, unsure liletta vs paragard  2. Chronic hypertension BP well controlled today, not on meds. preE labs previously normal. Continue  to monitor.  3. Supervision of high risk pregnancy, antepartum  4. COVID-19 06/2020. Received vaccines x3.  5. Anxiety History of anxiety, previously on multiple meds prior to pregnancy. Patient with multiple calls to clinic and MAU visits anxious this pregnancy. Discussed extensively counseling vs medication initiation. Will refer to [facility] today, patient amendable.   Preterm labor symptoms and general obstetric precautions including but not limited to vaginal bleeding, contractions, leaking of fluid and fetal movement were reviewed in detail with the patient. Please refer to After Visit Summary for other counseling recommendations.   Return in about 2 weeks (around 10/23/2020) for HROB; in person.  Future Appointments  Date Time Provider Department Center  10/10/2020  9:30 AM [facility] NURSE [facility]  10/10/2020  9:45 AM [facility] US5 [facility]  10/23/2020  8:35 AM [facility] ECHO 1 [id number]  10/24/2020  9:15 AM [name], DO [facility] None  10/31/2020  8:45 AM [name], DO [facility] None  11/08/2020  8:45 AM Willodean Rosenthal, MD [facility] None  11/16/2020  8:40 AM Little Ishikawa, MD [facility]    Alric Seton, MD

## 2020-10-10 ENCOUNTER — Ambulatory Visit: Payer: Medicaid Other | Attending: Maternal & Fetal Medicine

## 2020-10-10 ENCOUNTER — Encounter: Payer: Self-pay | Admitting: *Deleted

## 2020-10-10 ENCOUNTER — Ambulatory Visit: Payer: Medicaid Other | Admitting: *Deleted

## 2020-10-10 ENCOUNTER — Other Ambulatory Visit: Payer: Self-pay | Admitting: *Deleted

## 2020-10-10 DIAGNOSIS — O30041 Twin pregnancy, dichorionic/diamniotic, first trimester: Secondary | ICD-10-CM | POA: Insufficient documentation

## 2020-10-10 DIAGNOSIS — O359XX2 Maternal care for (suspected) fetal abnormality and damage, unspecified, fetus 2: Secondary | ICD-10-CM | POA: Diagnosis not present

## 2020-10-10 DIAGNOSIS — O0990 Supervision of high risk pregnancy, unspecified, unspecified trimester: Secondary | ICD-10-CM

## 2020-10-10 DIAGNOSIS — O30043 Twin pregnancy, dichorionic/diamniotic, third trimester: Secondary | ICD-10-CM | POA: Insufficient documentation

## 2020-10-10 DIAGNOSIS — Z3A33 33 weeks gestation of pregnancy: Secondary | ICD-10-CM | POA: Diagnosis not present

## 2020-10-10 DIAGNOSIS — O10013 Pre-existing essential hypertension complicating pregnancy, third trimester: Secondary | ICD-10-CM | POA: Diagnosis not present

## 2020-10-10 DIAGNOSIS — I10 Essential (primary) hypertension: Secondary | ICD-10-CM | POA: Diagnosis present

## 2020-10-11 ENCOUNTER — Encounter: Payer: Medicaid Other | Admitting: Family Medicine

## 2020-10-15 ENCOUNTER — Telehealth: Payer: Self-pay | Admitting: Licensed Clinical Social Worker

## 2020-10-15 NOTE — Telephone Encounter (Signed)
Patient called to cancel her appointment. She stated she would call back if she would like to reschedule.

## 2020-10-16 ENCOUNTER — Telehealth: Payer: Self-pay

## 2020-10-16 MED ORDER — AMOXICILLIN-POT CLAVULANATE 875-125 MG PO TABS: 1.0000 | ORAL_TABLET | Freq: Two times a day (BID) | ORAL | 1 refills | Status: DC

## 2020-10-16 NOTE — Telephone Encounter (Signed)
Pt called stating she had 4 contractions 1 minute apart and the last contraction was at 11:33 am. Advised pt to try drinking water and lie down for an hour and if the contractions are consistent she should go to [facility]. Pt also advised to go to [facility] if she notices a gush of fluids. Understanding was voiced. Jequan Shahin l Hendy Brindle, CMA

## 2020-10-17 ENCOUNTER — Encounter: Payer: Medicaid Other | Admitting: Licensed Clinical Social Worker

## 2020-10-18 ENCOUNTER — Other Ambulatory Visit: Payer: Self-pay

## 2020-10-18 ENCOUNTER — Encounter: Payer: Medicaid Other | Admitting: Family Medicine

## 2020-10-18 ENCOUNTER — Inpatient Hospital Stay (HOSPITAL_COMMUNITY)
Admission: AD | Admit: 2020-10-18 | Discharge: 2020-10-18 | Disposition: A | Discharge status: Home / Self Care | Payer: Medicaid Other | Attending: Obstetrics and Gynecology | Admitting: Obstetrics and Gynecology

## 2020-10-18 DIAGNOSIS — I10 Essential (primary) hypertension: Secondary | ICD-10-CM

## 2020-10-18 DIAGNOSIS — O99613 Diseases of the digestive system complicating pregnancy, third trimester: Secondary | ICD-10-CM | POA: Insufficient documentation

## 2020-10-18 DIAGNOSIS — Z7982 Long term (current) use of aspirin: Secondary | ICD-10-CM | POA: Insufficient documentation

## 2020-10-18 DIAGNOSIS — Z3A34 34 weeks gestation of pregnancy: Secondary | ICD-10-CM

## 2020-10-18 DIAGNOSIS — O30003 Twin pregnancy, unspecified number of placenta and unspecified number of amniotic sacs, third trimester: Secondary | ICD-10-CM | POA: Insufficient documentation

## 2020-10-18 DIAGNOSIS — O99891 Other specified diseases and conditions complicating pregnancy: Secondary | ICD-10-CM

## 2020-10-18 DIAGNOSIS — K0889 Other specified disorders of teeth and supporting structures: Secondary | ICD-10-CM | POA: Diagnosis not present

## 2020-10-18 DIAGNOSIS — O30041 Twin pregnancy, dichorionic/diamniotic, first trimester: Secondary | ICD-10-CM

## 2020-10-18 DIAGNOSIS — O0990 Supervision of high risk pregnancy, unspecified, unspecified trimester: Secondary | ICD-10-CM

## 2020-10-18 MED ORDER — OXYCODONE-ACETAMINOPHEN 5-325 MG PO TABS: 1.0000 | ORAL_TABLET | Freq: Four times a day (QID) | ORAL | 0 refills | Status: DC | PRN

## 2020-10-18 MED ORDER — OXYCODONE-ACETAMINOPHEN 5-325 MG PO TABS
1.0000 | ORAL_TABLET | Freq: Once | ORAL | Status: AC
Start: 2020-10-18 — End: 2020-10-18
  Administered 2020-10-18: 1 via ORAL
  Filled 2020-10-18: qty 1

## 2020-10-18 NOTE — Discharge Instructions (Signed)
Dental Pain Dental pain is often a sign that something is wrong with your teeth or gums. It is also something that can occur following dental treatment. If you have dental pain, it is important to contact your dental care provider, especially if the cause of the pain has not been determined. Dental pain may be of varying intensity and can be caused by many things, including:  Tooth decay (cavities or caries). Cavities are caused by bacteria that produce acids that irritate the nerve of your tooth, making it sensitive to air and hot or cold temperatures. This eventually causes discomfort or pain.  Abscess or infection. Once the bacteria reach the inner part of the tooth (pulp), a bacterial infection (dental abscess) can occur. Pus typically collects at the end of the root of a tooth.  Injury.  A crack in the tooth.  Gum recession exposing the root, and possibly the nerves, of a tooth.  Gum (periodontal)disease.  Abnormal grinding or clenching.  Poor or improper home care.  An unknown reason (idiopathic). Your pain may be mild or severe. It may occur when you are:  Chewing.  Exposed to hot or cold temperatures.  Eating or drinking sugary foods or beverages, such as soda or candy. Your pain may be constant, or it may come and go without cause. Follow these instructions at home: The following actions may help to lessen any discomfort that you are feeling before or after getting dental care. Medicines  Take over-the-counter and prescription medicines only as told by your dental care provider.  If you were prescribed an antibiotic medicine, take it as told by your dental care provider. Do not stop taking the antibiotic even if you start to feel better. Eating and drinking Avoid foods or drinks that cause you pain, such as:  Very hot or very cold foods or drinks.  Sweet or sugary foods or drinks. Managing pain and swelling  Ice can sometimes be used to reduce pain and swelling,  especially if the pain is following dental treatment.  If directed, put ice on the painful area of your face. To do this: ? Put ice in a plastic bag. ? Place a towel between your skin and the bag. ? Leave the ice on for 20 minutes, 2-3 times a day. ? Remove the ice if your skin turns bright red. This is very important. If you cannot feel pain, heat, or cold, you have a greater risk of damage to the area.   Brushing your teeth  To keep your mouth and gums healthy, brush your teeth twice a day using a fluoride toothpaste.  Use a toothpaste made for sensitive teeth as directed by your dental care provider, especially if the root is exposed.  Always brush your teeth with a soft-bristled toothbrush. This will help prevent irritation to your gums. General instructions  Floss at least once a day.  Do not apply heat to the outside of the face.  Gargle with a mixture of salt and water 3-4 times a day or as needed. To make salt water, completely dissolve -1 tsp (3-6 g) of salt in 1 cup (237 mL) of warm water.  Keep all follow-up visits. This is important. Contact a dental care provider if:  You have any unexplained dental pain.  Your pain is not controlled with medicines.  Your symptoms get worse.  You have new symptoms. Get help right away if:  You are unable to open your mouth.  You are having trouble breathing or   swallowing.  You have a fever.  You notice that your face, neck, or jaw is swollen. These symptoms may represent a serious problem that is an emergency. Do not wait to see if the symptoms will go away. Get medical help right away. Call your local emergency services (911 in the [place] Do not drive yourself to the hospital. Summary  Dental pain may be caused by many things, including tooth decay and infection.  Your pain may be mild or severe.  Take over-the-counter and prescription medicines only as told by your dental care provider.  Watch your dental pain for any  changes. Let your dental care provider know if your symptoms get worse. This information is not intended to replace advice given to you by your health care provider. Make sure you discuss any questions you have with your health care provider. Document Revised: 03/07/2020 Document Reviewed: 03/07/2020 Elsevier Patient Education  2021 [employer]  

## 2020-10-18 NOTE — MAU Provider Note (Signed)
Chief Complaint:  Dental Pain   Event Date/Time   First Provider Initiated Contact with Patient 10/18/20 2023     HPI: Alexis [name] is a 20 y.o. G1P0000 at 7w1dwho presents to maternity admissions reporting pain in right jaw after numbing med wore off after having a cavity filled.  Took Tylenol without relief. No fever. She reports good fetal movement, denies LOF, vaginal bleeding, vaginal itching/burning, urinary symptoms, h/a, dizziness, n/v, diarrhea, constipation or fever/chills.    Dental Pain  This is a new problem. The current episode started today. The problem occurs constantly. The problem has been unchanged. Associated symptoms include facial pain. Pertinent negatives include no difficulty swallowing, fever, oral bleeding, sinus pressure or thermal sensitivity. She has tried acetaminophen for the symptoms. The treatment provided no relief.    RN Note: Went to dentist this am for filling in R lower tooth. After numbing wore off my tooth is now throbbing and my dentist is closed til Monday. Took Tylenol 500mg  at 2p and another 500mg  at 1600. Took Tylenol 500mg  at 1800 and nothing has helped. No pregnancy concerns. Good FM  Past Medical History: Past Medical History:  Diagnosis Date  . Anxiety   . COVID-19   . Depression   . GERD (gastroesophageal reflux disease)   . Tachycardia    no medications-intermittent    Past obstetric history: OB History  Gravida Para Term Preterm AB Living  1 0 0 0 0 0  SAB IAB Ectopic Multiple Live Births  0 0 0 0 0    # Outcome Date GA Lbr Len/2nd Weight Sex Delivery Anes PTL Lv  1 Current             Past Surgical History: Past Surgical History:  Procedure Laterality Date  . NO PAST SURGERIES      Family History: Family History  Problem Relation Age of Onset  . Diabetes Mellitus II Mother   . Hypertension Mother   . Hypertension Father   . Stomach cancer Maternal Aunt   . Stomach cancer Paternal Grandmother     Social  History: Social History   Tobacco Use  . Smoking status: Never Smoker  . Smokeless tobacco: Never Used  Vaping Use  . Vaping Use: Never used  Substance Use Topics  . Alcohol use: Not Currently  . Drug use: Not Currently    Allergies:  Allergies  Allergen Reactions  . Dust Mite Extract Hives  . Mixed Ragweed Hives  . Venofer [Iron Sucrose] Other (See Comments)    Chest pain    Meds:  Medications Prior to Admission  Medication Sig Dispense Refill Last Dose  . acetaminophen (TYLENOL) 500 MG tablet Take 500 mg by mouth every 6 (six) hours as needed.     amoxicillin-clavulanate (AUGMENTIN) 875-125 MG tablet Take 1 tablet by mouth 2 (two) times daily. 20 tablet 1   . aspirin EC 81 MG tablet Take 81 mg by mouth daily. Swallow whole.     . calcium carbonate (TUMS - DOSED IN MG ELEMENTAL CALCIUM) 500 MG chewable tablet Chew 1 tablet by mouth daily.     . cetirizine (ZYRTEC) 10 MG tablet Take 10 mg by mouth daily.     . cyclobenzaprine (FLEXERIL) 10 MG tablet Take 1 tablet (10 mg total) by mouth 3 (three) times daily as needed for muscle spasms. (Patient not taking: No sig reported) 30 tablet 0   . Doxylamine-Pyridoxine (DICLEGIS) 10-10 MG TBEC Take 2 tablets by mouth at bedtime. If  symptoms persist, add one tablet in the morning and one in the afternoon 100 tablet 5   . famotidine (PEPCID) 20 MG tablet Take 1 tablet (20 mg total) by mouth 2 (two) times daily. 60 tablet 3   . Ferrous Sulfate Dried (HIGH POTENCY IRON) 65 MG TABS Take by mouth.     . fluticasone (FLONASE) 50 MCG/ACT nasal spray Place 1 spray into both nostrils in the morning and at bedtime. (Patient not taking: No sig reported) 9.9 mL 2   . metoCLOPramide (REGLAN) 10 MG tablet Take 1 tablet (10 mg total) by mouth every 8 (eight) hours as needed (take with tylenol for headache). (Patient not taking: No sig reported) 30 tablet 0   . ondansetron (ZOFRAN-ODT) 8 MG disintegrating tablet Take 1 tablet (8 mg total) by mouth every  8 (eight) hours as needed for nausea or vomiting. (Patient not taking: No sig reported) 20 tablet 0   . pantoprazole (PROTONIX) 40 MG tablet Take 1 tablet (40 mg total) by mouth daily. (Patient not taking: Reported on 10/09/2020) 30 tablet 1   . Prenatal Vit-Fe Fumarate-FA (PRENATAL VITAMINS PO) Take 1 tablet by mouth daily.     . sertraline (ZOLOFT) 50 MG tablet TAKE 1 TABLET BY MOUTH EVERY DAY (Patient not taking: No sig reported) 90 tablet 2     I have reviewed patient's Past Medical Hx, Surgical Hx, Family Hx, Social Hx, medications and allergies.   ROS:  Review of Systems  Constitutional: Negative for fever.  HENT: Negative for sinus pressure.    Other systems negative  Physical Exam   Patient Vitals for the past 24 hrs:  BP Temp Pulse Resp Height Weight  10/18/20 1957 136/83 -- 100 -- -- --  10/18/20 1954 -- 98.4 F (36.9 C) -- 16 5\' 5"  (1.651 m) 88.5 kg   Constitutional: Well-developed, well-nourished female in no acute distress.  Cardiovascular: normal rate Respiratory: normal effort GI: Abd soft, non-tender, gravid appropriate for gestational age MS: Extremities nontender, no edema, normal ROM Neurologic: Alert and oriented x 4.  GU: Neg CVAT.  FHT:  135   Labs: No results found for this or any previous visit (from the past 24 hour(s)). O/Positive/-- (10/25 1417)  Imaging:    MAU Course/MDM: Discussed pain is probably from injection in jaw and will likely resolve in a day or two Rx #5 Percocet prn pain, warned no driving Treatments in MAU included single percocet for pain.    Assessment: Twin IUP at [redacted]w[redacted]d Tooth pain  Plan: Discharge home Rx Percocet prn #5 no refills Preterm Labor precautions and fetal kick counts Follow up in Office for prenatal visits   Pt stable at time of discharge.  [redacted]w[redacted]d CNM, MSN Certified Nurse-Midwife 10/18/2020 8:23 PM

## 2020-10-18 NOTE — MAU Note (Signed)
Went to dentist this am for filling in R lower tooth. After numbing wore off my tooth is now throbbing and my dentist is closed til Monday. Took Tylenol 500mg  at 2p and another 500mg  at 1600. Took Tylenol 500mg  at 1800 and nothing has helped. No pregnancy concerns. Good FM

## 2020-10-18 NOTE — MAU Note (Signed)
Wynelle Bourgeois CNM in Triage to see pt and discuss d/c plan. Written and verbal d/c instructions given and understanding voiced.

## 2020-10-23 ENCOUNTER — Encounter (HOSPITAL_COMMUNITY): Payer: Self-pay | Admitting: Cardiology

## 2020-10-23 ENCOUNTER — Ambulatory Visit (HOSPITAL_COMMUNITY): Payer: Medicaid Other | Attending: Cardiology

## 2020-10-24 ENCOUNTER — Ambulatory Visit (INDEPENDENT_AMBULATORY_CARE_PROVIDER_SITE_OTHER): Payer: Medicaid Other | Admitting: Family Medicine

## 2020-10-24 ENCOUNTER — Encounter: Payer: Self-pay | Admitting: General Practice

## 2020-10-24 ENCOUNTER — Other Ambulatory Visit (HOSPITAL_COMMUNITY)
Admission: RE | Admit: 2020-10-24 | Discharge: 2020-10-24 | Disposition: A | Discharge status: Home / Self Care | Payer: Medicaid Other | Source: Ambulatory Visit | Attending: Family Medicine | Admitting: Family Medicine

## 2020-10-24 ENCOUNTER — Other Ambulatory Visit: Payer: Self-pay

## 2020-10-24 VITALS — BP 148/86 | HR 90 | Wt 201.0 lb

## 2020-10-24 DIAGNOSIS — Z3A35 35 weeks gestation of pregnancy: Secondary | ICD-10-CM | POA: Diagnosis not present

## 2020-10-24 DIAGNOSIS — O30041 Twin pregnancy, dichorionic/diamniotic, first trimester: Secondary | ICD-10-CM

## 2020-10-24 DIAGNOSIS — I10 Essential (primary) hypertension: Secondary | ICD-10-CM

## 2020-10-24 DIAGNOSIS — O0993 Supervision of high risk pregnancy, unspecified, third trimester: Secondary | ICD-10-CM | POA: Insufficient documentation

## 2020-10-24 DIAGNOSIS — O0990 Supervision of high risk pregnancy, unspecified, unspecified trimester: Secondary | ICD-10-CM

## 2020-10-24 DIAGNOSIS — K219 Gastro-esophageal reflux disease without esophagitis: Secondary | ICD-10-CM

## 2020-10-24 MED ORDER — LABETALOL HCL 200 MG PO TABS
200.0000 mg | ORAL_TABLET | Freq: Two times a day (BID) | ORAL | 3 refills | Status: DC
Start: 2020-10-24 — End: 2020-11-03

## 2020-10-24 NOTE — Progress Notes (Signed)
TR-Protein Neg-Glucose

## 2020-10-24 NOTE — Progress Notes (Signed)
PRENATAL VISIT NOTE  Subjective:  Alexis [name] is a 20 y.o. G1P0000 at 12w0dbeing seen today for ongoing prenatal care.  She is currently monitored for the following issues for this high-risk pregnancy and has GERD (gastroesophageal reflux disease); Dichorionic diamniotic twin pregnancy in first trimester; Supervision of high risk pregnancy, antepartum; Chronic hypertension; COVID-19; and Anxiety on their problem list.  Patient reports tooth pain. Had cavity filled, but still having a lot of tooth pain. Has been seeing dentist. Will be going back soon. Some pelvic pressure..  Contractions: Irritability. Vag. Bleeding: None.  Movement: Present. Denies leaking of fluid.   The following portions of the patient's history were reviewed and updated as appropriate: allergies, current medications, past family history, past medical history, past social history, past surgical history and problem list.   Objective:   Vitals:   10/24/20 0910  BP: (!) 148/86  Pulse: 90  Weight: 201 lb (91.2 kg)    Fetal Status: Fetal Heart Rate (bpm): 136/142 Fundal Height: 39 cm Movement: Present     General:  Alert, oriented and cooperative. Patient is in no acute distress.  Skin: Skin is warm and dry. No rash noted.   Cardiovascular: Normal heart rate noted  Respiratory: Normal respiratory effort, no problems with respiration noted  Abdomen: Soft, gravid, appropriate for gestational age.  Pain/Pressure: Present     Pelvic: Cervical exam performed in the presence of a chaperone        Extremities: Normal range of motion.  Edema: Trace  Mental Status: Normal mood and affect. Normal behavior. Normal judgment and thought content.   Assessment and Plan:  Pregnancy: G1P0000 at 331w0d. [redacted] weeks gestation of pregnancy - CBC - Comp Met (CMET) - Protein / creatinine ratio, urine - labetalol (NORMODYNE) 200 MG tablet; Take 1 tablet (200 mg total) by mouth 2 (two) times daily.  Dispense: 60 tablet; Refill:  3 - GC/Chlamydia probe amp [facility], beta strep (group b only)  2. Supervision of high risk pregnancy, antepartum FHT and FH normal - CBC - Comp Met (CMET) - Protein / creatinine ratio, urine - labetalol (NORMODYNE) 200 MG tablet; Take 1 tablet (200 mg total) by mouth 2 (two) times daily.  Dispense: 60 tablet; Refill: 3 - GC[facility]Chlamydia probe amp [facility], beta strep (group b only)  3. Chronic hypertension Bp elevated today Start labetalol Check labs - CBC - Comp Met (CMET) - Protein / creatinine ratio, urine - labetalol (NORMODYNE) 200 MG tablet; Take 1 tablet (200 mg total) by mouth 2 (two) times daily.  Dispense: 60 tablet; Refill: 3 - [facility]lam[facility]. Dichorionic diamniotic twin pregnancy in first trimester Last Growth: 10/03/20 EFW: 28%/36% Vtx/Vtx Concordant growth Antenatal testing - CBC - Comp Met (CMET) - Protein / creatinine ratio, urine - labetalol (NORMODYNE) 200 MG tablet; Take 1 tablet (200 mg total) by mouth 2 (two) times daily.  Dispense: 60 tablet; Refill: 3 - [facility]lam[facility]. Gastroesophageal reflux disease, unspecified whether esophagitis present  - CBC - Comp Met (CMET) - Protein / creatinine ratio, urine - labetalol (NORMODYNE) 200 MG tablet; Take 1 tablet (200 mg total) by mouth 2 (two) times daily.  Dispense: 60 tablet; Refill: 3 - [facility]Chlamydia probe amp ()[facility] symptoms and general obstetric precautions including but not limited to vaginal bleeding, contractions, leaking of fluid and fetal movement were  reviewed in detail with the patient. Please refer to After Visit Summary for other counseling recommendations.   No follow-ups on file.  Future Appointments  Date Time Provider [place]  10/31/2020  8:45 AM Truett Mainland, DO [facility] None  10/31/2020 12:30 PM [facility] NURSE [facility]   10/31/2020 12:45 PM [facility] US4 [facility]  11/07/2020 10:15 AM [facility] NURSE [facility]  11/07/2020 10:30 AM [facility] US3 [facility]  11/08/2020  8:45 AM Lavonia Drafts, MD [facility] None  11/16/2020  8:40 AM Donato Heinz, MD [facility] [place], DO

## 2020-10-25 ENCOUNTER — Other Ambulatory Visit: Payer: Self-pay

## 2020-10-25 ENCOUNTER — Inpatient Hospital Stay (HOSPITAL_COMMUNITY)
Admission: AD | Admit: 2020-10-25 | Discharge: 2020-10-26 | Disposition: A | Discharge status: Home / Self Care | Payer: Medicaid Other | Attending: Obstetrics & Gynecology | Admitting: Obstetrics & Gynecology

## 2020-10-25 ENCOUNTER — Encounter (HOSPITAL_COMMUNITY): Payer: Self-pay | Admitting: Obstetrics & Gynecology

## 2020-10-25 DIAGNOSIS — R519 Headache, unspecified: Secondary | ICD-10-CM | POA: Diagnosis not present

## 2020-10-25 DIAGNOSIS — O26893 Other specified pregnancy related conditions, third trimester: Secondary | ICD-10-CM | POA: Insufficient documentation

## 2020-10-25 DIAGNOSIS — I10 Essential (primary) hypertension: Secondary | ICD-10-CM

## 2020-10-25 DIAGNOSIS — O10913 Unspecified pre-existing hypertension complicating pregnancy, third trimester: Secondary | ICD-10-CM | POA: Insufficient documentation

## 2020-10-25 DIAGNOSIS — Z8616 Personal history of COVID-19: Secondary | ICD-10-CM | POA: Insufficient documentation

## 2020-10-25 DIAGNOSIS — O30043 Twin pregnancy, dichorionic/diamniotic, third trimester: Secondary | ICD-10-CM | POA: Diagnosis not present

## 2020-10-25 DIAGNOSIS — O30041 Twin pregnancy, dichorionic/diamniotic, first trimester: Secondary | ICD-10-CM

## 2020-10-25 DIAGNOSIS — O10013 Pre-existing essential hypertension complicating pregnancy, third trimester: Secondary | ICD-10-CM | POA: Diagnosis not present

## 2020-10-25 DIAGNOSIS — Z3A35 35 weeks gestation of pregnancy: Secondary | ICD-10-CM | POA: Insufficient documentation

## 2020-10-25 DIAGNOSIS — O0990 Supervision of high risk pregnancy, unspecified, unspecified trimester: Secondary | ICD-10-CM

## 2020-10-25 LAB — CBC
HCT: 31.5 % — ABNORMAL LOW (ref 36.0–46.0)
Hematocrit: 29.2 % — ABNORMAL LOW (ref 34.0–46.6)
Hemoglobin: 10.2 g/dL — ABNORMAL LOW (ref 11.1–15.9)
Hemoglobin: 10.2 g/dL — ABNORMAL LOW (ref 12.0–15.0)
MCH: 32.3 pg (ref 26.6–33.0)
MCH: 32 pg (ref 26.0–34.0)
MCHC: 34.9 g/dL (ref 31.5–35.7)
MCHC: 32.4 g/dL (ref 30.0–36.0)
MCV: 92 fL (ref 79–97)
MCV: 98.7 fL (ref 80.0–100.0)
Platelets: 182 10*3/uL (ref 150–450)
Platelets: 192 10*3/uL (ref 150–400)
RBC: 3.16 x10E6/uL — ABNORMAL LOW (ref 3.77–5.28)
RBC: 3.19 MIL/uL — ABNORMAL LOW (ref 3.87–5.11)
RDW: 13 % (ref 11.7–15.4)
RDW: 14.2 % (ref 11.5–15.5)
WBC: 5.9 10*3/uL (ref 3.4–10.8)
WBC: 6.1 10*3/uL (ref 4.0–10.5)
nRBC: 0 % (ref 0.0–0.2)

## 2020-10-25 LAB — COMPREHENSIVE METABOLIC PANEL
ALT: 9 IU/L (ref 0–32)
ALT: 13 U/L (ref 0–44)
AST: 15 IU/L (ref 0–40)
AST: 20 U/L (ref 15–41)
Albumin/Globulin Ratio: 1.5 (ref 1.2–2.2)
Albumin: 3.4 g/dL — ABNORMAL LOW (ref 3.9–5.0)
Albumin: 2.9 g/dL — ABNORMAL LOW (ref 3.5–5.0)
Alkaline Phosphatase: 152 IU/L — ABNORMAL HIGH (ref 42–106)
Alkaline Phosphatase: 124 U/L (ref 38–126)
Anion gap: 8 (ref 5–15)
BUN/Creatinine Ratio: 9 (ref 9–23)
BUN: 5 mg/dL — ABNORMAL LOW (ref 6–20)
BUN: 5 mg/dL — ABNORMAL LOW (ref 6–20)
Bilirubin Total: 0.2 mg/dL (ref 0.0–1.2)
CO2: 17 mmol/L — ABNORMAL LOW (ref 20–29)
CO2: 19 mmol/L — ABNORMAL LOW (ref 22–32)
Calcium: 8.8 mg/dL (ref 8.7–10.2)
Calcium: 9 mg/dL (ref 8.9–10.3)
Chloride: 109 mmol/L — ABNORMAL HIGH (ref 96–106)
Chloride: 109 mmol/L (ref 98–111)
Creatinine, Ser: 0.57 mg/dL (ref 0.57–1.00)
Creatinine, Ser: 0.57 mg/dL (ref 0.44–1.00)
GFR, Estimated: >60 mL/min (ref >60)
Globulin, Total: 2.2 g/dL (ref 1.5–4.5)
Glucose, Bld: 89 mg/dL (ref 70–99)
Glucose: 95 mg/dL (ref 65–99)
Potassium: 4.1 mmol/L (ref 3.5–5.2)
Potassium: 3.7 mmol/L (ref 3.5–5.1)
Sodium: 139 mmol/L (ref 134–144)
Sodium: 136 mmol/L (ref 135–145)
Total Bilirubin: 0.7 mg/dL (ref 0.3–1.2)
Total Protein: 5.6 g/dL — ABNORMAL LOW (ref 6.0–8.5)
Total Protein: 5.9 g/dL — ABNORMAL LOW (ref 6.5–8.1)
eGFR: 134 mL/min/{1.73_m2} (ref >59)

## 2020-10-25 LAB — PROTEIN / CREATININE RATIO, URINE
Creatinine, Urine: 175.1 mg/dL
Creatinine, Urine: 30.7 mg/dL
Protein, Ur: 29.8 mg/dL
Protein/Creat Ratio: 170 mg/g creat (ref 0–200)
Total Protein, Urine: <6 mg/dL

## 2020-10-25 LAB — URINALYSIS, ROUTINE W REFLEX MICROSCOPIC
Bilirubin Urine: NEGATIVE
Glucose, UA: NEGATIVE mg/dL
Hgb urine dipstick: NEGATIVE
Ketones, ur: NEGATIVE mg/dL
Leukocytes,Ua: NEGATIVE
Nitrite: NEGATIVE
Protein, ur: NEGATIVE mg/dL
Specific Gravity, Urine: 1.005 (ref 1.005–1.030)
pH: 7 (ref 5.0–8.0)

## 2020-10-25 MED ORDER — LACTATED RINGERS IV BOLUS (SEPSIS)
1000.0000 mL | Freq: Once | INTRAVENOUS | Status: AC
  Administered 2020-10-26: 1000 mL via INTRAVENOUS

## 2020-10-25 MED ORDER — CYCLOBENZAPRINE HCL 5 MG PO TABS
10.0000 mg | ORAL_TABLET | Freq: Once | ORAL | Status: AC
  Administered 2020-10-25: 10 mg via ORAL
  Filled 2020-10-25: qty 2

## 2020-10-25 MED ORDER — LABETALOL HCL 100 MG PO TABS
200.0000 mg | ORAL_TABLET | Freq: Once | ORAL | Status: AC
  Administered 2020-10-26: 200 mg via ORAL
  Filled 2020-10-25: qty 2

## 2020-10-25 MED ORDER — METOCLOPRAMIDE HCL 5 MG/ML IJ SOLN
10.0000 mg | Freq: Once | INTRAMUSCULAR | Status: AC
  Administered 2020-10-26: 10 mg via INTRAVENOUS
  Filled 2020-10-25: qty 2

## 2020-10-25 MED ORDER — DIPHENHYDRAMINE HCL 50 MG/ML IJ SOLN
25.0000 mg | Freq: Once | INTRAMUSCULAR | Status: AC
  Administered 2020-10-26: 25 mg via INTRAVENOUS
  Filled 2020-10-25: qty 1

## 2020-10-25 MED ORDER — ACETAMINOPHEN 500 MG PO TABS
1000.0000 mg | ORAL_TABLET | Freq: Once | ORAL | Status: AC
  Administered 2020-10-25: 1000 mg via ORAL
  Filled 2020-10-25: qty 2

## 2020-10-25 MED ORDER — DEXAMETHASONE SODIUM PHOSPHATE 10 MG/ML IJ SOLN
10.0000 mg | Freq: Once | INTRAMUSCULAR | Status: AC
  Administered 2020-10-26: 10 mg via INTRAVENOUS
  Filled 2020-10-25: qty 1

## 2020-10-25 NOTE — MAU Note (Signed)
Alexis [name] is a 20 y.o. at [redacted]w[redacted]d here in MAU reporting: increased BP for the past week. Has been checking BP at home. Today it was 142/92 and reports increased swelling. Currently has a mild headache and has blurry vision. +FM  Onset of complaint: ongoing  Pain score: 7/10  Vitals:   10/25/20 1807  BP: (!) 149/83  Pulse: 100  Resp: 16  Temp: 99.2 F (37.3 C)  SpO2: 100%     FHT: +FM  Lab orders placed from triage: UA

## 2020-10-25 NOTE — MAU Provider Note (Addendum)
History     CSN: [id number]  Arrival date and time: 10/25/20 1737   Event Date/Time   First Provider Initiated Contact with Patient 10/25/20 2008      Chief Complaint  Patient presents with  . Hypertension   [name] is a 20 y.o. G1P0000 at [redacted]w[redacted]d who presents to MAU for preeclampsia evaluation after she took her BP at home and found it was elevated. Patient was recently started on labetalol  BID for cHTN and reports she took her dose this morning, but is not due for her evening dose until 10PM tonight. Patient also reports 7/10 HA and denies previous HA diagnosis. Patient reports she took a single extra strength Tylenol about 5 hours ago, which did take the headache away. Patient also reports intermittent blurry vision and bilateral pedal edema that have been on-going for several weeks. Patient denies blurry vision at this time, but reports is does usually accompany a headache.  Pt denies blurry vision/seeing spots, N/V, epigastric pain, swelling in face and hands, sudden weight gain. Pt denies chest pain and SOB.  Pt denies constipation, diarrhea, or urinary problems. Pt denies fever, chills, fatigue, sweating or changes in appetite. Pt denies dizziness, light-headedness, weakness.  Pt denies VB, ctx, LOF and reports good FM.  Current pregnancy problems? Di-di twins, cHTN Blood Type? O Positive Allergies? Venofer Current medications? Pantoprazole, iron, Diclegis, zyrtec Current PNC & next appt? [facility]   OB History    Gravida  1   Para  0   Term  0   Preterm  0   AB  0   Living  0     SAB  0   IAB  0   Ectopic  0   Multiple  0   Live Births  0           Past Medical History:  Diagnosis Date  . Anxiety   . COVID-19   . Depression   . GERD (gastroesophageal reflux disease)   . Tachycardia    no medications-intermittent    Past Surgical History:  Procedure Laterality Date  . NO PAST SURGERIES      Family History  Problem  Relation Age of Onset  . Diabetes Mellitus II Mother   . Hypertension Mother   . Hypertension Father   . Stomach cancer Maternal Aunt   . Stomach cancer Paternal Grandmother     Social History   Tobacco Use  . Smoking status: Never Smoker  . Smokeless tobacco: Never Used  Vaping Use  . Vaping Use: Never used  Substance Use Topics  . Alcohol use: Not Currently  . Drug use: Not Currently    Allergies:  Allergies  Allergen Reactions  . Dust Mite Extract Hives  . Mixed Ragweed Hives  . Venofer [Iron Sucrose] Other (See Comments)    Chest pain    No medications prior to admission.    Review of Systems  Constitutional: Negative for chills, diaphoresis, fatigue and fever.  Eyes: Negative for visual disturbance.  Respiratory: Negative for shortness of breath.   Cardiovascular: Negative for chest pain.  Gastrointestinal: Negative for abdominal pain, constipation, diarrhea, nausea and vomiting.  Genitourinary: Negative for dysuria, flank pain, frequency, pelvic pain, urgency, vaginal bleeding and vaginal discharge.  Musculoskeletal:       Pedal edema  Neurological: Positive for headaches. Negative for dizziness, weakness and light-headedness.   Physical Exam   Blood pressure (!) 141/88, pulse 85, temperature 99.2 F (37.3 C), resp. rate 16,  height 5\' 5"  (1.651 m), weight 92.5 kg, last menstrual period 02/22/2020, SpO2 99 %.  Patient Vitals for the past 24 hrs:  BP Temp Pulse Resp SpO2 Height Weight  10/26/20 0110 -- -- -- -- 99 % -- --  10/26/20 0105 -- -- -- -- 99 % -- --  10/26/20 0101 (!) 141/88 -- 85 -- -- -- --  10/26/20 0100 -- -- -- -- 100 % -- --  10/26/20 0001 136/82 -- 82 -- -- -- --  10/25/20 2346 (!) 143/89 -- 82 -- -- -- --  10/25/20 2340 -- -- -- -- 99 % -- --  10/25/20 2335 -- -- -- -- 98 % -- --  10/25/20 2331 (!) 131/92 -- 87 -- -- -- --  10/25/20 2330 -- -- -- -- 98 % -- --  10/25/20 2325 -- -- -- -- 99 % -- --  10/25/20 2320 -- -- -- -- 99 % -- --   10/25/20 2316 136/79 -- 81 -- 99 % -- --  10/25/20 2315 -- -- -- -- 99 % -- --  10/25/20 2310 -- -- -- -- 99 % -- --  10/25/20 2305 -- -- -- -- 100 % -- --  10/25/20 2301 138/81 -- 87 -- -- -- --  10/25/20 2300 -- -- -- -- 99 % -- --  10/25/20 2255 -- -- -- -- 99 % -- --  10/25/20 2250 -- -- -- -- 99 % -- --  10/25/20 2246 138/87 -- 85 -- -- -- --  10/25/20 2245 -- -- -- -- 99 % -- --  10/25/20 2240 -- -- -- -- 99 % -- --  10/25/20 2235 -- -- -- -- 99 % -- --  10/25/20 2231 139/82 -- 94 -- -- -- --  10/25/20 2230 -- -- -- -- 99 % -- --  10/25/20 2225 -- -- -- -- 99 % -- --  10/25/20 2220 -- -- -- -- 100 % -- --  10/25/20 2216 (!) 145/84 -- 94 -- -- -- --  10/25/20 2215 -- -- -- -- 100 % -- --  10/25/20 2210 -- -- -- -- 100 % -- --  10/25/20 2205 -- -- -- -- 100 % -- --  10/25/20 2201 (!) 145/93 -- 99 -- -- -- --  10/25/20 2200 -- -- -- -- 100 % -- --  10/25/20 2155 -- -- -- -- 100 % -- --  10/25/20 2150 -- -- -- -- 100 % -- --  10/25/20 2146 136/82 -- 88 -- -- -- --  10/25/20 2145 -- -- -- -- 100 % -- --  10/25/20 2140 -- -- -- -- 100 % -- --  10/25/20 2135 -- -- -- -- 99 % -- --  10/25/20 2131 136/83 -- 91 -- -- -- --  10/25/20 2130 -- -- -- -- 98 % -- --  10/25/20 2125 -- -- -- -- 99 % -- --  10/25/20 2120 -- -- -- -- 99 % -- --  10/25/20 2116 140/78 -- 92 -- -- -- --  10/25/20 2115 -- -- -- -- 99 % -- --  10/25/20 2110 -- -- -- -- 99 % -- --  10/25/20 2105 -- -- -- -- 99 % -- --  10/25/20 2101 (!) 146/81 -- 96 -- -- -- --  10/25/20 2100 -- -- -- -- 99 % -- --  10/25/20 2055 -- -- -- -- 99 % -- --  10/25/20 2050 -- -- -- -- 99 % -- --  10/25/20 2046 2047)  143/90 -- 97 -- -- -- --  10/25/20 2045 -- -- -- -- 98 % -- --  10/25/20 2040 -- -- -- -- 99 % -- --  10/25/20 2035 -- -- -- -- 97 % -- --  10/25/20 2031 (!) 142/76 -- 95 -- -- -- --  10/25/20 2030 -- -- -- -- 99 % -- --  10/25/20 2025 -- -- -- -- 98 % -- --  10/25/20 2020 -- -- -- -- 99 % -- --  10/25/20 2016 (!)  143/90 -- 92 -- -- -- --  10/25/20 2015 -- -- -- -- 100 % -- --  10/25/20 2010 -- -- -- -- 100 % -- --  10/25/20 2005 -- -- -- -- 100 % -- --  10/25/20 2001 (!) 143/84 -- 92 -- -- -- --  10/25/20 2000 -- -- -- -- 100 % -- --  10/25/20 1955 -- -- -- -- 100 % -- --  10/25/20 1950 -- -- -- -- 100 % -- --  10/25/20 1946 140/84 -- 100 -- -- -- --  10/25/20 1945 -- -- -- -- 100 % -- --  10/25/20 1940 -- -- -- -- 100 % -- --  10/25/20 1935 -- -- -- -- 100 % -- --  10/25/20 1931 (!) 144/84 -- (!) 109 -- -- -- --  10/25/20 1930 -- -- -- -- 100 % -- --  10/25/20 1925 -- -- -- -- 100 % -- --  10/25/20 1920 -- -- -- -- 99 % -- --  10/25/20 1916 135/80 -- (!) 107 -- -- -- --  10/25/20 1915 -- -- -- -- 99 % -- --  10/25/20 1910 -- -- -- -- 99 % -- --  10/25/20 1905 -- -- -- -- 99 % -- --  10/25/20 1901 (!) 149/86 -- 97 -- -- -- --  10/25/20 1900 -- -- -- -- 99 % -- --  10/25/20 1855 -- -- -- -- 99 % -- --  10/25/20 1850 -- -- -- -- 99 % -- --  10/25/20 1846 (!) 143/86 -- (!) 106 -- -- -- --  10/25/20 1845 -- -- -- -- 99 % -- --  10/25/20 1840 -- -- -- -- 98 % -- --  10/25/20 1836 (!) 149/86 -- 99 -- -- -- --  10/25/20 1828 (!) 145/82 -- (!) 101 -- -- -- --  10/25/20 1807 (!) 149/83 99.2 F (37.3 C) 100 16 100 % -- --  10/25/20 1802 -- -- -- -- -- 5\' 5"  (1.651 m) 92.5 kg   Physical Exam Vitals and nursing note reviewed.  Constitutional:      General: She is not in acute distress.    Appearance: Normal appearance. She is not ill-appearing, toxic-appearing or diaphoretic.  HENT:     Head: Normocephalic and atraumatic.  Pulmonary:     Effort: Pulmonary effort is normal.  Neurological:     Mental Status: She is alert and oriented to person, place, and time.  Psychiatric:        Mood and Affect: Mood normal.        Behavior: Behavior normal.        Thought Content: Thought content normal.        Judgment: Judgment normal.    Results for orders placed or performed during the hospital  encounter of 10/25/20 (from the past 24 hour(s))  Urinalysis, Routine w reflex microscopic Urine, Clean Catch     Status: Abnormal   Collection Time: 10/25/20  6:45 PM  Result Value Ref Range   Color, Urine STRAW (A) YELLOW   APPearance CLEAR CLEAR   Specific Gravity, Urine 1.005 1.005 - 1.030   pH 7.0 5.0 - 8.0   Glucose, UA NEGATIVE NEGATIVE mg/dL   Hgb urine dipstick NEGATIVE NEGATIVE   Bilirubin Urine NEGATIVE NEGATIVE   Ketones, ur NEGATIVE NEGATIVE mg/dL   Protein, ur NEGATIVE NEGATIVE mg/dL   Nitrite NEGATIVE NEGATIVE   Leukocytes,Ua NEGATIVE NEGATIVE  Protein / creatinine ratio, urine     Status: None   Collection Time: 10/25/20  7:12 PM  Result Value Ref Range   Creatinine, Urine 30.70 mg/dL   Total Protein, Urine <6 mg/dL   Protein Creatinine Ratio        0.00 - 0.15 mg/mg[Cre]  CBC     Status: Abnormal   Collection Time: 10/25/20  7:25 PM  Result Value Ref Range   WBC 6.1 4.0 - 10.5 K/uL   RBC 3.19 (L) 3.87 - 5.11 MIL/uL   Hemoglobin 10.2 (L) 12.0 - 15.0 g/dL   HCT 16.131.5 (L) 09.636.0 - 04.546.0 %   MCV 98.7 80.0 - 100.0 fL   MCH 32.0 26.0 - 34.0 pg   MCHC 32.4 30.0 - 36.0 g/dL   RDW 40.914.2 81.111.5 - 91.415.5 %   Platelets 192 150 - 400 K/uL   nRBC 0.0 0.0 - 0.2 %  Comprehensive metabolic panel     Status: Abnormal   Collection Time: 10/25/20  7:25 PM  Result Value Ref Range   Sodium 136 135 - 145 mmol/L   Potassium 3.7 3.5 - 5.1 mmol/L   Chloride 109 98 - 111 mmol/L   CO2 19 (L) 22 - 32 mmol/L   Glucose, Bld 89 70 - 99 mg/dL   BUN 5 (L) 6 - 20 mg/dL   Creatinine, Ser 7.820.57 0.44 - 1.00 mg/dL   Calcium 9.0 8.9 - 95.610.3 mg/dL   Total Protein 5.9 (L) 6.5 - 8.1 g/dL   Albumin 2.9 (L) 3.5 - 5.0 g/dL   AST 20 15 - 41 U/L   ALT 13 0 - 44 U/L   Alkaline Phosphatase 124 38 - 126 U/L   Total Bilirubin 0.7 0.3 - 1.2 mg/dL   GFR, Estimated >21>60 >30>60 mL/min   Anion gap 8 5 - 15   Baby A: FHR baseline 145, moderate variability, positive accelerations Baby B: FHR baseline 145, moderate  variability, positive accelerations Toco with irregular contractions, mild to palpation MAU Course  Procedures  MDM -preeclampsia evaluation without severe range BP in MAU on admission -elevated BP of 140s/80s -symptoms include: HA; 1000mg  Tylenol, 10mg  Flexeril given -UA: straw, otherwise WNL -CBC: H/H 10.2/31.5, platelets 192 -CMP: serum creatinine 0.57, AST/ALT 20/13 -PCr: below reportable range -EFM (Twin A): reactive, tachycardiac       -baseline: originally 130, now 160/180       -variability: moderate       -accels: present, 15x15       -decels: absent       -TOCO: irritability -EFM: reactive, tachycardia       -baseline: originally 140, now 180       -variability: moderate       -accels: present, 15x15       -decels: absent       -TOCO: irritability -after medication administration, pt reports HA now 4/10 -consulted with Dr. Vergie LivingPickens, pt OK to be discharged home, continue labetalol 200mg  BID and keep appt on 10/31/2020, does not need to be seen sooner -upon getting  ready to discharge patient, fetal tachycardia noted in both babies for about 20-32min, will have patient drink water (IV fluids deferred d/t swelling) and will get dose of labetalol here in hospital while extended monitoring takes place -care transferred to L. Leftwich-Kirby, CNM Nugent, Odie Sera, NP  10:24 PM 10/25/2020  Orders Placed This Encounter  Procedures  . Urinalysis, Routine w reflex microscopic Urine, Clean Catch    Standing Status:   Standing    Number of Occurrences:   1  . CBC    Standing Status:   Standing    Number of Occurrences:   1  . Comprehensive metabolic panel    Standing Status:   Standing    Number of Occurrences:   1  . Protein / creatinine ratio, urine    Standing Status:   Standing    Number of Occurrences:   1  . Discharge patient    Order Specific Question:   Discharge disposition    Answer:   01-Home or Self Care [1]    Order Specific Question:   Discharge patient date     Answer:   10/26/2020   Meds ordered this encounter  Medications  . acetaminophen (TYLENOL) tablet 1,000 mg  . cyclobenzaprine (FLEXERIL) tablet 10 mg  . labetalol (NORMODYNE) tablet 200 mg  . FOLLOWED BY Linked Order Group   . lactated ringers bolus 1,000 mL   . diphenhydrAMINE (BENADRYL) injection 25 mg   . metoCLOPramide (REGLAN) injection 10 mg   . dexamethasone (DECADRON) injection 10 mg   MDM:  Pt h/a returned to 6/10, fetal tachycardia x 2 noted.  Pt given PO fluids to drink which resolved fetal tachycardia.  She still reported h/a, however so headache cocktail with LR, decadron, Benadryl,and Reglan given with significant improvement of h/a to 2-3/10. Fetal tachycardia resolved before IV fluids with PO fluids and NST remained reactive.  Given significant improvement in h/a, no other s/sx of PEC, and normal PEC labs, pt discharged home with close outpatient follow up for [facility]. Keep appts in office, return to MAU for signs of labor or emergencies.   Assessment and Plan   1. Chronic hypertension complicating or reason for care during pregnancy, third trimester   2. Chronic hypertension   3. Dichorionic diamniotic twin pregnancy in third trimester   4. [redacted] weeks gestation of pregnancy   5. Headache in pregnancy, antepartum, third trimester     D/C home with PEC precautions   Sharen Counter, CNM 4:42 AM

## 2020-10-26 DIAGNOSIS — Z3A35 35 weeks gestation of pregnancy: Secondary | ICD-10-CM | POA: Diagnosis not present

## 2020-10-26 DIAGNOSIS — O10013 Pre-existing essential hypertension complicating pregnancy, third trimester: Secondary | ICD-10-CM | POA: Diagnosis not present

## 2020-10-26 DIAGNOSIS — O30043 Twin pregnancy, dichorionic/diamniotic, third trimester: Secondary | ICD-10-CM | POA: Diagnosis not present

## 2020-10-26 LAB — GC/CHLAMYDIA PROBE AMP (~~LOC~~) NOT AT ARMC
Chlamydia: NEGATIVE
Comment: NEGATIVE
Comment: NORMAL
Neisseria Gonorrhea: NEGATIVE

## 2020-10-27 ENCOUNTER — Other Ambulatory Visit: Payer: Self-pay

## 2020-10-27 ENCOUNTER — Encounter (HOSPITAL_COMMUNITY): Payer: Self-pay | Admitting: Obstetrics & Gynecology

## 2020-10-27 ENCOUNTER — Inpatient Hospital Stay (HOSPITAL_COMMUNITY)
Admission: AD | Admit: 2020-10-27 | Discharge: 2020-10-27 | Disposition: A | Discharge status: Home / Self Care | Payer: Medicaid Other | Attending: Obstetrics & Gynecology | Admitting: Obstetrics & Gynecology

## 2020-10-27 DIAGNOSIS — Z79899 Other long term (current) drug therapy: Secondary | ICD-10-CM | POA: Diagnosis not present

## 2020-10-27 DIAGNOSIS — Z8616 Personal history of COVID-19: Secondary | ICD-10-CM | POA: Insufficient documentation

## 2020-10-27 DIAGNOSIS — Z3A35 35 weeks gestation of pregnancy: Secondary | ICD-10-CM | POA: Diagnosis not present

## 2020-10-27 DIAGNOSIS — O10919 Unspecified pre-existing hypertension complicating pregnancy, unspecified trimester: Secondary | ICD-10-CM

## 2020-10-27 DIAGNOSIS — O26893 Other specified pregnancy related conditions, third trimester: Secondary | ICD-10-CM | POA: Diagnosis not present

## 2020-10-27 DIAGNOSIS — O10913 Unspecified pre-existing hypertension complicating pregnancy, third trimester: Secondary | ICD-10-CM

## 2020-10-27 DIAGNOSIS — Z7982 Long term (current) use of aspirin: Secondary | ICD-10-CM | POA: Insufficient documentation

## 2020-10-27 DIAGNOSIS — R519 Headache, unspecified: Secondary | ICD-10-CM

## 2020-10-27 DIAGNOSIS — O30043 Twin pregnancy, dichorionic/diamniotic, third trimester: Secondary | ICD-10-CM | POA: Diagnosis not present

## 2020-10-27 LAB — CBC
HCT: 32.1 % — ABNORMAL LOW (ref 36.0–46.0)
Hemoglobin: 10.1 g/dL — ABNORMAL LOW (ref 12.0–15.0)
MCH: 31.7 pg (ref 26.0–34.0)
MCHC: 31.5 g/dL (ref 30.0–36.0)
MCV: 100.6 fL — ABNORMAL HIGH (ref 80.0–100.0)
Platelets: 198 10*3/uL (ref 150–400)
RBC: 3.19 MIL/uL — ABNORMAL LOW (ref 3.87–5.11)
RDW: 14.5 % (ref 11.5–15.5)
WBC: 6.6 10*3/uL (ref 4.0–10.5)
nRBC: 0.3 % — ABNORMAL HIGH (ref 0.0–0.2)

## 2020-10-27 LAB — COMPREHENSIVE METABOLIC PANEL
ALT: 14 U/L (ref 0–44)
AST: 27 U/L (ref 15–41)
Albumin: 2.9 g/dL — ABNORMAL LOW (ref 3.5–5.0)
Alkaline Phosphatase: 125 U/L (ref 38–126)
Anion gap: 6 (ref 5–15)
BUN: 5 mg/dL — ABNORMAL LOW (ref 6–20)
CO2: 20 mmol/L — ABNORMAL LOW (ref 22–32)
Calcium: 8.6 mg/dL — ABNORMAL LOW (ref 8.9–10.3)
Chloride: 111 mmol/L (ref 98–111)
Creatinine, Ser: 0.68 mg/dL (ref 0.44–1.00)
GFR, Estimated: >60 mL/min (ref >60)
Glucose, Bld: 94 mg/dL (ref 70–99)
Potassium: 3.9 mmol/L (ref 3.5–5.1)
Sodium: 137 mmol/L (ref 135–145)
Total Bilirubin: 0.4 mg/dL (ref 0.3–1.2)
Total Protein: 6 g/dL — ABNORMAL LOW (ref 6.5–8.1)

## 2020-10-27 LAB — PROTEIN / CREATININE RATIO, URINE
Creatinine, Urine: 55.93 mg/dL
Total Protein, Urine: <6 mg/dL

## 2020-10-27 MED ORDER — METOCLOPRAMIDE HCL 5 MG/ML IJ SOLN
10.0000 mg | Freq: Once | INTRAMUSCULAR | Status: AC
  Administered 2020-10-27: 10 mg via INTRAVENOUS
  Filled 2020-10-27: qty 2

## 2020-10-27 MED ORDER — LABETALOL HCL 100 MG PO TABS
200.0000 mg | ORAL_TABLET | Freq: Once | ORAL | Status: AC
  Administered 2020-10-27: 200 mg via ORAL
  Filled 2020-10-27: qty 2

## 2020-10-27 MED ORDER — LACTATED RINGERS IV BOLUS
1000.0000 mL | Freq: Once | INTRAVENOUS | Status: AC
  Administered 2020-10-27: 1000 mL via INTRAVENOUS

## 2020-10-27 MED ORDER — DEXAMETHASONE SODIUM PHOSPHATE 10 MG/ML IJ SOLN
10.0000 mg | Freq: Once | INTRAMUSCULAR | Status: AC
  Administered 2020-10-27: 10 mg via INTRAVENOUS
  Filled 2020-10-27: qty 1

## 2020-10-27 MED ORDER — DIPHENHYDRAMINE HCL 50 MG/ML IJ SOLN
25.0000 mg | Freq: Once | INTRAMUSCULAR | Status: AC
  Administered 2020-10-27: 25 mg via INTRAVENOUS
  Filled 2020-10-27: qty 1

## 2020-10-27 NOTE — MAU Note (Signed)
Pt reports to mau with c/o elevated BP at home X 2.  Pt states she has also had a headache for most of the day that was not relieved by tylenol.  Pt also reports blurred vision for the past 2 weeks.  Denies reg ctx or LOF.  +FM

## 2020-10-27 NOTE — MAU Provider Note (Signed)
History     CSN: [id number]  Arrival date and time: 10/27/20 1627   Event Date/Time   First Provider Initiated Contact with Patient 10/27/20 1758      Chief Complaint  Patient presents with  . Headache  . Hypertension  . Blurred Vision   Alexis [name] is a 20 y.o. year old 301P0000 female at 6684w3d weeks gestation who presents to MAU reporting 2 elevated BPs at home (155/103 this AM and 157/103 this after noon), H/A for "most of the day" not relieved with Tylenol (last taken at 1100, blurry vision x 2 wks. She states, "I told my OB about it and he told me to just keep an eye on it." She also reports dizziness and nausea as well. She reports she drank 3.5 bottles of water today. She was recently seen in MAU for similar complaints and received the H/A protocol. She is interested in getting that protocol again, if she can. [facility]. Her pregnancy is complicated by Di-Di twins and cHTN. She takes Labetalol 200 mg BID; last dose was with breakfast today.   OB History    Gravida  1   Para  0   Term  0   Preterm  0   AB  0   Living  0     SAB  0   IAB  0   Ectopic  0   Multiple  0   Live Births  0           Past Medical History:  Diagnosis Date  . Anxiety   . COVID-19   . Depression   . GERD (gastroesophageal reflux disease)   . Tachycardia    no medications-intermittent    Past Surgical History:  Procedure Laterality Date  . NO PAST SURGERIES      Family History  Problem Relation Age of Onset  . Diabetes Mellitus II Mother   . Hypertension Mother   . Hypertension Father   . Stomach cancer Maternal Aunt   . Stomach cancer Paternal Grandmother     Social History   Tobacco Use  . Smoking status: Never Smoker  . Smokeless tobacco: Never Used  Vaping Use  . Vaping Use: Never used  Substance Use Topics  . Alcohol use: Not Currently  . Drug use: Not Currently    Allergies:  Allergies  Allergen Reactions  . Dust Mite  Extract Hives  . Mixed Ragweed Hives  . Venofer [Iron Sucrose] Other (See Comments)    Chest pain    Medications Prior to Admission  Medication Sig Dispense Refill Last Dose  . acetaminophen (TYLENOL) 500 MG tablet Take 500 mg by mouth every 6 (six) hours as needed.   10/27/2020 at Unknown time  . aspirin EC 81 MG tablet Take 81 mg by mouth daily. Swallow whole.   10/27/2020 at Unknown time  . cetirizine (ZYRTEC) 10 MG tablet Take 10 mg by mouth daily.   10/26/2020 at Unknown time  . Doxylamine-Pyridoxine (DICLEGIS) 10-10 MG TBEC Take 2 tablets by mouth at bedtime. If symptoms persist, add one tablet in the morning and one in the afternoon 100 tablet 5 10/26/2020 at Unknown time  . famotidine (PEPCID) 20 MG tablet Take 1 tablet (20 mg total) by mouth 2 (two) times daily. 60 tablet 3 Past Week at Unknown time  . Ferrous Sulfate Dried (HIGH POTENCY IRON) 65 MG TABS Take by mouth.   10/26/2020 at Unknown time  . fluticasone (FLONASE) 50  MCG/ACT nasal spray Place 1 spray into both nostrils in the morning and at bedtime. 9.9 mL 2 10/26/2020 at Unknown time  . labetalol (NORMODYNE) 200 MG tablet Take 1 tablet (200 mg total) by mouth 2 (two) times daily. 60 tablet 3 10/27/2020 at Unknown time  . pantoprazole (PROTONIX) 40 MG tablet Take 1 tablet (40 mg total) by mouth daily. 30 tablet 1 10/27/2020 at Unknown time  . Prenatal Vit-Fe Fumarate-FA (PRENATAL VITAMINS PO) Take 1 tablet by mouth daily.   10/26/2020 at Unknown time  . amoxicillin-clavulanate (AUGMENTIN) 875-125 MG tablet Take 1 tablet by mouth 2 (two) times daily. 20 tablet 1   . calcium carbonate (TUMS - DOSED IN MG ELEMENTAL CALCIUM) 500 MG chewable tablet Chew 1 tablet by mouth daily.     . cyclobenzaprine (FLEXERIL) 10 MG tablet Take 1 tablet (10 mg total) by mouth 3 (three) times daily as needed for muscle spasms. (Patient not taking: No sig reported) 30 tablet 0   . metoCLOPramide (REGLAN) 10 MG tablet Take 1 tablet (10 mg total) by mouth every 8  (eight) hours as needed (take with tylenol for headache). (Patient not taking: No sig reported) 30 tablet 0   . ondansetron (ZOFRAN-ODT) 8 MG disintegrating tablet Take 1 tablet (8 mg total) by mouth every 8 (eight) hours as needed for nausea or vomiting. (Patient not taking: No sig reported) 20 tablet 0   . oxyCODONE-acetaminophen (PERCOCET) 5-325 MG tablet Take 1 tablet by mouth every 6 (six) hours as needed for severe pain. (Patient not taking: Reported on 10/24/2020) 5 tablet 0   . sertraline (ZOLOFT) 50 MG tablet TAKE 1 TABLET BY MOUTH EVERY DAY (Patient not taking: No sig reported) 90 tablet 2     Review of Systems  Constitutional: Positive for fatigue.  HENT: Negative.   Eyes: Positive for visual disturbance (blurry vision x 2 wks; "mentioned to my OB and he said just to watch it").  Respiratory: Negative.   Cardiovascular: Negative.   Gastrointestinal: Positive for nausea.  Endocrine: Negative.   Genitourinary: Negative.   Musculoskeletal: Negative.   Skin: Negative.   Allergic/Immunologic: Negative.   Neurological: Positive for dizziness and headaches (9/10, bilateral temporal H/A most of the day).       Some tingling in fingers after starting Labetalol  Hematological: Negative.   Psychiatric/Behavioral: Negative.    Physical Exam   Patient Vitals for the past 24 hrs:  BP Temp Temp src Pulse Resp SpO2  10/27/20 2101 (!) 134/99 -- -- 90 -- --  10/27/20 2046 138/90 -- -- 89 -- --  10/27/20 2031 (!) 140/91 -- -- 92 -- --  10/27/20 2010 -- -- -- -- -- 100 %  10/27/20 2005 -- -- -- -- -- 100 %  10/27/20 2001 (!) 148/78 -- -- 85 -- --  10/27/20 2000 -- -- -- -- -- 100 %  10/27/20 1955 -- -- -- -- -- 99 %  10/27/20 1950 -- -- -- -- -- 100 %  10/27/20 1946 133/87 -- -- 87 -- --  10/27/20 1945 -- -- -- -- -- 100 %  10/27/20 1940 -- -- -- -- -- 99 %  10/27/20 1935 -- -- -- -- -- 100 %  10/27/20 1931 (!) 135/96 -- -- 82 -- --  10/27/20 1930 -- -- -- -- -- 100 %  10/27/20 1925  -- -- -- -- -- 100 %  10/27/20 1920 -- -- -- -- -- 100 %  10/27/20 1916 (!) 114/96 -- --  93 -- --  10/27/20 1915 -- -- -- -- -- 100 %  10/27/20 1910 -- -- -- -- -- 100 %  10/27/20 1905 -- -- -- -- -- 100 %  10/27/20 1901 (!) 147/92 -- -- 88 -- --  10/27/20 1900 -- -- -- -- -- 99 %  10/27/20 1855 -- -- -- -- -- 100 %  10/27/20 1850 -- -- -- -- -- 100 %  10/27/20 1846 (!) 150/90 -- -- 91 -- --  10/27/20 1845 -- -- -- -- -- 100 %  10/27/20 1840 -- -- -- -- -- 100 %  10/27/20 1836 -- -- -- -- -- 99 %  10/27/20 1835 -- -- -- -- -- 99 %  10/27/20 1831 (!) 144/83 -- -- 90 -- --  10/27/20 1830 -- -- -- -- -- 99 %  10/27/20 1825 -- -- -- -- -- 99 %  10/27/20 1820 -- -- -- -- -- 98 %  10/27/20 1816 (!) 144/84 -- -- 92 -- 99 %  10/27/20 1810 -- -- -- -- -- 99 %  10/27/20 1805 -- -- -- -- -- 99 %  10/27/20 1801 132/64 -- -- 99 -- --  10/27/20 1800 -- -- -- -- -- 99 %  10/27/20 1746 (!) 147/81 -- -- 93 -- --  10/27/20 1735 -- -- -- -- -- 99 %  10/27/20 1731 (!) 147/82 -- -- 96 -- --  10/27/20 1730 -- -- -- -- -- 100 %  10/27/20 1725 -- -- -- -- -- 100 %  10/27/20 1720 -- -- -- -- -- 100 %  10/27/20 1716 (!) 141/85 -- -- 93 -- --  10/27/20 1715 -- -- -- -- -- 99 %  10/27/20 1714 (!) 142/83 -- -- 95 -- --  10/27/20 1645 (!) 144/92 98.5 F (36.9 C) Oral 98 15 99 %    Physical Exam Vitals and nursing note reviewed.  Constitutional:      Appearance: Normal appearance. She is normal weight.  HENT:     Head: Normocephalic and atraumatic.  Cardiovascular:     Rate and Rhythm: Normal rate.     Pulses: Normal pulses.  Pulmonary:     Effort: Pulmonary effort is normal.  Abdominal:     Palpations: Abdomen is soft.  Genitourinary:    Comments: Not indicated Musculoskeletal:        General: Normal range of motion.     Cervical back: Normal range of motion.  Skin:    General: Skin is warm and dry.  Neurological:     General: No focal deficit present.     Mental Status: She is alert and  oriented to person, place, and time.  Psychiatric:        Mood and Affect: Mood normal.        Behavior: Behavior normal.        Thought Content: Thought content normal.        Judgment: Judgment normal.    REACTIVE NST - FHR(A): 130 bpm / moderate variability / accels present / decels absent / FHR (B): 140 bpm / moderate variability / accels present / decels absent /TOCO: irregular UCs  MAU Course  Procedures  MDM CCUA CBC CMP P/C Ratio Serial BP's  Labetalol 200 mg po Received migraine headache cocktail (LR 100 ml bolus with benadryl 25 mg IVP, metoclopramide 10 mg IVP, Decadron 10 mg IVP) -- reports relief; pain rated 4/10 down from 9/10  Results for orders placed or performed during the hospital  encounter of 10/27/20 (from the past 24 hour(s))  CBC     Status: Abnormal   Collection Time: 10/27/20  4:58 PM  Result Value Ref Range   WBC 6.6 4.0 - 10.5 K/uL   RBC 3.19 (L) 3.87 - 5.11 MIL/uL   Hemoglobin 10.1 (L) 12.0 - 15.0 g/dL   HCT 99.3 (L) 71.6 - 96.7 %   MCV 100.6 (H) 80.0 - 100.0 fL   MCH 31.7 26.0 - 34.0 pg   MCHC 31.5 30.0 - 36.0 g/dL   RDW 89.3 81.0 - 17.5 %   Platelets 198 150 - 400 K/uL   nRBC 0.3 (H) 0.0 - 0.2 %  Comprehensive metabolic panel     Status: Abnormal   Collection Time: 10/27/20  4:58 PM  Result Value Ref Range   Sodium 137 135 - 145 mmol/L   Potassium 3.9 3.5 - 5.1 mmol/L   Chloride 111 98 - 111 mmol/L   CO2 20 (L) 22 - 32 mmol/L   Glucose, Bld 94 70 - 99 mg/dL   BUN 5 (L) 6 - 20 mg/dL   Creatinine, Ser 1.02 0.44 - 1.00 mg/dL   Calcium 8.6 (L) 8.9 - 10.3 mg/dL   Total Protein 6.0 (L) 6.5 - 8.1 g/dL   Albumin 2.9 (L) 3.5 - 5.0 g/dL   AST 27 15 - 41 U/L   ALT 14 0 - 44 U/L   Alkaline Phosphatase 125 38 - 126 U/L   Total Bilirubin 0.4 0.3 - 1.2 mg/dL   GFR, Estimated >58 >52 mL/min   Anion gap 6 5 - 15  Protein / creatinine ratio, urine     Status: None   Collection Time: 10/27/20  8:26 PM  Result Value Ref Range   Creatinine, Urine  55.93 mg/dL   Total Protein, Urine <6 mg/dL   Protein Creatinine Ratio RESULT BELOW REPORTABLE RANGE,  UNABLE TO CALCULATE.       0.00 - 0.15 mg/mg[Cre]      Assessment and Plan  Headache in pregnancy, third trimester - Advised to take Flexeril and Tylenol for H/A pain relief.  - Advised to return to MAU, if H/A not relieved after taking those medications  Chronic hypertension in pregnancy - Continue Labetalol as previously prescribed  [redacted] weeks gestation of pregnancy  - Discharge home - Keep scheduled appt with [facility] on 10/31/20 - Patient verbalized an understanding of the plan of care and agrees.     Raelyn Mora, CNM 10/27/2020, 5:58 PM

## 2020-10-27 NOTE — Discharge Instructions (Signed)
Please return to MAU for blood pressures greater than or equal to 160/110. Continue to take Flexeril to help with headache pain. Return to MAU, if no relief after trying Flexeril and Tylenol.

## 2020-10-28 LAB — CULTURE, BETA STREP (GROUP B ONLY): Strep Gp B Culture: NEGATIVE

## 2020-10-31 ENCOUNTER — Ambulatory Visit: Payer: Medicaid Other

## 2020-10-31 ENCOUNTER — Encounter (HOSPITAL_COMMUNITY): Payer: Self-pay | Admitting: Obstetrics & Gynecology

## 2020-10-31 ENCOUNTER — Inpatient Hospital Stay (HOSPITAL_COMMUNITY)
Admission: AD | Admit: 2020-10-31 | Discharge: 2020-11-03 | DRG: 807 | Disposition: A | Discharge status: Home / Self Care | Payer: Medicaid Other | Attending: Obstetrics and Gynecology | Admitting: Obstetrics and Gynecology

## 2020-10-31 ENCOUNTER — Ambulatory Visit (INDEPENDENT_AMBULATORY_CARE_PROVIDER_SITE_OTHER): Payer: Medicaid Other | Admitting: Family Medicine

## 2020-10-31 ENCOUNTER — Other Ambulatory Visit: Payer: Self-pay

## 2020-10-31 ENCOUNTER — Encounter: Payer: Medicaid Other | Admitting: Family Medicine

## 2020-10-31 VITALS — BP 135/94 | HR 105 | Wt 203.0 lb

## 2020-10-31 DIAGNOSIS — Z20822 Contact with and (suspected) exposure to covid-19: Secondary | ICD-10-CM | POA: Diagnosis present

## 2020-10-31 DIAGNOSIS — K219 Gastro-esophageal reflux disease without esophagitis: Secondary | ICD-10-CM | POA: Diagnosis present

## 2020-10-31 DIAGNOSIS — F419 Anxiety disorder, unspecified: Secondary | ICD-10-CM | POA: Diagnosis present

## 2020-10-31 DIAGNOSIS — F32A Depression, unspecified: Secondary | ICD-10-CM | POA: Diagnosis present

## 2020-10-31 DIAGNOSIS — O1410 Severe pre-eclampsia, unspecified trimester: Secondary | ICD-10-CM

## 2020-10-31 DIAGNOSIS — O30041 Twin pregnancy, dichorionic/diamniotic, first trimester: Secondary | ICD-10-CM

## 2020-10-31 DIAGNOSIS — O1002 Pre-existing essential hypertension complicating childbirth: Secondary | ICD-10-CM | POA: Diagnosis present

## 2020-10-31 DIAGNOSIS — O0990 Supervision of high risk pregnancy, unspecified, unspecified trimester: Secondary | ICD-10-CM

## 2020-10-31 DIAGNOSIS — O99344 Other mental disorders complicating childbirth: Secondary | ICD-10-CM | POA: Diagnosis present

## 2020-10-31 DIAGNOSIS — I10 Essential (primary) hypertension: Secondary | ICD-10-CM

## 2020-10-31 DIAGNOSIS — O9962 Diseases of the digestive system complicating childbirth: Secondary | ICD-10-CM | POA: Diagnosis present

## 2020-10-31 DIAGNOSIS — U071 COVID-19: Secondary | ICD-10-CM

## 2020-10-31 DIAGNOSIS — O30043 Twin pregnancy, dichorionic/diamniotic, third trimester: Secondary | ICD-10-CM | POA: Diagnosis present

## 2020-10-31 DIAGNOSIS — Z3A36 36 weeks gestation of pregnancy: Secondary | ICD-10-CM

## 2020-10-31 DIAGNOSIS — O119 Pre-existing hypertension with pre-eclampsia, unspecified trimester: Secondary | ICD-10-CM

## 2020-10-31 DIAGNOSIS — O114 Pre-existing hypertension with pre-eclampsia, complicating childbirth: Principal | ICD-10-CM | POA: Diagnosis present

## 2020-10-31 DIAGNOSIS — O1092 Unspecified pre-existing hypertension complicating childbirth: Secondary | ICD-10-CM | POA: Diagnosis not present

## 2020-10-31 LAB — CBC
HCT: 33 % — ABNORMAL LOW (ref 36.0–46.0)
Hemoglobin: 10.5 g/dL — ABNORMAL LOW (ref 12.0–15.0)
MCH: 32 pg (ref 26.0–34.0)
MCHC: 31.8 g/dL (ref 30.0–36.0)
MCV: 100.6 fL — ABNORMAL HIGH (ref 80.0–100.0)
Platelets: 200 10*3/uL (ref 150–400)
RBC: 3.28 MIL/uL — ABNORMAL LOW (ref 3.87–5.11)
RDW: 14.5 % (ref 11.5–15.5)
WBC: 6.9 10*3/uL (ref 4.0–10.5)
nRBC: 0.4 % — ABNORMAL HIGH (ref 0.0–0.2)

## 2020-10-31 LAB — COMPREHENSIVE METABOLIC PANEL
ALT: 17 U/L (ref 0–44)
AST: 29 U/L (ref 15–41)
Albumin: 3 g/dL — ABNORMAL LOW (ref 3.5–5.0)
Alkaline Phosphatase: 140 U/L — ABNORMAL HIGH (ref 38–126)
Anion gap: 9 (ref 5–15)
BUN: 5 mg/dL — ABNORMAL LOW (ref 6–20)
CO2: 19 mmol/L — ABNORMAL LOW (ref 22–32)
Calcium: 8.5 mg/dL — ABNORMAL LOW (ref 8.9–10.3)
Chloride: 107 mmol/L (ref 98–111)
Creatinine, Ser: 0.6 mg/dL (ref 0.44–1.00)
GFR, Estimated: >60 mL/min (ref >60)
Glucose, Bld: 100 mg/dL — ABNORMAL HIGH (ref 70–99)
Potassium: 4.1 mmol/L (ref 3.5–5.1)
Sodium: 135 mmol/L (ref 135–145)
Total Bilirubin: 0.4 mg/dL (ref 0.3–1.2)
Total Protein: 5.9 g/dL — ABNORMAL LOW (ref 6.5–8.1)

## 2020-10-31 LAB — RESP PANEL BY RT-PCR (FLU A&B, COVID) ARPGX2
Influenza A by PCR: NEGATIVE
Influenza B by PCR: NEGATIVE
SARS Coronavirus 2 by RT PCR: NEGATIVE

## 2020-10-31 LAB — PROTEIN / CREATININE RATIO, URINE
Creatinine, Urine: 49.82 mg/dL
Total Protein, Urine: <6 mg/dL

## 2020-10-31 LAB — TYPE AND SCREEN
ABO/RH(D): O POS
Antibody Screen: NEGATIVE

## 2020-10-31 MED ORDER — BUTALBITAL-APAP-CAFFEINE 50-325-40 MG PO TABS
1.0000 | ORAL_TABLET | Freq: Once | ORAL | Status: AC
  Administered 2020-10-31: 1 via ORAL
  Filled 2020-10-31: qty 1

## 2020-10-31 MED ORDER — MISOPROSTOL 25 MCG QUARTER TABLET
ORAL_TABLET | ORAL | Status: AC
  Filled 2020-10-31: qty 1

## 2020-10-31 MED ORDER — MISOPROSTOL 50MCG HALF TABLET
50.0000 ug | ORAL_TABLET | Freq: Once | ORAL | Status: AC
  Administered 2020-10-31: 50 ug via BUCCAL

## 2020-10-31 MED ORDER — LABETALOL HCL 5 MG/ML IV SOLN: 20.0000 mg | INTRAVENOUS | Status: DC | PRN

## 2020-10-31 MED ORDER — MISOPROSTOL 50MCG HALF TABLET
ORAL_TABLET | ORAL | Status: AC
  Filled 2020-10-31: qty 1

## 2020-10-31 MED ORDER — OXYTOCIN BOLUS FROM INFUSION
333.0000 mL | Freq: Once | INTRAVENOUS | Status: AC
  Administered 2020-11-01: 333 mL via INTRAVENOUS

## 2020-10-31 MED ORDER — TERBUTALINE SULFATE 1 MG/ML IJ SOLN: 0.2500 mg | Freq: Once | INTRAMUSCULAR | Status: DC | PRN

## 2020-10-31 MED ORDER — HYDRALAZINE HCL 20 MG/ML IJ SOLN: 10.0000 mg | INTRAMUSCULAR | Status: DC | PRN

## 2020-10-31 MED ORDER — OXYCODONE-ACETAMINOPHEN 5-325 MG PO TABS: 1.0000 | ORAL_TABLET | ORAL | Status: DC | PRN

## 2020-10-31 MED ORDER — ACETAMINOPHEN 325 MG PO TABS
650.0000 mg | ORAL_TABLET | ORAL | Status: DC | PRN
  Administered 2020-10-31 (×3): 650 mg via ORAL
  Filled 2020-10-31 (×3): qty 2

## 2020-10-31 MED ORDER — MISOPROSTOL 25 MCG QUARTER TABLET
25.0000 ug | ORAL_TABLET | ORAL | Status: DC
  Administered 2020-10-31: 25 ug via VAGINAL

## 2020-10-31 MED ORDER — LABETALOL HCL 200 MG PO TABS
200.0000 mg | ORAL_TABLET | Freq: Two times a day (BID) | ORAL | Status: DC
  Administered 2020-10-31: 200 mg via ORAL
  Filled 2020-10-31: qty 1

## 2020-10-31 MED ORDER — LABETALOL HCL 5 MG/ML IV SOLN: 40.0000 mg | INTRAVENOUS | Status: DC | PRN

## 2020-10-31 MED ORDER — LACTATED RINGERS IV SOLN: 500.0000 mL | INTRAVENOUS | Status: DC | PRN

## 2020-10-31 MED ORDER — LACTATED RINGERS IV SOLN: INTRAVENOUS | Status: DC

## 2020-10-31 MED ORDER — MAGNESIUM SULFATE 40 GM/1000ML IV SOLN
2.0000 g/h | INTRAVENOUS | Status: DC
  Administered 2020-11-01: 2 g/h via INTRAVENOUS
  Filled 2020-10-31 (×2): qty 1000

## 2020-10-31 MED ORDER — ONDANSETRON HCL 4 MG/2ML IJ SOLN: 4.0000 mg | Freq: Four times a day (QID) | INTRAMUSCULAR | Status: DC | PRN

## 2020-10-31 MED ORDER — OXYCODONE-ACETAMINOPHEN 5-325 MG PO TABS: 2.0000 | ORAL_TABLET | ORAL | Status: DC | PRN

## 2020-10-31 MED ORDER — OXYTOCIN-SODIUM CHLORIDE 30-0.9 UT/500ML-% IV SOLN
2.5000 [IU]/h | INTRAVENOUS | Status: DC
  Filled 2020-10-31: qty 500

## 2020-10-31 MED ORDER — LABETALOL HCL 5 MG/ML IV SOLN: 80.0000 mg | INTRAVENOUS | Status: DC | PRN

## 2020-10-31 MED ORDER — LIDOCAINE HCL (PF) 1 % IJ SOLN: 30.0000 mL | INTRAMUSCULAR | Status: DC | PRN

## 2020-10-31 MED ORDER — MAGNESIUM SULFATE BOLUS VIA INFUSION
4.0000 g | Freq: Once | INTRAVENOUS | Status: AC
  Administered 2020-10-31: 4 g via INTRAVENOUS
  Filled 2020-10-31: qty 1000

## 2020-10-31 MED ORDER — SOD CITRATE-CITRIC ACID 500-334 MG/5ML PO SOLN: 30.0000 mL | ORAL | Status: DC | PRN

## 2020-10-31 MED ORDER — OXYTOCIN-SODIUM CHLORIDE 30-0.9 UT/500ML-% IV SOLN
1.0000 m[IU]/min | INTRAVENOUS | Status: DC
  Administered 2020-10-31: 2 m[IU]/min via INTRAVENOUS
  Filled 2020-10-31: qty 500

## 2020-10-31 MED ORDER — MISOPROSTOL 50MCG HALF TABLET: 50.0000 ug | ORAL_TABLET | Freq: Once | ORAL | Status: DC

## 2020-10-31 NOTE — Progress Notes (Signed)
Labor Progress Note Alexis [name] is a 20 y.o. G1P0000 at [redacted]w[redacted]d presented for IOL of di/di twins for cHTN with superimposed pre-eclampsia  S: Reports feeling contractions. Headache still present, 5/10. Also reports blurred vision has worsened slightly on magnesium.   O:  BP (!) 152/101 (BP Location: Right Arm)   Pulse (!) 102   Temp 98 F (36.7 C) (Oral)   Resp 18   Ht 5\' 5"  (1.651 m)   Wt 92.2 kg   LMP 02/22/2020 (Exact Date)   SpO2 100%   BMI 33.83 kg/m  EFM:  Twin A: baseline 125/moderate variability/+accels/no decels Twin B: baseline 125/moderate variability/+accels/no decels Toco: q2-3 min   CVE: Dilation: 1 Effacement (%): 50 Cervical Position: Posterior Station: -2 Presentation: Vertex Exam by:: Dr. [email or web address]   A&P: 20 y.o. G1P0000 [redacted]w[redacted]d for IOL for cHTN with superimposed pre-eclampsia #IOL with di/di twins: s/p cytotec x2. Contracting too frequently for additional dose. FB placed with ease. Will consider starting pitocin if contractions space out.  #Pain: per patient request  #FWB: category I #GBS negative #Chronic HTN with superimposed pre-eclampsia with severe features: based on headache, blurred vision. Continues on Mg infusion started 1330. BP remains elevated. Continue home labetalol 200 mg BID. Plan for repeat labs at midnight or when she desires epidural if sooner.   [redacted]w[redacted]d, MD 9:23 PM

## 2020-10-31 NOTE — Progress Notes (Signed)
   PRENATAL VISIT NOTE  Subjective:  Alexis [name] is a 20 y.o. G1P0000 at [redacted]w[redacted]d being seen today for ongoing prenatal care.  She is currently monitored for the following issues for this high-risk pregnancy and has GERD (gastroesophageal reflux disease); Dichorionic diamniotic twin pregnancy in first trimester; Supervision of high risk pregnancy, antepartum; Chronic hypertension; COVID-19; and Anxiety on their problem list.  Patient reports persistent HA for 1 week that improves with medication but never goes away..  Contractions: Irritability. Vag. Bleeding: None.  Movement: Present. Denies leaking of fluid.   The following portions of the patient's history were reviewed and updated as appropriate: allergies, current medications, past family history, past medical history, past social history, past surgical history and problem list.   Objective:   Vitals:   10/31/20 0841  BP: (!) 135/94  Pulse: (!) 105  Weight: 203 lb (92.1 kg)    Fetal Status: Fetal Heart Rate (bpm): 139/141   Movement: Present     General:  Alert, oriented and cooperative. Patient is in no acute distress.  Skin: Skin is warm and dry. No rash noted.   Cardiovascular: Normal heart rate noted  Respiratory: Normal respiratory effort, no problems with respiration noted  Abdomen: Soft, gravid, appropriate for gestational age.  Pain/Pressure: Present     Pelvic: Cervical exam deferred        Extremities: Normal range of motion.  Edema: Trace  Mental Status: Normal mood and affect. Normal behavior. Normal judgment and thought content.   Assessment and Plan:  Pregnancy: G1P0000 at [redacted]w[redacted]d 1. [redacted] weeks gestation of pregnancy  2. Supervision of high risk pregnancy, antepartum FHT and FH normal. GBS negative  3. Chronic hypertension  4. Dichorionic diamniotic twin pregnancy in first trimester Concordant growth.  Last growth [place] 4/20: 28%/36% Vtx/Vtx  5. COVID-19  6. Chronic hypertension with superimposed  preeclampsia Discussed with Dr Grace Bushy - concerning for superimposed pre-eclampsia Recommended IOL Patient sent to L&D - charge nurse informed and labor team notified.  Preterm labor symptoms and general obstetric precautions including but not limited to vaginal bleeding, contractions, leaking of fluid and fetal movement were reviewed in detail with the patient. Please refer to After Visit Summary for other counseling recommendations.   No follow-ups on file.  Future Appointments  Date Time Provider Department Center  10/31/2020 12:30 PM [facility] NURSE [facility]  10/31/2020 12:45 PM [facility] US4 [facility]  11/07/2020 10:15 AM [facility] NURSE [facility]  11/07/2020 10:30 AM [facility] US3 [facility]  11/08/2020  8:45 AM Willodean Rosenthal, MD [facility] None  11/16/2020  8:40 AM Little Ishikawa, MD [facility]    [name], DO

## 2020-10-31 NOTE — H&P (Signed)
OBSTETRIC ADMISSION HISTORY AND PHYSICAL  Alexis [name] is a 20 y.o. female G1P0000 with IUP at [redacted]w[redacted]d by L/8 presenting for IOL secondary to cHTN with severe superimposed preeclampsia. She reports +FMs, No LOF, no VB, no blurry vision or peripheral edema, and RUQ pain. Reports moderate to severe headache for 1 week, worsening despite medications. She plans on formula feeding. She is considering post-placental vs outpatient Liletta IUD for birth control.  She received her prenatal care at [facility]  Dating: By L/8 --->  Estimated Date of Delivery: 11/28/20  Sono:  @[redacted]w[redacted]d , Twin A: cephalic presentation, 1806g, EFW; Twin B: cephalic presentation, 1867g, 93% EFW, FW Discordancy 3%  Prenatal History/Complications:  - DiDi twins - Chronic hypertension (labetalol 200mg  BID in pregnancy) - COVID19 - Anxiety - GERD  Past Medical History: Past Medical History:  Diagnosis Date  . Anxiety   . COVID-19   . Depression   . GERD (gastroesophageal reflux disease)   . Tachycardia    no medications-intermittent    Past Surgical History: Past Surgical History:  Procedure Laterality Date  . NO PAST SURGERIES      Obstetrical History: OB History    Gravida  1   Para  0   Term  0   Preterm  0   AB  0   Living  0     SAB  0   IAB  0   Ectopic  0   Multiple  0   Live Births  0           Social History Social History   Socioeconomic History  . Marital status: Single    Spouse name: Not on file  . Number of children: Not on file  . Years of education: Not on file  . Highest education level: Not on file  Occupational History  . Not on file  Tobacco Use  . Smoking status: Never Smoker  . Smokeless tobacco: Never Used  Vaping Use  . Vaping Use: Never used  Substance and Sexual Activity  . Alcohol use: Not Currently  . Drug use: Not Currently  . Sexual activity: Yes    Birth control/protection: None  Other Topics Concern  . Not on file  Social  History Narrative  . Not on file   Social Determinants of Health   Financial Resource Strain: Not on file  Food Insecurity: Not on file  Transportation Needs: Not on file  Physical Activity: Not on file  Stress: Not on file  Social Connections: Not on file    Family History: Family History  Problem Relation Age of Onset  . Diabetes Mellitus II Mother   . Hypertension Mother   . Hypertension Father   . Stomach cancer Maternal Aunt   . Stomach cancer Paternal Grandmother     Allergies: Allergies  Allergen Reactions  . Dust Mite Extract Hives  . Mixed Ragweed Hives  . Venofer [Iron Sucrose] Other (See Comments)    Chest pain    Medications Prior to Admission  Medication Sig Dispense Refill Last Dose  . acetaminophen (TYLENOL) 500 MG tablet Take 500 mg by mouth every 6 (six) hours as needed. (Patient not taking: Reported on 10/31/2020)     . amoxicillin-clavulanate (AUGMENTIN) 875-125 MG tablet Take 1 tablet by mouth 2 (two) times daily. (Patient not taking: Reported on 10/31/2020) 20 tablet 1   . aspirin EC 81 MG tablet Take 81 mg by mouth daily. Swallow whole.     . calcium carbonate (  TUMS - DOSED IN MG ELEMENTAL CALCIUM) 500 MG chewable tablet Chew 1 tablet by mouth daily. (Patient not taking: Reported on 10/31/2020)     . cetirizine (ZYRTEC) 10 MG tablet Take 10 mg by mouth daily. (Patient not taking: Reported on 10/31/2020)     . cyclobenzaprine (FLEXERIL) 10 MG tablet Take 1 tablet (10 mg total) by mouth 3 (three) times daily as needed for muscle spasms. (Patient not taking: No sig reported) 30 tablet 0   . Doxylamine-Pyridoxine (DICLEGIS) 10-10 MG TBEC Take 2 tablets by mouth at bedtime. If symptoms persist, add one tablet in the morning and one in the afternoon 100 tablet 5   . famotidine (PEPCID) 20 MG tablet Take 1 tablet (20 mg total) by mouth 2 (two) times daily. (Patient not taking: Reported on 10/31/2020) 60 tablet 3   . Ferrous Sulfate Dried (HIGH POTENCY IRON) 65 MG  TABS Take by mouth.     . fluticasone (FLONASE) 50 MCG/ACT nasal spray Place 1 spray into both nostrils in the morning and at bedtime. (Patient not taking: Reported on 10/31/2020) 9.9 mL 2   . labetalol (NORMODYNE) 200 MG tablet Take 1 tablet (200 mg total) by mouth 2 (two) times daily. 60 tablet 3   . metoCLOPramide (REGLAN) 10 MG tablet Take 1 tablet (10 mg total) by mouth every 8 (eight) hours as needed (take with tylenol for headache). (Patient not taking: No sig reported) 30 tablet 0   . ondansetron (ZOFRAN-ODT) 8 MG disintegrating tablet Take 1 tablet (8 mg total) by mouth every 8 (eight) hours as needed for nausea or vomiting. (Patient not taking: No sig reported) 20 tablet 0   . pantoprazole (PROTONIX) 40 MG tablet Take 1 tablet (40 mg total) by mouth daily. 30 tablet 1   . Prenatal Vit-Fe Fumarate-FA (PRENATAL VITAMINS PO) Take 1 tablet by mouth daily.     . sertraline (ZOLOFT) 50 MG tablet TAKE 1 TABLET BY MOUTH EVERY DAY 90 tablet 2      Review of Systems   All systems reviewed and negative except as stated in HPI  Blood pressure (!) 144/92, pulse 99, resp. rate 18, height 5\' 5"  (1.651 m), weight 92.2 kg, last menstrual period 02/22/2020. General appearance: alert, cooperative and appears stated age Lungs:  Normal WOB Heart: regular rate Abdomen: soft, non-tender Extremities: no sign of DVT Presentation: cephalic/cephalic Fetal monitoring: Twin A: baseline 120, moderate variability, +accels, no decels Twin B: baseline 130, moderate variability, +accels, no decels Uterine activity: irregular Exam by:: Dr. [email or web address]  Prenatal labs: ABO, Rh: O/Positive/-- (10/25 1417) Antibody: Negative (10/25 1417) Rubella: 3.18 (10/25 1417) RPR: Non Reactive (03/24 0840)  HBsAg: Negative (10/25 1417)  HIV: Non Reactive (03/24 0840)  GBS: Negative/-- (05/11 0954)  2 hr Glucola wnl Genetic screening wnl Anatomy 08-12-1974 wnl, didi twins  Prenatal Transfer Tool  Maternal Diabetes: No Genetic  Screening: Normal Maternal Ultrasounds/Referrals: Normal Fetal Ultrasounds or other Referrals:  Referred to Materal Fetal Medicine (didi twins) Maternal Substance Abuse:  No Significant Maternal Medications:  labetalol Significant Maternal Lab Results: Group B Strep negative  No results found for this or any previous visit (from the past 24 hour(s)).  Patient Active Problem List   Diagnosis Date Noted  . COVID-19   . Anxiety   . Chronic hypertension 08/27/2020  . Dichorionic diamniotic twin pregnancy in first trimester 04/09/2020  . Supervision of high risk pregnancy, antepartum 04/09/2020  . GERD (gastroesophageal reflux disease)     Assessment/Plan:  04/11/2020  [place] is a 20 y.o. G1P0000 at [redacted]w[redacted]d here for IOL secondary to cHTN with severe SIPE in the setting of DiDi Twins.  #Labor: Given initial cervical exam, cytotec was administered. Will plan for repeat cervical exam in 4 hours with possible FB placement. #Pain: TBD per pt preference  #FWB:  Category 1 strip x2 #ID: GBS negative #MOF: formula #MOC: considering post-placental vs outpatient IUD #Circ: desired #cHTN with Severe SIPE: given persistent severe HA without vision changes on admission, magnesium was started. Reassuringly, BP remains in mild range. No other concerning symptoms. Will continue home labetalol 200mg  BID. Will continue to monitor closely. #Anxiety: plan for SW consult in postpartum period  , MD  10/31/2020, 12:22 PM

## 2020-10-31 NOTE — Progress Notes (Addendum)
Labor Progress Note Alexis [name] is a 20 y.o. G1P0000 at [redacted]w[redacted]d presented for IOL of di/di twins for cHTN with superimposed pre-eclampsia S: Reporting headache that did not resolve with Tylenol. Just received a dose of Fioricet. Otherwise no concerns. Discussed FB vs repeat dose of misoprostol, patient opted to try another dose of misoprostol for now.  O:  BP 140/89   Pulse 98   Temp 97.9 F (36.6 C) (Oral)   Resp 18   Ht 5\' 5"  (1.651 m)   Wt 92.2 kg   LMP 02/22/2020 (Exact Date)   BMI 33.83 kg/m  EFM:  Twin A: baseline 125/moderate variability/+accels/no decels Twin B: baseline 125/moderate variability/+accels/no decels Toco: contractions every 8-57min  CVE: Dilation: Closed Effacement (%): Thick Cervical Position: Posterior Presentation: Vertex Exam by:: Dr. [email or web address]   A&P: 20 y.o. G1P0000 [redacted]w[redacted]d for IOL for cHTN with superimposed pre-eclampsia #IOL with di/di twins: Remains in latent labor, cervix remains closed. Now s/p misoprostol x2 (most recent dose 1635). Consider FB after 4 hours pending cervical check. #Pain: oxycodone-APAP prn #FWB: category I #GBS negative #pre-eclampsia with severe features: s/p Fioricet for headache. Continues on Mg infusion since 1330. BP remains elevated. Continue home labetalol 200 mg BID.   [redacted]w[redacted]d, MD 4:33 PM

## 2020-10-31 NOTE — Progress Notes (Signed)
Pt c/o headache as a 6 on a scale of 1-10 Pt c/o blurry vision

## 2020-10-31 NOTE — Progress Notes (Signed)
Labor Progress Note Alexis [name] is a 20 y.o. G1P0000 at [redacted]w[redacted]d presented for IOL of di/di twins for cHTN with superimposed pre-eclampsia  S: feels some pressure O:  BP (!) 144/88   Pulse 92   Temp 98 F (36.7 C) (Oral)   Resp 17   Ht 5\' 5"  (1.651 m)   Wt 92.2 kg   LMP 02/22/2020 (Exact Date)   SpO2 100%   BMI 33.83 kg/m  EFM:  Twin A: baseline 120/moderate variability/+accels/no decels Twin B: baseline 125/moderate variability/+accels/no decels Toco: q2-3 min   CVE: Dilation: 5.5 Effacement (%): 60 Cervical Position: Posterior Station: -2 Presentation: Vertex Exam by:: cwhite,rnc   A&P: 20 y.o. G1P0000 [redacted]w[redacted]d for IOL for cHTN with superimposed pre-eclampsia #IOL with di/di twins: Progressing well. s/p cytotec x2. FB placed at 2100, now out. Will start pitocin 2x2. #Pain: per patient request  #FWB: category I #GBS negative #Chronic HTN with superimposed pre-eclampsia with severe features: based on headache, blurred vision. Continues on Mg infusion started 1330. BP remains elevated. Continue home labetalol 200 mg BID. Plan for repeat labs at midnight or when she desires epidural if sooner.   2101, MD 11:14 PM

## 2020-10-31 NOTE — Plan of Care (Signed)
  Problem: Education: Goal: Knowledge of Childbirth will improve Outcome: Progressing Goal: Ability to make informed decisions regarding treatment and plan of care will improve Outcome: Progressing   Problem: Education: Goal: Knowledge of General Education information will improve Description: Including pain rating scale, medication(s)/side effects and non-pharmacologic comfort measures Outcome: Progressing   Problem: Coping: Goal: Level of anxiety will decrease Outcome: Progressing

## 2020-11-01 ENCOUNTER — Inpatient Hospital Stay (HOSPITAL_COMMUNITY): Payer: Medicaid Other | Admitting: Anesthesiology

## 2020-11-01 ENCOUNTER — Encounter (HOSPITAL_COMMUNITY): Payer: Self-pay | Admitting: Obstetrics & Gynecology

## 2020-11-01 DIAGNOSIS — O114 Pre-existing hypertension with pre-eclampsia, complicating childbirth: Secondary | ICD-10-CM

## 2020-11-01 DIAGNOSIS — O30043 Twin pregnancy, dichorionic/diamniotic, third trimester: Secondary | ICD-10-CM

## 2020-11-01 DIAGNOSIS — O1410 Severe pre-eclampsia, unspecified trimester: Secondary | ICD-10-CM

## 2020-11-01 DIAGNOSIS — O1092 Unspecified pre-existing hypertension complicating childbirth: Secondary | ICD-10-CM

## 2020-11-01 DIAGNOSIS — Z3A36 36 weeks gestation of pregnancy: Secondary | ICD-10-CM

## 2020-11-01 LAB — CBC
HCT: 33 % — ABNORMAL LOW (ref 36.0–46.0)
HCT: 33.5 % — ABNORMAL LOW (ref 36.0–46.0)
Hemoglobin: 10.6 g/dL — ABNORMAL LOW (ref 12.0–15.0)
Hemoglobin: 10.6 g/dL — ABNORMAL LOW (ref 12.0–15.0)
MCH: 31.5 pg (ref 26.0–34.0)
MCH: 31.8 pg (ref 26.0–34.0)
MCHC: 31.6 g/dL (ref 30.0–36.0)
MCHC: 32.1 g/dL (ref 30.0–36.0)
MCV: 99.1 fL (ref 80.0–100.0)
MCV: 99.4 fL (ref 80.0–100.0)
Platelets: 180 10*3/uL (ref 150–400)
Platelets: 198 10*3/uL (ref 150–400)
RBC: 3.33 MIL/uL — ABNORMAL LOW (ref 3.87–5.11)
RBC: 3.37 MIL/uL — ABNORMAL LOW (ref 3.87–5.11)
RDW: 14.5 % (ref 11.5–15.5)
RDW: 14.6 % (ref 11.5–15.5)
WBC: 8.6 10*3/uL (ref 4.0–10.5)
WBC: 9.2 10*3/uL (ref 4.0–10.5)
nRBC: 0 % (ref 0.0–0.2)
nRBC: 0 % (ref 0.0–0.2)

## 2020-11-01 LAB — COMPREHENSIVE METABOLIC PANEL
ALT: 14 U/L (ref 0–44)
AST: 24 U/L (ref 15–41)
Albumin: 2.9 g/dL — ABNORMAL LOW (ref 3.5–5.0)
Alkaline Phosphatase: 142 U/L — ABNORMAL HIGH (ref 38–126)
Anion gap: 5 (ref 5–15)
BUN: <5 mg/dL — ABNORMAL LOW (ref 6–20)
CO2: 20 mmol/L — ABNORMAL LOW (ref 22–32)
Calcium: 8 mg/dL — ABNORMAL LOW (ref 8.9–10.3)
Chloride: 109 mmol/L (ref 98–111)
Creatinine, Ser: 0.66 mg/dL (ref 0.44–1.00)
GFR, Estimated: >60 mL/min (ref >60)
Glucose, Bld: 107 mg/dL — ABNORMAL HIGH (ref 70–99)
Potassium: 3.7 mmol/L (ref 3.5–5.1)
Sodium: 134 mmol/L — ABNORMAL LOW (ref 135–145)
Total Bilirubin: 0.4 mg/dL (ref 0.3–1.2)
Total Protein: 5.9 g/dL — ABNORMAL LOW (ref 6.5–8.1)

## 2020-11-01 LAB — MAGNESIUM: Magnesium: 5.3 mg/dL — ABNORMAL HIGH (ref 1.7–2.4)

## 2020-11-01 LAB — RPR: RPR Ser Ql: NONREACTIVE

## 2020-11-01 MED ORDER — FENTANYL-BUPIVACAINE-NACL 0.5-0.125-0.9 MG/250ML-% EP SOLN
12.0000 mL/h | EPIDURAL | Status: DC | PRN
  Administered 2020-11-01: 12 mL/h via EPIDURAL
  Filled 2020-11-01: qty 250

## 2020-11-01 MED ORDER — CARBOPROST TROMETHAMINE 250 MCG/ML IM SOLN: 250.0000 ug | Freq: Once | INTRAMUSCULAR | Status: AC

## 2020-11-01 MED ORDER — ACETAMINOPHEN 325 MG PO TABS
650.0000 mg | ORAL_TABLET | Freq: Four times a day (QID) | ORAL | Status: DC
  Administered 2020-11-01 – 2020-11-03 (×8): 650 mg via ORAL
  Filled 2020-11-01 (×8): qty 2

## 2020-11-01 MED ORDER — FERROUS SULFATE 325 (65 FE) MG PO TABS
325.0000 mg | ORAL_TABLET | ORAL | Status: DC
  Administered 2020-11-01 – 2020-11-03 (×2): 325 mg via ORAL
  Filled 2020-11-01 (×2): qty 1

## 2020-11-01 MED ORDER — DIPHENHYDRAMINE HCL 25 MG PO CAPS: 25.0000 mg | ORAL_CAPSULE | Freq: Four times a day (QID) | ORAL | Status: DC | PRN

## 2020-11-01 MED ORDER — CARBOPROST TROMETHAMINE 250 MCG/ML IM SOLN
INTRAMUSCULAR | Status: AC
  Administered 2020-11-01: 250 ug via INTRAMUSCULAR
  Filled 2020-11-01: qty 1

## 2020-11-01 MED ORDER — DIBUCAINE (PERIANAL) 1 % EX OINT: 1.0000 "application " | TOPICAL_OINTMENT | CUTANEOUS | Status: DC | PRN

## 2020-11-01 MED ORDER — BUTALBITAL-APAP-CAFFEINE 50-325-40 MG PO TABS
1.0000 | ORAL_TABLET | Freq: Once | ORAL | Status: DC
  Filled 2020-11-01: qty 1

## 2020-11-01 MED ORDER — ONDANSETRON HCL 4 MG/2ML IJ SOLN: 4.0000 mg | INTRAMUSCULAR | Status: DC | PRN

## 2020-11-01 MED ORDER — TRANEXAMIC ACID-NACL 1000-0.7 MG/100ML-% IV SOLN
1000.0000 mg | INTRAVENOUS | Status: AC
  Administered 2020-11-01: 1000 mg via INTRAVENOUS

## 2020-11-01 MED ORDER — PHENYLEPHRINE 40 MCG/ML (10ML) SYRINGE FOR IV PUSH (FOR BLOOD PRESSURE SUPPORT): 80.0000 ug | PREFILLED_SYRINGE | INTRAVENOUS | Status: DC | PRN

## 2020-11-01 MED ORDER — PRENATAL MULTIVITAMIN CH
1.0000 | ORAL_TABLET | Freq: Every day | ORAL | Status: DC
  Administered 2020-11-02 – 2020-11-03 (×2): 1 via ORAL
  Filled 2020-11-01 (×2): qty 1

## 2020-11-01 MED ORDER — EPHEDRINE 5 MG/ML INJ: 10.0000 mg | INTRAVENOUS | Status: DC | PRN

## 2020-11-01 MED ORDER — TETANUS-DIPHTH-ACELL PERTUSSIS 5-2.5-18.5 LF-MCG/0.5 IM SUSY: 0.5000 mL | PREFILLED_SYRINGE | Freq: Once | INTRAMUSCULAR | Status: DC

## 2020-11-01 MED ORDER — SENNOSIDES-DOCUSATE SODIUM 8.6-50 MG PO TABS
2.0000 | ORAL_TABLET | Freq: Every day | ORAL | Status: DC
  Administered 2020-11-02 – 2020-11-03 (×2): 2 via ORAL
  Filled 2020-11-01 (×2): qty 2

## 2020-11-01 MED ORDER — ONDANSETRON HCL 4 MG PO TABS: 4.0000 mg | ORAL_TABLET | ORAL | Status: DC | PRN

## 2020-11-01 MED ORDER — MAGNESIUM SULFATE 40 GM/1000ML IV SOLN
2.0000 g/h | INTRAVENOUS | Status: DC
  Administered 2020-11-01: 2 g/h via INTRAVENOUS
  Filled 2020-11-01: qty 1000

## 2020-11-01 MED ORDER — DIPHENHYDRAMINE HCL 50 MG/ML IJ SOLN: 12.5000 mg | INTRAMUSCULAR | Status: DC | PRN

## 2020-11-01 MED ORDER — COCONUT OIL OIL: 1.0000 "application " | TOPICAL_OIL | Status: DC | PRN

## 2020-11-01 MED ORDER — BENZOCAINE-MENTHOL 20-0.5 % EX AERO
1.0000 "application " | INHALATION_SPRAY | CUTANEOUS | Status: DC | PRN
  Administered 2020-11-01: 1 via TOPICAL
  Filled 2020-11-01: qty 56

## 2020-11-01 MED ORDER — LIDOCAINE HCL (PF) 1 % IJ SOLN
INTRAMUSCULAR | Status: DC | PRN
  Administered 2020-11-01 (×2): 5 mL via EPIDURAL

## 2020-11-01 MED ORDER — WITCH HAZEL-GLYCERIN EX PADS: 1.0000 "application " | MEDICATED_PAD | CUTANEOUS | Status: DC | PRN

## 2020-11-01 MED ORDER — SIMETHICONE 80 MG PO CHEW: 80.0000 mg | CHEWABLE_TABLET | ORAL | Status: DC | PRN

## 2020-11-01 MED ORDER — DIPHENOXYLATE-ATROPINE 2.5-0.025 MG PO TABS
2.0000 | ORAL_TABLET | Freq: Once | ORAL | Status: AC
  Administered 2020-11-01: 2 via ORAL
  Filled 2020-11-01: qty 2

## 2020-11-01 MED ORDER — LACTATED RINGERS IV SOLN
500.0000 mL | Freq: Once | INTRAVENOUS | Status: AC
  Administered 2020-11-01: 500 mL via INTRAVENOUS

## 2020-11-01 MED ORDER — LACTATED RINGERS IV SOLN: INTRAVENOUS | Status: DC

## 2020-11-01 MED ORDER — IBUPROFEN 600 MG PO TABS
600.0000 mg | ORAL_TABLET | Freq: Four times a day (QID) | ORAL | Status: DC
  Administered 2020-11-01 – 2020-11-03 (×8): 600 mg via ORAL
  Filled 2020-11-01 (×9): qty 1

## 2020-11-01 MED ORDER — NIFEDIPINE ER OSMOTIC RELEASE 30 MG PO TB24
30.0000 mg | ORAL_TABLET | Freq: Every day | ORAL | Status: DC
  Administered 2020-11-01: 30 mg via ORAL
  Filled 2020-11-01: qty 1

## 2020-11-01 MED ORDER — TRANEXAMIC ACID-NACL 1000-0.7 MG/100ML-% IV SOLN
INTRAVENOUS | Status: AC
  Filled 2020-11-01: qty 100

## 2020-11-01 NOTE — Discharge Instructions (Signed)

## 2020-11-01 NOTE — Anesthesia Preprocedure Evaluation (Signed)
Anesthesia Evaluation  Patient identified by MRN, date of birth, ID band Patient awake    Reviewed: Allergy & Precautions, NPO status , Patient's Chart, lab work & pertinent test results  Airway Mallampati: III  TM Distance: >3 FB Neck ROM: Full    Dental no notable dental hx.    Pulmonary neg pulmonary ROS,    Pulmonary exam normal breath sounds clear to auscultation       Cardiovascular hypertension (preeclampsia on Mag), Pt. on medications Normal cardiovascular exam Rhythm:Regular Rate:Normal     Neuro/Psych PSYCHIATRIC DISORDERS Anxiety Depression negative neurological ROS     GI/Hepatic Neg liver ROS, GERD  ,  Endo/Other  negative endocrine ROS  Renal/GU negative Renal ROS  negative genitourinary   Musculoskeletal negative musculoskeletal ROS (+)   Abdominal   Peds negative pediatric ROS (+)  Hematology negative hematology ROS (+)   Anesthesia Other Findings   Reproductive/Obstetrics (+) Pregnancy (twins)                             Anesthesia Physical Anesthesia Plan  ASA: III  Anesthesia Plan: Epidural   Post-op Pain Management:    Induction:   PONV Risk Score and Plan: 2 and Treatment may vary due to age or medical condition  Airway Management Planned: Natural Airway  Additional Equipment: None  Intra-op Plan:   Post-operative Plan:   Informed Consent: I have reviewed the patients History and Physical, chart, labs and discussed the procedure including the risks, benefits and alternatives for the proposed anesthesia with the patient or authorized representative who has indicated his/her understanding and acceptance.       Plan Discussed with: Anesthesiologist  Anesthesia Plan Comments:         Anesthesia Quick Evaluation

## 2020-11-01 NOTE — Discharge Summary (Signed)
Postpartum Discharge Summary  Date of Service updated 11/03/2020     Patient Name: Alexis [name] DOB: [birth year] MRN: [id number]  Date of admission: 10/31/2020 Delivery date:   Kaiah, Hosea [[id number]]  11/01/2020    Caelyn, Route [[id number]]  11/01/2020   Delivering provider:    Devone, Bonilla [[id number]]  Glennie, Bose [[id number]]  [place]   Date of discharge: 11/03/2020  Admitting diagnosis: Supervision of high risk pregnancy, antepartum [O09.90] Intrauterine pregnancy: [redacted]w[redacted]d    Secondary diagnosis:  Principal Problem:   Vaginal delivery Active Problems:   Dichorionic diamniotic twin pregnancy in first trimester   Supervision of high risk pregnancy, antepartum   Chronic hypertension   Anxiety   Postpartum hemorrhage   Severe preeclampsia  Additional problems: as noted above   Discharge diagnosis: Vaginal delivery of Didi twins                                           Post partum procedures:PPH treatment Augmentation: AROM, Pitocin, Cytotec and IP Foley Complications: [id number] Hospital course: Induction of Labor With Vaginal Delivery   20y.o. yo G1P0102 at 358w1das admitted to the hospital 10/31/2020 for induction of labor.  Indication for induction: cHTN with severe superimposed preeclampsia in the setting of didi twins.  Patient had an uncomplicated labor course as follows: Membrane Rupture Time/Date:    ArNovalie, Leamy0[[id number]]4:38 AM    ArJeannetta Nap0[[id number]]10:13 AM  ,   ArZela, Sobieski0[[id number]]11/01/2020    ArClelia, Trabucco0[[id number]]11/01/2020    Delivery Method:   ArBrynda Peon0[[id number]]Vaginal, Spontaneous    ArJerae, Izard0[[id number]]Vaginal, Spontaneous   Episiotomy:    ArLucette, Kratz0[[id number]]None    ArTerry, Abila0[[id number]]None    Lacerations:     ArJasminne, Mealy0[[id number]]2nd degree;Periurethral    ArElita, Dame0[[id number]]2nd degree;Labial;Periurethral   Details of delivery can be found in separate delivery note.  Patient had a routine postpartum course. Patient is discharged home 11/03/20.  Newborn Data: Birth date:   ArThomasine, Klutts0[[id number]]11/01/2020    ArMischell, Branford0[[id number]]11/01/2020   Birth time:   ArLucindia, Lemley0[[id number]]10:10 AM    ArJeannetta Nap0[[id number]]10:26 AM   Gender:   ArDaun, Rens0[[id number]]Female    ArAdhya, Cocco0[[id number]]Female   Living status:   ArJakerra, Floyd0[[id number]]Living    ArGursimran, Litaker0[[id number]]Living   Apgars:   ArCristan, Hout0[[id number]]8 [address]0[[id number]]4 [place],   ArBarbee, Mamula0[[id number]]9 [address][[id number]]8   Weight:   ArMariany, Mackintosh0[[id number]]  7048    ArJessamy, Torosyan0[[id number]]  3888    Magnesium Sulfate received: Yes: Seizure prophylaxis BMZ received: No Rhophylac:N/A MMR:N/A T-DaP:Given prenatally Flu: Nooffered prior to discharge Transfusion:No  Physical exam  Vitals:   11/02/20 2000 11/03/20 0006 11/03/20 0509 11/03/20 0750  BP: 122/65 130/77  (!) 137/91  Pulse: (!) 112 (!) 101  (!) 104  Resp: 18  _0 Temp: 98.5 F (36.9 C) 98.1 F (36.7 C) (!) 97.5 F (36.4 C) 98.1 F (36.7 C)  TempSrc: Oral Oral Oral Oral  SpO2: 100% 99% 100% 100%  Weight:      Height:       General: alert Lochia: appropriate Uterine Fundus: firm Incision: Healing well with no significant drainage DVT Evaluation: No evidence of DVT seen on physical exam. Labs: Lab Results  Component Value Date   WBC 10.4 11/02/2020   HGB 9.4 (L) 11/02/2020   HCT 29.3 (L) 11/02/2020   MCV 99.3 11/02/2020   PLT 189  11/02/2020   CMP Latest Ref Rng & Units 11/01/2020  Glucose 70 - 99 mg/dL 107(H)  BUN 6 - 20 mg/dL <5(L)  Creatinine 0.44 - 1.00 mg/dL 0.66  Sodium 135 - 145 mmol/L 134(L)  Potassium 3.5 - 5.1 mmol/L 3.7  Chloride 98 - 111 mmol/L 109  CO2 22 - 32 mmol/L 20(L)  Calcium 8.9 - 10.3 mg/dL 8.0(L)  Total Protein 6.5 - 8.1 g/dL 5.9(L)  Total Bilirubin 0.3 - 1.2 mg/dL 0.4  Alkaline Phos 38 - 126 U/L 142(H)  AST 15 - 41 U/L 24  ALT 0 - 44 U/L 14   Edinburgh Score: Edinburgh Postnatal Depression Scale Screening Tool 11/01/2020  I have been able to laugh and see the funny side of things. (No Data)     After visit meds:  Allergies as of 11/03/2020      Reactions   Dust Mite Extract Hives   Mixed Ragweed Hives   Venofer [iron Sucrose] Other (See Comments)   Chest pain      Medication List    STOP taking these medications   acetaminophen 500 MG tablet Commonly known as: TYLENOL   amoxicillin-clavulanate 875-125 MG tablet Commonly known as: AUGMENTIN   aspirin EC 81 MG tablet   calcium carbonate 500 MG chewable tablet Commonly known as: TUMS - dosed in mg elemental calcium   cetirizine 10 MG tablet Commonly known as: ZYRTEC   cyclobenzaprine 10 MG tablet Commonly known as: FLEXERIL   Doxylamine-Pyridoxine 10-10 MG Tbec Commonly known as: Diclegis   famotidine 20 MG tablet Commonly known as: PEPCID   fluticasone 50 MCG/ACT nasal spray Commonly known as: Flonase   labetalol 200 MG tablet Commonly known as: NORMODYNE   metoCLOPramide 10 MG tablet Commonly known as: REGLAN   ondansetron 8 MG disintegrating tablet Commonly known as: ZOFRAN-ODT   pantoprazole 40 MG tablet Commonly known as: PROTONIX     TAKE these medications   High Potency Iron 65 MG Tabs Take by mouth.   ibuprofen 600 MG tablet Commonly known as: ADVIL Take 1 tablet (600 mg total) by mouth every 6 (six) hours.   NIFEdipine 60 MG 24 hr tablet Commonly known as: ADALAT CC Take 1 tablet (60  mg total) by mouth daily. Start taking on: Nov 04, 2020   oxyCODONE-acetaminophen 5-325 MG tablet Commonly known as: PERCOCET/ROXICET Take 1 tablet by mouth every 4 (four) hours as needed (for pain scale greater than or equal to 4 and less than 7).   PRENATAL VITAMINS PO Take 1 tablet by mouth daily.   sertraline 50 MG tablet Commonly known as: ZOLOFT TAKE 1 TABLET BY MOUTH EVERY DAY        Discharge home in stable condition Infant Feeding: No evidence of DVT seen on physical exam. Infant Disposition:home with mother Discharge instruction: per After Visit Summary and Postpartum booklet. Activity: Advance as tolerated. Pelvic  rest for 6 weeks.  Diet: routine diet Future Appointments: Future Appointments  Date Time Provider [place]  11/08/2020 10:00 AM [facility] NURSE [facility] None  11/16/2020  8:40 AM Donato Heinz, MD [facility]  12/13/2020  1:00 PM Truett Mainland, DO [facility] None   Follow up Visit:  [facility] [place] Follow up in 1 week(s).   Specialty: Obstetrics and Gynecology Why: BP check  Contact information: [place] [address] [phone]             Message sent to [facility] by Dr. Astrid Drafts  Please schedule this patient for a In person postpartum visit in 6 weeks with the following provider: Any provider. Additional Postpartum F/U:Postpartum Depression checkup and BP check 1 week  High risk pregnancy complicated by: Didi twins, cHTN with superimposed severe preeclampsia, high anxiety Delivery mode:     Jeananne, Bedwell [[id number]]  Vaginal, Spontaneous    Melenie, Minniear [[id number]]  Vaginal, Spontaneous   Anticipated Birth Control:  plan for outpatient IUD   11/03/2020 Chancy Milroy, MD

## 2020-11-01 NOTE — Progress Notes (Addendum)
Labor Progress Note Angel Hobdy is a 20 y.o. G1P0000 at [redacted]w[redacted]d presented for IOL of di/di twins for cHTN with superimposed pre-eclampsia  S: feeling more frequent contractions now. Comfortable with epidural. No other concerns.  O:  BP (!) 143/86   Pulse 97   Temp 97.7 F (36.5 C) (Oral)   Resp 18   Ht 5\' 5"  (1.651 m)   Wt 92.2 kg   LMP 02/22/2020 (Exact Date)   SpO2 100%   BMI 33.83 kg/m  EFM:  Twin A: baseline 120/moderate variability/+accels/no decels Twin B: baseline 120/moderate variability/+accels/no decels Toco: contractions every 2-3 min  CVE: Dilation: 10 Dilation Complete Date: 11/01/20 Dilation Complete Time: 0842 Effacement (%): 100 Cervical Position: Middle Station: Plus 2 Presentation: Vertex Exam by:: Dr. [email or web address]   A&P: 20 y.o. G1P0000 [redacted]w[redacted]d for IOL for cHTN with superimposed pre-eclampsia #IOL with di/di twins: in active labor, pitocin at 14 mu/min, expect SVD soon #Pain: epidural #FWB: category I #GBS negative #pre-eclampsia with severe features: Continues on Mg infusion since 5/18 at 1330. BP remains elevated. Continue home labetalol 200 mg BID.   6/18, MD 9:37 AM

## 2020-11-01 NOTE — Progress Notes (Signed)
Labor Progress Note Alexis [name] is a 20 y.o. G1P0000 at [redacted]w[redacted]d presented for IOL of di/di twins for cHTN with superimposed pre-eclampsia  S:  More uncomfortable w contractions   O:  BP (!) 149/89 (BP Location: Right Arm)   Pulse 94   Temp 97.9 F (36.6 C) (Oral)   Resp 17   Ht 5\' 5"  (1.651 m)   Wt 92.2 kg   LMP 02/22/2020 (Exact Date)   SpO2 100%   BMI 33.83 kg/m  EFM:  Twin A: baseline 115/moderate variability/+accels/no decels Twin B: baseline 120/moderate variability/+accels/no decels Toco: q2-3 min   CVE: Dilation: 5.5 Effacement (%): 70 Cervical Position: Posterior Station: -2 Presentation: Vertex Exam by:: Dr. [email or web address]   A&P: 20 y.o. G1P0000 [redacted]w[redacted]d for IOL for cHTN with superimposed pre-eclampsia #IOL with di/di twins: Progressing well. s/p cytotec x2. S/p FB. Pitocin started at 2330. AROM at 0430 for clear fluid. #Pain: desires epidural at this time #FWB: category I #GBS negative #Chronic HTN with superimposed pre-eclampsia with severe features: based on headache, blurred vision. Continues on Mg infusion started 1330. BP remains elevated. Continue home labetalol 200 mg BID. Repeat labs performed at midnight and are stable. Magnesium level therapeutic at 5.3. Continue current rate of infusion.   2331, MD 4:57 AM

## 2020-11-01 NOTE — Anesthesia Postprocedure Evaluation (Signed)
Anesthesia Post Note  Patient: Wellsite geologist  Procedure(s) Performed: AN AD HOC LABOR EPIDURAL     Patient location during evaluation: Mother Baby Anesthesia Type: Epidural Level of consciousness: awake Pain management: satisfactory to patient Vital Signs Assessment: post-procedure vital signs reviewed and stable Respiratory status: spontaneous breathing Cardiovascular status: stable Anesthetic complications: no   No complications documented.  Last Vitals:  Vitals:   11/01/20 1436 11/01/20 1539  BP: (!) 142/84 (!) 133/91  Pulse: 91 (!) 101  Resp:  18  Temp: 36.9 C 36.9 C  SpO2: 99% 100%    Last Pain:  Vitals:   11/01/20 1539  TempSrc: Oral  PainSc:    Pain Goal: Patients Stated Pain Goal: 5 (11/01/20 0500)                 Cephus Shelling

## 2020-11-01 NOTE — Anesthesia Procedure Notes (Signed)
Epidural Patient location during procedure: OB Start time: 11/01/2020 5:20 AM End time: 11/01/2020 5:30 AM  Staffing Anesthesiologist: Mellody Dance, MD Performed: anesthesiologist   Preanesthetic Checklist Completed: patient identified, IV checked, site marked, risks and benefits discussed, monitors and equipment checked, pre-op evaluation and timeout performed  Epidural Patient position: sitting Prep: DuraPrep Patient monitoring: heart rate, cardiac monitor, continuous pulse ox and blood pressure Approach: midline Location: L2-L3 Injection technique: LOR saline  Needle:  Needle type: Tuohy  Needle gauge: 17 G Needle length: 9 cm Needle insertion depth: 5 cm Catheter type: closed end flexible Catheter size: 20 Guage Catheter at skin depth: 10 cm Test dose: negative and Other  Assessment Events: blood not aspirated, injection not painful, no injection resistance and negative IV test  Additional Notes Informed consent obtained prior to proceeding including risk of failure, 1% risk of PDPH, risk of minor discomfort and bruising.  Discussed rare but serious complications including epidural abscess, permanent nerve injury, epidural hematoma.  Discussed alternatives to epidural analgesia and patient desires to proceed.  Timeout performed pre-procedure verifying patient name, procedure, and platelet count.  Patient tolerated procedure well.

## 2020-11-01 NOTE — Progress Notes (Signed)
Faculty Note  In to meet patient, she is gradually feeling more pressure. Will with mild headache and blurry vision.  Reviewed plans for delivery, twins currently vtx/vtx, will assess position of 2nd twin after delivery of first twin. Reviewed possible need for breech extraction or CS if fetal intolerance, patient agreeable. Epidural working well.  SVE: 10/100/+2   A/P: 20 yo G1P0000 [redacted]w[redacted]d for IOL for cHTN with superimposed pre-eclampsia on mag. Now 10 cm, will labor down.  Anticipate SVD. [place] in room to assess 2nd twin position/FHR.   Baldemar Lenis, MD, [facility] Attending Center for [employer] [facility])

## 2020-11-02 LAB — CBC
HCT: 29.3 % — ABNORMAL LOW (ref 36.0–46.0)
Hemoglobin: 9.4 g/dL — ABNORMAL LOW (ref 12.0–15.0)
MCH: 31.9 pg (ref 26.0–34.0)
MCHC: 32.1 g/dL (ref 30.0–36.0)
MCV: 99.3 fL (ref 80.0–100.0)
Platelets: 189 10*3/uL (ref 150–400)
RBC: 2.95 MIL/uL — ABNORMAL LOW (ref 3.87–5.11)
RDW: 14.5 % (ref 11.5–15.5)
WBC: 10.4 10*3/uL (ref 4.0–10.5)
nRBC: 0 % (ref 0.0–0.2)

## 2020-11-02 MED ORDER — FUROSEMIDE 10 MG/ML IJ SOLN
10.0000 mg | Freq: Every day | INTRAMUSCULAR | Status: DC
  Administered 2020-11-02 – 2020-11-03 (×2): 10 mg via INTRAVENOUS
  Filled 2020-11-02 (×2): qty 2

## 2020-11-02 MED ORDER — NIFEDIPINE ER OSMOTIC RELEASE 60 MG PO TB24
60.0000 mg | ORAL_TABLET | Freq: Every day | ORAL | Status: DC
  Administered 2020-11-02 – 2020-11-03 (×2): 60 mg via ORAL
  Filled 2020-11-02 (×2): qty 1

## 2020-11-02 NOTE — Progress Notes (Signed)
Post Partum Day 1 s/p VD of di/di twins in the setting of CHTN with SI severe PEC, complicated by PPH.  Subjective: No complaints, voiding, tolerating PO and + flatus . Babies stable at bedside, formula feeding  Patient denies any headaches, visual symptoms, RUQ/epigastric pain or other concerning symptoms.  Objective: Blood pressure (!) 141/74, pulse 91, temperature 97.9 F (36.6 C), temperature source Oral, resp. rate 18, height 5\' 5"  (1.651 m), weight 92.2 kg, last menstrual period 02/22/2020, SpO2 100 %, unknown if currently breastfeeding.  Physical Exam:  General: alert and no distress Lochia: appropriate Uterine Fundus: firm, NT DVT Evaluation: No evidence of DVT seen on physical exam. Negative Homan's sign. 2+ BLE edema.  Recent Labs    11/01/20 0020 11/01/20 1109  HGB 10.6* 10.6*  HCT 33.0* 33.5*    Assessment/Plan: - Continue magnesium sulfate for 24 hours postpartum - Ordered for Procardia XL 60 mg po qd for now, may adjust regimen as needed - Also ordered for Lasix 10 mg IV daily given edema - Will follow up am labs and manage accordingly - Had a long discussion about contraception.  She desires outpatient progestin IUD. - Desires circumcision for her female infant, she will be consented for this later pending pediatrician evaluation of infant - Routine postpartum care   LOS: 2 days   11/03/20, MD 11/02/2020, 4:56 AM

## 2020-11-02 NOTE — Progress Notes (Signed)
CSW received consult for hx of Anxiety.  CSW met with MOB to offer support and complete assessment.    CSW met with MOB at bedside, MOB's mother was present. CSW introduced self, MOB granted CSW verbal permission to speak in front of her mother about anything. CSW explained reason for consult. MOB was welcoming, pleasant, open, and remained engaged during assessment. MOB's mother was also pleasant and engaged during assessment. CSW and MOB discussed MOB's mental health history. MOB reported that she was diagnosed with anxiety in 2020, MOB's mother added that it was during high school. MOB described her anxiety as being overwhelmed and excessive worry. MOB reported that she took medication in the past (Sertraline) which was helpful. MOB denied any current symptoms. MOB denied any additional mental health history. CSW inquired about MOB's coping skills, MOB reported that being alone and cooking. CSW inquired about how MOB was feeling emotionally after giving birth, MOB reported that she was feeling excited and overwhelmed but not in a bad way. CSW acknowledged and validated MOB's feelings. CSW inquired about MOB's support system, MOB reported that her mom, dad, FOB and sister are supports. MOB reported that she has all items needed to care for infants including 2 car seats and 2 cribs. CSW inquired about MOB's interest in community parenting education support programs, MOB initially reported interest then later declined after hearing about program. MOB presented calm and did not demonstrate any acute mental health signs/symptoms. CSW assessed for safety, MOB denied SI, HI and domestic violence. CSW informed MOB that she may be more suceptible to postpartum depression/anxiety due to her mental health history, MOB verbalized understanding.   CSW provided education regarding the baby blues period vs. perinatal mood disorders, discussed treatment and gave resources for mental health follow up if concerns arise.  CSW  recommends self-evaluation during the postpartum time period using the New Mom Checklist from Postpartum Progress and encouraged MOB to contact a medical professional if symptoms are noted at any time.    CSW provided review of Sudden Infant Death Syndrome (SIDS) precautions.    CSW identifies no further need for intervention and no barriers to discharge at this time.  Leea Rambeau, LCSW Clinical Social Worker [facility] Cell#: [phone] 

## 2020-11-03 MED ORDER — IBUPROFEN 600 MG PO TABS: 600.0000 mg | ORAL_TABLET | Freq: Four times a day (QID) | ORAL | 2 refills | Status: DC

## 2020-11-03 MED ORDER — OXYCODONE-ACETAMINOPHEN 5-325 MG PO TABS: 1.0000 | ORAL_TABLET | ORAL | 0 refills | Status: DC | PRN

## 2020-11-03 MED ORDER — NIFEDIPINE ER 60 MG PO TB24: 60.0000 mg | ORAL_TABLET | Freq: Every day | ORAL | 1 refills | Status: DC

## 2020-11-03 NOTE — Progress Notes (Signed)
Patient ID: Alexis [name], female   DOB: October 10, 2000, 20 y.o.   MRN: [id number] Attending Circumcision Counseling Progress Note  Patient desires circumcision for her female infant.  Circumcision procedure details discussed, risks and benefits of procedure were also discussed.  These include but are not limited to: Benefits of circumcision in men include reduction in the rates of urinary tract infection (UTI), penile cancer, some sexually transmitted infections, penile inflammatory and retractile disorders, as well as easier hygiene.  Risks include bleeding , infection, injury of glans which may lead to penile deformity or urinary tract issues, unsatisfactory cosmetic appearance and other potential complications related to the procedure.  It was emphasized that this is an elective procedure.  Patient wants to proceed with circumcision; written informed consent obtained.  Will do circumcision soon, routine circumcision and post circumcision care ordered for the infant.  Tiffine Henigan L. Alysia Penna, M.D. 11/03/2020 8:54 AM

## 2020-11-03 NOTE — Progress Notes (Signed)
Discharge instructions given to patient. Discussed follow up appointments for BP check, and postpartum check. Reviewed medication changes, postpartum care, signs and symptoms of hypertension. Patient verbalized understanding. Patient staying in room as baby patient.

## 2020-11-05 LAB — SURGICAL PATHOLOGY

## 2020-11-06 ENCOUNTER — Telehealth (HOSPITAL_COMMUNITY): Payer: Self-pay | Admitting: Cardiology

## 2020-11-06 NOTE — Telephone Encounter (Signed)
Just an FYI. We have made several attempts to contact this patient including sending a letter to schedule or reschedule their echocardiogram. We will be removing the patient from the echo WQ.  10/23/20 NO SHOWED - MAILED LETTE [facility]     Thank you

## 2020-11-07 ENCOUNTER — Ambulatory Visit: Payer: Medicaid Other

## 2020-11-08 ENCOUNTER — Ambulatory Visit: Payer: Medicaid Other

## 2020-11-08 ENCOUNTER — Ambulatory Visit (INDEPENDENT_AMBULATORY_CARE_PROVIDER_SITE_OTHER): Payer: Medicaid Other | Admitting: Obstetrics & Gynecology

## 2020-11-08 ENCOUNTER — Other Ambulatory Visit: Payer: Self-pay

## 2020-11-08 ENCOUNTER — Encounter: Payer: Medicaid Other | Admitting: Obstetrics & Gynecology

## 2020-11-08 ENCOUNTER — Encounter: Payer: Self-pay | Admitting: Obstetrics & Gynecology

## 2020-11-08 VITALS — BP 123/54 | HR 116 | Ht 65.0 in | Wt 164.0 lb

## 2020-11-08 DIAGNOSIS — N9089 Other specified noninflammatory disorders of vulva and perineum: Secondary | ICD-10-CM

## 2020-11-08 DIAGNOSIS — O165 Unspecified maternal hypertension, complicating the puerperium: Secondary | ICD-10-CM

## 2020-11-08 NOTE — Progress Notes (Signed)
History:  20 y.o. G1P0102 here today for PP BP check. She is on Nifedipine PP. She reports vaginal itching and some 'tags' that she wants evaluated.   The following portions of the patient's history were reviewed and updated as appropriate: allergies, current medications, past family history, past medical history, past social history, past surgical history and problem list.  Review of Systems:  Pertinent items are noted in HPI.    Objective:  Physical Exam Blood pressure (!) 123/54, pulse (!) 116, height 5\' 5"  (1.651 m), weight 164 lb (74.4 kg), last menstrual period 02/22/2020, not currently breastfeeding.  CONSTITUTIONAL: Well-developed, well-nourished female in no acute distress.  HENT:  Normocephalic, atraumatic EYES: Conjunctivae and EOM are normal. No scleral icterus.  NECK: Normal range of motion SKIN: Skin is warm and dry. No rash noted. Not diaphoretic.No pallor. NEUROLGIC: Alert and oriented to person, place, and time. Normal coordination.  Abd: Soft, nontender and nondistended Pelvic: Normal healing perineum post repair of perineal and periurethral lesion. No erythema or unusual lesions.     Assessment & Plan:  PP BP check - BP is WNL.   Will cont for Nifedipine for now.   Recheck BP in 2 weeks  Vulvar irritation  Normal sx of healing PP.   Selah Klang L. Harraway-Smith, M.D., 04/23/2020

## 2020-11-08 NOTE — Progress Notes (Signed)
Pt is here today s/p 1 week vaginal delivery of twins. She c/o vaginal itching and irritation. Also c/o possible skin tags on inner labia.

## 2020-11-15 NOTE — Progress Notes (Unsigned)
{Choose 1 Note Type (Telehealth Visit or Telephone Visit):[phone]}   Date:  11/15/2020   ID:  Alexis [name], DOB Oct 19, 2000, MRN [id number]  {Patient [phone]::"Home"} {Provider Location:[id number]::"Home Office"}  PCP:  Patient, No Pcp Per (Inactive)  Cardiologist:  None *** Electrophysiologist:  None   Evaluation Performed:  {Choose Visit Type:[id number]::"Follow-Up Visit"}  Chief Complaint:  ***  History of Present Illness:    Alexis [name] is a 20 y.o. female with a hx of tachycardia who presents for follow-up.  She was referred by Dr. Gust Rung for evaluation of chest pain, initially seen on 09/14/2020. She is currently [redacted] weeks pregnant. She has been having chest pain for 1 month and was recently seen in the ED 3 weeks ago for similar symptoms. She feels a sharp and stabbing pain in the center to left chest once every few days but none in the last few weeks. She note that it last for a few seconds and then resolves. It worsens with her taking deep breaths. She also has lightheadedness and shortness of breath during these episodes. She has palpitations once a day. She had COVID in January and she has since fully recovered. She denies having any complications with COVID. She has no syncope or LE edema. On 3/1 Hgb 8.4 down from 12.8 04/09/20. She was dx with iron deficiency anemia and has been receiving iron infusions her recent hemoglobin on 3/24 was 9.9. She has no smoking history.  No history of heart disease in her immediate family.  Zio patch x1 day on 10/01/20 showed no significant abnormalities.   Past Medical History:  Diagnosis Date  . Anxiety   . COVID-19   . Depression   . GERD (gastroesophageal reflux disease)   . Tachycardia    no medications-intermittent   Past Surgical History:  Procedure Laterality Date  . NO PAST SURGERIES       No outpatient medications have been marked as taking for the 11/16/20 encounter (Appointment) with  Little Ishikawa, MD.     Allergies:   Dust mite extract, Mixed ragweed, and Venofer [iron sucrose]   Social History   Tobacco Use  . Smoking status: Never Smoker  . Smokeless tobacco: Never Used  Vaping Use  . Vaping Use: Never used  Substance Use Topics  . Alcohol use: Not Currently  . Drug use: Not Currently     Family Hx: The patient's family history includes Diabetes Mellitus II in her mother; Hypertension in her father and mother; Stomach cancer in her maternal aunt and paternal grandmother.  ROS:   Please see the history of present illness.    *** All other systems reviewed and are negative.   Prior CV studies:   The following studies were reviewed today:  ***  Labs/Other Tests and Data Reviewed:    EKG:  {EKG/Telemetry Strips Reviewed:[phone]}  Recent Labs: 11/01/2020: ALT 14; BUN <5; Creatinine, Ser 0.66; Magnesium 5.3; Potassium 3.7; Sodium 134 11/02/2020: Hemoglobin 9.4; Platelets 189   Recent Lipid Panel Lab Results  Component Value Date/Time   CHOL 192 (H) 03/29/2019 03:01 PM   TRIG 99 (H) 03/29/2019 03:01 PM   HDL 57 03/29/2019 03:01 PM   LDLCALC 117 (H) 03/29/2019 03:01 PM    Wt Readings from Last 3 Encounters:  11/08/20 164 lb (74.4 kg) (89 %, Z= 1.24)*  10/31/20 203 lb 4.8 oz (92.2 kg) (98 %, Z= 1.99)*  10/31/20 203 lb (92.1 kg) (98 %, Z= 1.99)*   * Growth percentiles are based on  CDC (Girls, 2-20 Years) data.     Objective:    Vital Signs:  There were no vitals taken for this visit.   {[facility] Virtual Exam (Optional):[phone]::"VITAL SIGNS:  reviewed"}  ASSESSMENT & PLAN:    Dyspnea/chest pain: Reports worsening dyspnea over the last 6 weeks.  This occurred after COVID-19 infection.  Also having pleuritic chest pain, which seems to be improving, none in last 2 weeks.  However continues to be short of breath.  Recommend echocardiogram to rule out myocardial involvement from recent COVID-19 infection.  Echocardiogram ordered  but has not been done yet.  Palpitations: Zio patch x1 day on 10/01/20 showed no significant abnormalities.  RTC in 2 months  COVID-19 Education: The signs and symptoms of COVID-19 were discussed with the patient and how to seek care for testing (follow up with PCP or arrange E-visit).  ***The importance of social distancing was discussed today.  Time:   Today, I have spent *** minutes with the patient with telehealth technology discussing the above problems.     Medication Adjustments/Labs and Tests Ordered: Current medicines are reviewed at length with the patient today.  Concerns regarding medicines are outlined above.   Tests Ordered: No orders of the defined types were placed in this encounter.   Medication Changes: No orders of the defined types were placed in this encounter.   Follow Up:  {F/U Format:[phone]} {follow up:15908}  Signed, Little Ishikawa, MD  11/15/2020 10:17 PM    [facility]

## 2020-11-16 ENCOUNTER — Telehealth: Payer: Medicaid Other | Admitting: Cardiology

## 2020-11-20 ENCOUNTER — Ambulatory Visit: Payer: Medicaid Other | Admitting: Advanced Practice Midwife

## 2020-11-21 ENCOUNTER — Encounter (HOSPITAL_COMMUNITY): Payer: Self-pay | Admitting: Obstetrics and Gynecology

## 2020-11-21 ENCOUNTER — Other Ambulatory Visit: Payer: Self-pay

## 2020-11-21 ENCOUNTER — Inpatient Hospital Stay (HOSPITAL_COMMUNITY)
Admission: AD | Admit: 2020-11-21 | Discharge: 2020-11-21 | Disposition: A | Discharge status: Home / Self Care | Payer: Medicaid Other | Attending: Obstetrics and Gynecology | Admitting: Obstetrics and Gynecology

## 2020-11-21 DIAGNOSIS — N760 Acute vaginitis: Secondary | ICD-10-CM

## 2020-11-21 DIAGNOSIS — O1093 Unspecified pre-existing hypertension complicating the puerperium: Secondary | ICD-10-CM | POA: Diagnosis not present

## 2020-11-21 DIAGNOSIS — O8613 Vaginitis following delivery: Secondary | ICD-10-CM | POA: Insufficient documentation

## 2020-11-21 DIAGNOSIS — O99893 Other specified diseases and conditions complicating puerperium: Secondary | ICD-10-CM | POA: Diagnosis not present

## 2020-11-21 DIAGNOSIS — Z8616 Personal history of COVID-19: Secondary | ICD-10-CM | POA: Insufficient documentation

## 2020-11-21 DIAGNOSIS — Z79899 Other long term (current) drug therapy: Secondary | ICD-10-CM | POA: Diagnosis not present

## 2020-11-21 DIAGNOSIS — I10 Essential (primary) hypertension: Secondary | ICD-10-CM

## 2020-11-21 DIAGNOSIS — B9689 Other specified bacterial agents as the cause of diseases classified elsewhere: Secondary | ICD-10-CM | POA: Diagnosis not present

## 2020-11-21 DIAGNOSIS — O9089 Other complications of the puerperium, not elsewhere classified: Secondary | ICD-10-CM | POA: Diagnosis not present

## 2020-11-21 LAB — COMPREHENSIVE METABOLIC PANEL
ALT: 16 U/L (ref 0–44)
AST: 19 U/L (ref 15–41)
Albumin: 3.8 g/dL (ref 3.5–5.0)
Alkaline Phosphatase: 76 U/L (ref 38–126)
Anion gap: 7 (ref 5–15)
BUN: 7 mg/dL (ref 6–20)
CO2: 24 mmol/L (ref 22–32)
Calcium: 9 mg/dL (ref 8.9–10.3)
Chloride: 106 mmol/L (ref 98–111)
Creatinine, Ser: 0.8 mg/dL (ref 0.44–1.00)
GFR, Estimated: >60 mL/min (ref >60)
Glucose, Bld: 92 mg/dL (ref 70–99)
Potassium: 4 mmol/L (ref 3.5–5.1)
Sodium: 137 mmol/L (ref 135–145)
Total Bilirubin: 0.5 mg/dL (ref 0.3–1.2)
Total Protein: 6.8 g/dL (ref 6.5–8.1)

## 2020-11-21 LAB — URINALYSIS, ROUTINE W REFLEX MICROSCOPIC
Bilirubin Urine: NEGATIVE
Glucose, UA: NEGATIVE mg/dL
Hgb urine dipstick: NEGATIVE
Ketones, ur: NEGATIVE mg/dL
Nitrite: NEGATIVE
Protein, ur: NEGATIVE mg/dL
Specific Gravity, Urine: 1.008 (ref 1.005–1.030)
pH: 7 (ref 5.0–8.0)

## 2020-11-21 LAB — CBC WITH DIFFERENTIAL/PLATELET
Abs Immature Granulocytes: 0.01 10*3/uL (ref 0.00–0.07)
Basophils Absolute: 0 10*3/uL (ref 0.0–0.1)
Basophils Relative: 1 %
Eosinophils Absolute: 0.1 10*3/uL (ref 0.0–0.5)
Eosinophils Relative: 2 %
HCT: 38.1 % (ref 36.0–46.0)
Hemoglobin: 12.1 g/dL (ref 12.0–15.0)
Immature Granulocytes: 0 %
Lymphocytes Relative: 30 %
Lymphs Abs: 1.1 10*3/uL (ref 0.7–4.0)
MCH: 31.3 pg (ref 26.0–34.0)
MCHC: 31.8 g/dL (ref 30.0–36.0)
MCV: 98.4 fL (ref 80.0–100.0)
Monocytes Absolute: 0.3 10*3/uL (ref 0.1–1.0)
Monocytes Relative: 8 %
Neutro Abs: 2.2 10*3/uL (ref 1.7–7.7)
Neutrophils Relative %: 59 %
Platelets: 303 10*3/uL (ref 150–400)
RBC: 3.87 MIL/uL (ref 3.87–5.11)
RDW: 12.1 % (ref 11.5–15.5)
WBC: 3.7 10*3/uL — ABNORMAL LOW (ref 4.0–10.5)
nRBC: 0 % (ref 0.0–0.2)

## 2020-11-21 LAB — WET PREP, GENITAL
Sperm: NONE SEEN
Trich, Wet Prep: NONE SEEN
Yeast Wet Prep HPF POC: NONE SEEN

## 2020-11-21 MED ORDER — METRONIDAZOLE 500 MG PO TABS: 500.0000 mg | ORAL_TABLET | Freq: Two times a day (BID) | ORAL | 0 refills | Status: DC

## 2020-11-21 MED ORDER — ACETAMINOPHEN 500 MG PO TABS
1000.0000 mg | ORAL_TABLET | Freq: Once | ORAL | Status: AC
  Administered 2020-11-21: 1000 mg via ORAL
  Filled 2020-11-21: qty 2

## 2020-11-21 MED ORDER — NIFEDIPINE ER OSMOTIC RELEASE 30 MG PO TB24
60.0000 mg | ORAL_TABLET | Freq: Once | ORAL | Status: AC
  Administered 2020-11-21: 60 mg via ORAL
  Filled 2020-11-21: qty 2

## 2020-11-21 NOTE — MAU Provider Note (Addendum)
History   CSN: [id number]  Arrival date and time: 11/21/20 5329   Event Date/Time   First Provider Initiated Contact with Patient 11/21/20 [phone]     Chief Complaint  Patient presents with  . Leg Pain  . Vaginal pressure   HPI  Alexis [name] is a 20 y.o. [id number] with hx of cHTN  at [redacted]w[redacted]d post-partum following IOL for multiple gestations and superimposed preE at [redacted]w[redacted]d.   Her concerns today include vaginal pressure, vaginal itchiness, abdominal pain, leg cramping, and headache.   1. Vaginal Pressure  Reports pressure sensation started 5-6 days ago, feels like something is going to "fall out" when sitting. Has not seen any bulge in vagina. No issues with voiding 2. Vaginal itchiness  Reports extreme itchiness in perineal area, no associated swelling or erythema. No intercourse since delivery, bleeding from delivery stopped a few days ago. No new soaps or detergents. No fishy odor, but has noticed "strong" odor  3. Abdominal pain  Intermittent sharp, stabbing bilateral pain in lower quadrants, radiates downwards. Started 2 days ago, lasts 1 min and occurs about 8 times per day. Sometimes takes her breath away. Last BM yesterday AM. Not breastfeeding  4. Leg cramping  Last night woke up from BL pain in calves, states it feels like a charley horse. L calf pain has resolved but R calf pain still present, dull. Massaging calf area improves pain. Did not notice swelling, not hot to touch. Had similar pain during pregnancy around 25 weeks.  5. Headache Started yesterday, throbbing, global 6/10 pain. Denies change in vision, nausea, vomiting, and fever. Of note, elevated BP today (140/106, 130/100). Was prescribed Procardia 60 mg daily but no longer taking - states she was told she could stop taking it due to normal BP at PP visit on 5/26  She denies fever, nausea, vomiting, chest pain, shortness of breath, difficulty urinating, and changes in bowel habits.    OB History    Gravida  1    Para  1   Term  0   Preterm  1   AB  0   Living  2     SAB  0   IAB  0   Ectopic  0   Multiple  1   Live Births  2           Past Medical History:  Diagnosis Date  . Anxiety   . COVID-19   . Depression   . GERD (gastroesophageal reflux disease)   . Tachycardia    no medications-intermittent    Past Surgical History:  Procedure Laterality Date  . NO PAST SURGERIES      Family History  Problem Relation Age of Onset  . Diabetes Mellitus II Mother   . Hypertension Mother   . Hypertension Father   . Stomach cancer Maternal Aunt   . Stomach cancer Paternal Grandmother     Social History   Tobacco Use  . Smoking status: Never Smoker  . Smokeless tobacco: Never Used  Vaping Use  . Vaping Use: Never used  Substance Use Topics  . Alcohol use: Not Currently  . Drug use: Not Currently    Allergies:  Allergies  Allergen Reactions  . Dust Mite Extract Hives  . Mixed Ragweed Hives  . Venofer [Iron Sucrose] Other (See Comments)    Chest pain    Medications Prior to Admission  Medication Sig Dispense Refill Last Dose  . amoxicillin (AMOXIL) 500 MG capsule Take 1 capsule by  mouth 2 (two) times daily.     . cetirizine (ZYRTEC) 10 MG tablet Take 10 mg by mouth daily.     Marland Kitchen ibuprofen (ADVIL) 600 MG tablet Take 1 tablet (600 mg total) by mouth every 6 (six) hours. (Patient not taking: Reported on 11/08/2020) 30 tablet 2   . NIFEdipine (ADALAT CC) 60 MG 24 hr tablet Take 1 tablet (60 mg total) by mouth daily. 30 tablet 1   . oxyCODONE-acetaminophen (PERCOCET/ROXICET) 5-325 MG tablet Take 1 tablet by mouth every 4 (four) hours as needed (for pain scale greater than or equal to 4 and less than 7). (Patient not taking: Reported on 11/08/2020) 24 tablet 0   . Prenatal Vit-Fe Fumarate-FA (PRENATAL VITAMINS PO) Take 1 tablet by mouth daily.     . sertraline (ZOLOFT) 50 MG tablet TAKE 1 TABLET BY MOUTH EVERY DAY (Patient not taking: Reported on 11/08/2020) 90 tablet 2      Review of Systems  Constitutional: Positive for appetite change. Negative for fever.  HENT: Negative for rhinorrhea and sore throat.   Eyes: Negative for photophobia and visual disturbance.  Respiratory: Negative for cough and shortness of breath.   Cardiovascular: Negative for chest pain.  Gastrointestinal: Positive for abdominal pain. Negative for constipation, diarrhea, nausea and vomiting.  Genitourinary: Negative for difficulty urinating, dysuria, hematuria, urgency and vaginal bleeding.  Musculoskeletal: Positive for myalgias.  Neurological: Positive for headaches. Negative for dizziness and light-headedness.  Psychiatric/Behavioral: Negative for agitation, behavioral problems and hallucinations.   Physical Exam   Blood pressure (!) 136/100, pulse 81, temperature 99.5 F (37.5 C), temperature source Oral, resp. rate 16, SpO2 100 %, not currently breastfeeding.  Temp:  [98.6 F (37 C)-99.5 F (37.5 C)] 98.6 F (37 C) (06/08 1229) Pulse Rate:  [66-84] 77 (06/08 1229) Resp:  [14-16] 14 (06/08 1229) BP: (131-142)/(77-106) 134/77 (06/08 1229) SpO2:  [99 %-100 %] 100 % (06/08 1229)  Physical Exam Exam conducted with a chaperone present.  HENT:     Head: Normocephalic and atraumatic.     Nose: Nose normal.  Eyes:     Conjunctiva/sclera: Conjunctivae normal.  Cardiovascular:     Rate and Rhythm: Normal rate.  Pulmonary:     Effort: Pulmonary effort is normal.  Abdominal:     General: Abdomen is flat. There is no distension.     Palpations: Abdomen is soft.     Tenderness: There is abdominal tenderness in the right lower quadrant. There is no guarding or rebound.  Genitourinary:    General: Normal vulva.     Urethra: No prolapse or urethral swelling.     Vagina: No lesions.     Cervix: Discharge present. No friability, erythema or cervical bleeding.     Uterus: Normal. No uterine prolapse.      Comments: 2nd degree perineal laceration healing well, repair intact.  White, homogenous cervical discharge.  Musculoskeletal:     Cervical back: Normal range of motion.     Right lower leg: Tenderness present. No swelling or deformity. No edema.     Left lower leg: No swelling, deformity or tenderness. No edema.     Right ankle: Normal.     Left ankle: Normal.     Right foot: Normal.     Left foot: Normal.  Skin:    General: Skin is warm and dry.  Neurological:     General: No focal deficit present.     Mental Status: She is alert and oriented to person, place, and time.  Psychiatric:        Mood and Affect: Mood normal.        Behavior: Behavior normal.        Thought Content: Thought content normal.        Judgment: Judgment normal.     MAU Course  Procedures   Orders Placed This Encounter  Procedures  . Wet prep, genital    Standing Status:   Standing    Number of Occurrences:   1  . Urine Culture    Standing Status:   Standing    Number of Occurrences:   1  . CBC with Differential/Platelet    Standing Status:   Standing    Number of Occurrences:   1  . Comprehensive metabolic panel    Standing Status:   Standing    Number of Occurrences:   1  . Urinalysis, Routine w reflex microscopic Urine, Clean Catch    Standing Status:   Standing    Number of Occurrences:   1    Lab Results  Results for orders placed or performed during the hospital encounter of 11/21/20 (from the past 24 hour(s))  CBC with Differential/Platelet     Status: Abnormal   Collection Time: 11/21/20 10:18 AM  Result Value Ref Range   WBC 3.7 (L) 4.0 - 10.5 K/uL   RBC 3.87 3.87 - 5.11 MIL/uL   Hemoglobin 12.1 12.0 - 15.0 g/dL   HCT 19.1 47.8 - 29.5 %   MCV 98.4 80.0 - 100.0 fL   MCH 31.3 26.0 - 34.0 pg   MCHC 31.8 30.0 - 36.0 g/dL   RDW 62.1 30.8 - 65.7 %   Platelets 303 150 - 400 K/uL   nRBC 0.0 0.0 - 0.2 %   Neutrophils Relative % 59 %   Neutro Abs 2.2 1.7 - 7.7 K/uL   Lymphocytes Relative 30 %   Lymphs Abs 1.1 0.7 - 4.0 K/uL   Monocytes Relative 8 %    Monocytes Absolute 0.3 0.1 - 1.0 K/uL   Eosinophils Relative 2 %   Eosinophils Absolute 0.1 0.0 - 0.5 K/uL   Basophils Relative 1 %   Basophils Absolute 0.0 0.0 - 0.1 K/uL   Immature Granulocytes 0 %   Abs Immature Granulocytes 0.01 0.00 - 0.07 K/uL  Comprehensive metabolic panel     Status: None   Collection Time: 11/21/20 10:18 AM  Result Value Ref Range   Sodium 137 135 - 145 mmol/L   Potassium 4.0 3.5 - 5.1 mmol/L   Chloride 106 98 - 111 mmol/L   CO2 24 22 - 32 mmol/L   Glucose, Bld 92 70 - 99 mg/dL   BUN 7 6 - 20 mg/dL   Creatinine, Ser 8.46 0.44 - 1.00 mg/dL   Calcium 9.0 8.9 - 96.2 mg/dL   Total Protein 6.8 6.5 - 8.1 g/dL   Albumin 3.8 3.5 - 5.0 g/dL   AST 19 15 - 41 U/L   ALT 16 0 - 44 U/L   Alkaline Phosphatase 76 38 - 126 U/L   Total Bilirubin 0.5 0.3 - 1.2 mg/dL   GFR, Estimated >95 >28 mL/min   Anion gap 7 5 - 15  Wet prep, genital     Status: Abnormal   Collection Time: 11/21/20 10:45 AM   Specimen: PATH Cytology Cervicovaginal Ancillary Only  Result Value Ref Range   Yeast Wet Prep HPF POC NONE SEEN NONE SEEN   Trich, Wet Prep NONE SEEN NONE SEEN  Clue Cells Wet Prep HPF POC PRESENT (A) NONE SEEN   WBC, Wet Prep HPF POC MANY (A) NONE SEEN   Sperm NONE SEEN   Urinalysis, Routine w reflex microscopic Urine, Clean Catch     Status: Abnormal   Collection Time: 11/21/20 10:45 AM  Result Value Ref Range   Color, Urine YELLOW YELLOW   APPearance HAZY (A) CLEAR   Specific Gravity, Urine 1.008 1.005 - 1.030   pH 7.0 5.0 - 8.0   Glucose, UA NEGATIVE NEGATIVE mg/dL   Hgb urine dipstick NEGATIVE NEGATIVE   Bilirubin Urine NEGATIVE NEGATIVE   Ketones, ur NEGATIVE NEGATIVE mg/dL   Protein, ur NEGATIVE NEGATIVE mg/dL   Nitrite NEGATIVE NEGATIVE   Leukocytes,Ua LARGE (A) NEGATIVE   RBC / HPF 0-5 0 - 5 RBC/hpf   WBC, UA 21-50 0 - 5 WBC/hpf   Bacteria, UA FEW (A) NONE SEEN   Squamous Epithelial / LPF 6-10 0 - 5    Medical Decision Making  Differential diagnosis  includes UTI, cervicitis, pelvic organ prolapse, DVT, vaginal atrophy, PP preE, and appendicitis.   -CBC w/ diff to assess potential infection, appendicitis, preE, and anemia; WBC 3.7, Hgb 12.1, Platelets 303 -CMP to check for electrolyte imbalances; wnl  -UA, Wet prep/GC swabs, and pelvic exam with chaperone for vaginal pruritis and vaginal pressure to check for UTI, cervicitis, or prolapse. Wet prep showing clue cells, many WBC. UA with large leukocytes and many bacteria. GC swab pending.  -Given Procardia 60 mg for multiple elevated BP (140/106, 136/100, 142/92). Counseled patient to restart Procardia daily  -Given Tylenol for headache, which improved significantly   -Rx Flagyl for BV  -Urine culture pending following UA results   Assessment and Plan  Alexis [name] is a 20 y.o. G1P0102 with hx of cHTN and preE at [redacted]w[redacted]d PP presenting for vaginal pressure, vaginal itchiness, abdominal pain, leg cramping, and headache.  Vaginal Pressure No evidence of prolapse on pelvic exam. Urine culture pending.    Vaginal Itchiness  Itchiness at perineal region where 2nd degree laceration is healing. Patient reports strong odor. Wet prep with clue cells, many WBCs. UA with large leukocyte esterase and many bacteria. Will Rx Flagyl for BV and order urine culture.   Abdominal pain  Most likely is uterus contracting/decreasing size. WBC of 3.7, no rebound tenderness or guarding on abdominal exam. Patient endorses suprapubic and RLQ pressure as opposed to pain, appendicitis unlikely.    Leg cramping Patient is in a hypercoagulable state due to immediate postpartum period but DVT unlikely due to benign exam and BL onset of pain. No associated swelling/pitting edema, erythema, cyanosis, or warmth. No cough, chest pain, or SOB. No fever or tachycardia. Palpable distal pulses. Pain improves with massage, most consistent with muscle cramp/charley horse.   Headache Negative workup for preE with severe  features. No changes in vision, nausea, or vomiting. Normal platelets of 303.   Patient discharged home in stable condition.   Orland Dec 11/21/2020, 9:34 AM      Attestation of Supervision of Student:  I confirm that I have verified the information documented in the medical student's note and that I have also personally performed the history, physical exam and all medical decision making activities.  I have verified that all services and findings are accurately documented in this student's note; and I agree with management and plan as outlined in the documentation. I have also made any necessary editorial changes.   Patient is nearly 3 weeks  postpartum with multiple complaints.  She has chronic hypertension but stopped taking her nifedipine because she thought her ob told her not to take it. Last dose was nearly 2 weeks ago. Reports headache today. Hasn't treated symptoms. Denies visual disturbance or epigastric pain.  Also reports abdominal cramping, vaginal pressure, and vaginal itching. Denies vaginal bleeding, abnormal discharge, fever, n/v/d, dysuria. Has not had intercourse since delivery.   ROS: Review of Systems - History obtained from chart review General ROS: negative Gastrointestinal ROS: positive for - abdominal cramping Genito-Urinary ROS: positive for vaginal pressure & itching. Negative for bleeding, discharge, or urinary symptoms Neurological ROS: negative   Exam: Patient Vitals for the past 24 hrs:  BP Temp Temp src Pulse Resp SpO2  11/21/20 1229 134/77 98.6 F (37 C) Oral 77 14 100 %  11/21/20 1015 (!) 134/99 -- -- 75 -- 99 %  11/21/20 1000 131/82 -- -- 66 -- 100 %  11/21/20 0945 (!) 142/92 -- -- 81 -- 100 %  11/21/20 0930 139/82 -- -- 68 -- 100 %  11/21/20 0915 (!) 136/100 -- -- 81 -- 100 %  11/21/20 0855 (!) 140/106 99.5 F (37.5 C) Oral 84 16 100 %   Physical Examination: General appearance - alert, well appearing, and in no distress Mental status -  normal mood, behavior, speech, dress, motor activity, and thought processes Eyes - sclera anicteric Chest - clear to auscultation, no wheezes, rales or rhonchi, symmetric air entry Heart - normal rate, regular rhythm, normal S1, S2, no murmurs, rubs, clicks or gallops Abdomen - soft, nontender, nondistended, no masses or organomegaly Pelvic - no bleeding or abnormal discharge. Vaginal repair intact  MDM Patient has elevated BPs. None severe range. Preeclampsia labs are normal. Suspect exacerbation of chronic hypertension since patient has not been taking her medication. Will restart nifedipine. Headache improved with tylenol.   Pelvic exam normal. No pelvic organ prolapse, vaginal bleeding, or abnormal discharge. Palpated vaginal laceration which is well healing - patient reports this is where her itching is. Suspect itching is related to the healing process.   Wet prep positive for clue cells -pt desires treatment  A/P: 1. Chronic hypertension   2. Postpartum perineal pain   3. BV (bacterial vaginosis)    -reviewed reasons to return to MAU -restart nifedipine - first dose given in MAU -Rx flagyl  Judeth HornLawrence, Irlanda Croghan, NP     Judeth HornErin Shamus Desantis, NP [facility] 11/21/2020 6:26 PM

## 2020-11-21 NOTE — Discharge Instructions (Signed)

## 2020-11-21 NOTE — MAU Note (Signed)
Alexis [name] is a 20 y.o. here in MAU reporting: s/p SVD on 11/01/20, states she had twins. Had a 2nd degree tear. For the past few days has been having a lot of vaginal pressure and feels like something is going to fall out. Ongoing abdominal pain. Last night had pain in right leg and is still feeling that pain, states it is in her calf.   Onset of complaint: ongoing  Pain score: 6/10  Vitals:   11/21/20 0855  BP: (!) 140/106  Pulse: 84  Resp: 16  Temp: 99.5 F (37.5 C)  SpO2: 100%     Lab orders placed from triage: none

## 2020-11-22 LAB — GC/CHLAMYDIA PROBE AMP (~~LOC~~) NOT AT ARMC
Chlamydia: NEGATIVE
Comment: NEGATIVE
Comment: NORMAL
Neisseria Gonorrhea: NEGATIVE

## 2020-11-22 LAB — URINE CULTURE

## 2020-11-30 ENCOUNTER — Other Ambulatory Visit: Payer: Self-pay

## 2020-11-30 ENCOUNTER — Inpatient Hospital Stay (HOSPITAL_COMMUNITY)
Admission: AD | Admit: 2020-11-30 | Discharge: 2020-11-30 | Disposition: A | Discharge status: Home / Self Care | Payer: Medicaid Other | Attending: Family Medicine | Admitting: Family Medicine

## 2020-11-30 ENCOUNTER — Encounter (HOSPITAL_COMMUNITY): Payer: Self-pay | Admitting: Family Medicine

## 2020-11-30 DIAGNOSIS — R079 Chest pain, unspecified: Secondary | ICD-10-CM | POA: Diagnosis not present

## 2020-11-30 DIAGNOSIS — Z91128 Patient's intentional underdosing of medication regimen for other reason: Secondary | ICD-10-CM | POA: Diagnosis not present

## 2020-11-30 DIAGNOSIS — R0602 Shortness of breath: Secondary | ICD-10-CM | POA: Insufficient documentation

## 2020-11-30 DIAGNOSIS — F419 Anxiety disorder, unspecified: Secondary | ICD-10-CM | POA: Insufficient documentation

## 2020-11-30 DIAGNOSIS — R1110 Vomiting, unspecified: Secondary | ICD-10-CM | POA: Insufficient documentation

## 2020-11-30 DIAGNOSIS — R109 Unspecified abdominal pain: Secondary | ICD-10-CM | POA: Insufficient documentation

## 2020-11-30 DIAGNOSIS — O9081 Anemia of the puerperium: Secondary | ICD-10-CM | POA: Insufficient documentation

## 2020-11-30 DIAGNOSIS — R0789 Other chest pain: Secondary | ICD-10-CM

## 2020-11-30 DIAGNOSIS — R519 Headache, unspecified: Secondary | ICD-10-CM | POA: Insufficient documentation

## 2020-11-30 DIAGNOSIS — O99893 Other specified diseases and conditions complicating puerperium: Secondary | ICD-10-CM | POA: Diagnosis not present

## 2020-11-30 DIAGNOSIS — Z79899 Other long term (current) drug therapy: Secondary | ICD-10-CM | POA: Diagnosis not present

## 2020-11-30 DIAGNOSIS — T43226A Underdosing of selective serotonin reuptake inhibitors, initial encounter: Secondary | ICD-10-CM | POA: Insufficient documentation

## 2020-11-30 DIAGNOSIS — Z20822 Contact with and (suspected) exposure to covid-19: Secondary | ICD-10-CM | POA: Diagnosis not present

## 2020-11-30 DIAGNOSIS — O165 Unspecified maternal hypertension, complicating the puerperium: Secondary | ICD-10-CM | POA: Insufficient documentation

## 2020-11-30 DIAGNOSIS — O9089 Other complications of the puerperium, not elsewhere classified: Secondary | ICD-10-CM | POA: Diagnosis not present

## 2020-11-30 DIAGNOSIS — O99345 Other mental disorders complicating the puerperium: Secondary | ICD-10-CM

## 2020-11-30 LAB — CBC
HCT: 36.9 % (ref 36.0–46.0)
Hemoglobin: 11.9 g/dL — ABNORMAL LOW (ref 12.0–15.0)
MCH: 31.3 pg (ref 26.0–34.0)
MCHC: 32.2 g/dL (ref 30.0–36.0)
MCV: 97.1 fL (ref 80.0–100.0)
Platelets: 250 10*3/uL (ref 150–400)
RBC: 3.8 MIL/uL — ABNORMAL LOW (ref 3.87–5.11)
RDW: 11.9 % (ref 11.5–15.5)
WBC: 4.8 10*3/uL (ref 4.0–10.5)
nRBC: 0 % (ref 0.0–0.2)

## 2020-11-30 LAB — COMPREHENSIVE METABOLIC PANEL
ALT: 14 U/L (ref 0–44)
AST: 21 U/L (ref 15–41)
Albumin: 3.8 g/dL (ref 3.5–5.0)
Alkaline Phosphatase: 68 U/L (ref 38–126)
Anion gap: 5 (ref 5–15)
BUN: 5 mg/dL — ABNORMAL LOW (ref 6–20)
CO2: 25 mmol/L (ref 22–32)
Calcium: 8.9 mg/dL (ref 8.9–10.3)
Chloride: 110 mmol/L (ref 98–111)
Creatinine, Ser: 0.77 mg/dL (ref 0.44–1.00)
GFR, Estimated: >60 mL/min (ref >60)
Glucose, Bld: 104 mg/dL — ABNORMAL HIGH (ref 70–99)
Potassium: 4.1 mmol/L (ref 3.5–5.1)
Sodium: 140 mmol/L (ref 135–145)
Total Bilirubin: 0.5 mg/dL (ref 0.3–1.2)
Total Protein: 6.7 g/dL (ref 6.5–8.1)

## 2020-11-30 LAB — RESP PANEL BY RT-PCR (FLU A&B, COVID) ARPGX2
Influenza A by PCR: NEGATIVE
Influenza B by PCR: NEGATIVE
SARS Coronavirus 2 by RT PCR: NEGATIVE

## 2020-11-30 LAB — TROPONIN I (HIGH SENSITIVITY): Troponin I (High Sensitivity): <2 ng/L (ref <18)

## 2020-11-30 LAB — BRAIN NATRIURETIC PEPTIDE: B Natriuretic Peptide: 17 pg/mL (ref 0.0–100.0)

## 2020-11-30 LAB — D-DIMER, QUANTITATIVE: D-Dimer, Quant: 0.27 ug/mL-FEU (ref 0.00–0.50)

## 2020-11-30 MED ORDER — CYCLOBENZAPRINE HCL 5 MG PO TABS: 5.0000 mg | ORAL_TABLET | Freq: Three times a day (TID) | ORAL | 0 refills | Status: DC | PRN

## 2020-11-30 MED ORDER — ACETAMINOPHEN 500 MG PO TABS
1000.0000 mg | ORAL_TABLET | Freq: Once | ORAL | Status: AC
  Administered 2020-11-30: 1000 mg via ORAL
  Filled 2020-11-30: qty 2

## 2020-11-30 MED ORDER — BUTALBITAL-APAP-CAFFEINE 50-325-40 MG PO TABS
1.0000 | ORAL_TABLET | Freq: Once | ORAL | Status: AC
  Administered 2020-11-30: 1 via ORAL
  Filled 2020-11-30: qty 1

## 2020-11-30 MED ORDER — HYDROXYZINE HCL 25 MG PO TABS: 25.0000 mg | ORAL_TABLET | Freq: Three times a day (TID) | ORAL | 1 refills | Status: DC | PRN

## 2020-11-30 MED ORDER — ACETAMINOPHEN 500 MG PO TABS: 1000.0000 mg | ORAL_TABLET | Freq: Three times a day (TID) | ORAL | 0 refills | Status: DC | PRN

## 2020-11-30 NOTE — MAU Provider Note (Signed)
History     CSN: [id number]  Arrival date and time: 11/30/20 1827   Event Date/Time   First Provider Initiated Contact with Patient 11/30/20 1923      Chief Complaint  Patient presents with   Abdominal Pain   Chest Pain   Headache   HPI Alexis [name] is a 20 year old G1P0102 who is PPD#29 s/p preterm vaginal delivery of didi twins.She was admitted for IOL secondary to [facility] with severe superimposed preeclampsia. She was discharged on nifedipine 60 mg daily. Pt was seen on 11/08/20, at which time her blood pressure was well controlled.   She presents to MAU today given concern of severe headache rated 10/10, not relieved with ibuprofen or tylenol. In addition, she has new onset SOB and pain with inspiration. Pt also endorses chest pain described as heaviness like "someone is sitting on my chest". Pt also reports emesis x3, lightheadedness and dizziness. Pain in throat but no cough or congestion. She is taking her nifedipine with good adherence (last dose this morning). No concern of leg pain or swelling. No known sick contacts. No fever, chills, vision changes, photophobia, vaginal pain, vaginal bleeding, or dysuria.  Past Medical History:  Diagnosis Date   Anxiety    COVID-19    Depression    GERD (gastroesophageal reflux disease)    Tachycardia    no medications-intermittent    Past Surgical History:  Procedure Laterality Date   NO PAST SURGERIES      Family History  Problem Relation Age of Onset   Diabetes Mellitus II Mother    Hypertension Mother    Hypertension Father    Stomach cancer Maternal Aunt    Stomach cancer Paternal Grandmother     Social History   Tobacco Use   Smoking status: Never   Smokeless tobacco: Never  Vaping Use   Vaping Use: Never used  Substance Use Topics   Alcohol use: Not Currently   Drug use: Not Currently    Allergies:  Allergies  Allergen Reactions   Dust Mite Extract Hives   Mixed Ragweed Hives   Venofer [Iron Sucrose]  Other (See Comments)    Chest pain    Medications Prior to Admission  Medication Sig Dispense Refill Last Dose   cetirizine (ZYRTEC) 10 MG tablet Take 10 mg by mouth daily.   Past Week   NIFEdipine (ADALAT CC) 60 MG 24 hr tablet Take 1 tablet (60 mg total) by mouth daily. 30 tablet 1 11/30/2020   pantoprazole (PROTONIX) 40 MG tablet Take 40 mg by mouth daily.   11/30/2020   ibuprofen (ADVIL) 600 MG tablet Take 1 tablet (600 mg total) by mouth every 6 (six) hours. (Patient not taking: Reported on 11/08/2020) 30 tablet 2    metroNIDAZOLE (FLAGYL) 500 MG tablet Take 1 tablet (500 mg total) by mouth 2 (two) times daily. 14 tablet 0    Prenatal Vit-Fe Fumarate-FA (PRENATAL VITAMINS PO) Take 1 tablet by mouth daily.       Review of Systems  Constitutional:  Negative for chills and fever.  HENT:  Negative for congestion and sore throat.   Eyes:  Negative for photophobia and visual disturbance.  Respiratory:  Positive for chest tightness and shortness of breath. Negative for cough.   Cardiovascular:  Positive for chest pain. Negative for palpitations and leg swelling.  Gastrointestinal:  Positive for abdominal pain, nausea and vomiting. Negative for diarrhea.  Genitourinary:  Negative for dysuria, vaginal bleeding, vaginal discharge and vaginal pain.  Musculoskeletal:  Negative  for neck pain and neck stiffness.  Neurological:  Positive for dizziness, light-headedness and headaches. Negative for seizures, syncope and weakness.   Physical Exam   Blood pressure 136/85, pulse 99, temperature 98.8 F (37.1 C), temperature source Oral, resp. rate 16, height 5\' 5"  (1.651 m), weight 78 kg, SpO2 100 %, not currently breastfeeding.  Physical Exam Gen: alert, well-appearing, NAD. Lying comfortably on stretcher. Head: atraumatic HEENT: moist mucous membranes Chest: non-tender to palpation Breast: no areas of firmness or redness Cardiac: normal rate and rhythm.  Resp: normal WOB, CTAB Abd: soft,  non-distended, non-tender to palpation Neuro: CN II-XII intact. No focal neurologic deficits. Intact sensation and strength in distal extremities Psych: appropriate affect and behavior. Endorses anxiety but denies SI, HI and thoughts of self harm. Ext: no LE edema or tenderness. No palpable cords.  MAU Course   Fioricet administered at 2003 given report of 10/10 headache. Reassuringly, normal blood pressure on arrival.   CBC, CMP, pro-BNP and d-dimer ordered to evaluate for signs of severe preeclampsia, volume overload and PE/DVT.  EKG, CBC, CMP, pro-BNP, troponin, and d-dimer all within normal limits. COVID test also negative.  Pt reports improvement in headache from 10/10 >6/10 s/p fioricet >3-4/10 s/p additional tylenol 1000mg .  Assessment and Plan   Alexis [name] is a 20 year old G1P0102 who is PPD#29 s/p preterm vaginal delivery of didi twins.She was admitted for IOL secondary to [facility] with severe superimposed preeclampsia. She presents to MAU today given severe HA, chest pain described as pressure and shortness of breath.  Postpartum Headache: Most consistent with tension headache in the setting of limited sleep in postpartum period. Unlikely migraine given no associated vision changes or photophobia. No imaging performed given normal neuro exam and near-resolution of HA with fioricet and tylenol. Emphasized importance of sleep hygiene, improved hydration, and use of prn tylenol 1000mg  every 6-8 hours prn avoid continued ibuprofen given risk of rebound HA). Also recommended massage and heating pads. Provided script for flexeril 5mg  every 8 hours for muscle tightness. Provided strict return precautions for red flag symptoms. Chest Pain: Pt reports pressure-like chest pain. Unlikely ACS or heart strain given normal EKG, pro-BNP and troponin. Unlikely PE given negative d-dimer. Most consistent with anxiety. Provided reassurance and script for hydroxyzine as noted below. cHTN with superimposed  Severe Preeclampsia: S/p IOL for severe PreE. Pt received 24 hours of postpartum magnesium prior to discharge. Continued on nifedipine 60mg  daily since discharge with good adherence. Pt with normal blood pressure in MAU today. Repeat preeclampsia labs wnl except for mild anemia. Emphasized importance of continued nifedipine 60mg  daily. Plan for close follow-up for prenatal visit on 12/13/20. Strict return precautions provided. Abdominal Pain: Described as intermittent cramping. Endometritis unlikely given lack of fever and GU symptoms. Most likely constipation given minimal hydration. Provided reassurance and recommended good hydration and miralax daily as needed. Anxiety: Pt previously on SSRI but self discontinued during pregnancy. Discussed anxiety may be contributing to symptoms as noted above. No safety concerns today. Provided script for hydroxyzine 25mg  every 8 hours prn. Pt declines BH f/u at this time. Provided written BH resources and encouraged f/u as needed for safety concerns or worsening anxiety.  [facility] 11/30/2020, 8:44 PM

## 2020-11-30 NOTE — MAU Note (Signed)
Alexis [name] is a 19 y.o. here in MAU reporting: s/p twin SVD on 11/01/2020. Since yesterday has had a headache that has not been relieved with ibuprofen or tylenol. Today started having SOB and pain with inspiration. Also reporting RUQ pain.   Onset of complaint: today  Pain score: headache 10/10, RUQ 7/10, chest 7/10  Vitals:   11/30/20 1849  BP: 125/84  Pulse: (!) 107  Resp: 16  Temp: 98.8 F (37.1 C)  SpO2: 100%     Lab orders placed from triage: none

## 2020-12-07 ENCOUNTER — Other Ambulatory Visit: Payer: Self-pay

## 2020-12-07 ENCOUNTER — Ambulatory Visit (INDEPENDENT_AMBULATORY_CARE_PROVIDER_SITE_OTHER): Payer: Medicaid Other | Admitting: Family Medicine

## 2020-12-07 DIAGNOSIS — I10 Essential (primary) hypertension: Secondary | ICD-10-CM | POA: Diagnosis not present

## 2020-12-07 NOTE — Progress Notes (Signed)
Post Partum Visit Note  Alexis [name] is a 20 y.o. [id number] female who presents for a postpartum visit. She is 5 weeks postpartum following a normal spontaneous vaginal delivery.  I have fully reviewed the prenatal and intrapartum course. The delivery was at 36 gestational weeks.  Anesthesia: epidural. Postpartum course has been normal. Baby is doing well. Baby is feeding by bottle -   . Bleeding . Bowel function is normal. Bladder function is normal. Patient is not sexually active. Contraception method is IUD. Postpartum depression screening: negative.   Edinburgh Postnatal Depression Scale - 12/07/20 1056       Edinburgh Postnatal Depression Scale:  In the Past 7 Days   I have been able to laugh and see the funny side of things. 0    I have looked forward with enjoyment to things. 0    I have blamed myself unnecessarily when things went wrong. 1    I have been anxious or worried for no good reason. 1    I have felt scared or panicky for no good reason. 0    Things have been getting on top of me. 1    I have been so unhappy that I have had difficulty sleeping. 0    I have felt sad or miserable. 0    I have been so unhappy that I have been crying. 0    The thought of harming myself has occurred to me. 0    Edinburgh Postnatal Depression Scale Total 3               Health Maintenance Due  Topic Date Due   HPV VACCINES (1 - 2-dose series) Never done    The following portions of the patient's history were reviewed and updated as appropriate: allergies, current medications, past family history, past medical history, past social history, past surgical history, and problem list.  Review of Systems Pertinent items are noted in HPI.  Objective:  BP 120/81   Pulse (!) 102   Wt 168 lb (76.2 kg)   LMP 12/06/2020   Breastfeeding No   BMI 27.96 kg/m    General:  alert, cooperative, and no distress  Lungs: clear to auscultation bilaterally  Heart:  regular rate and  rhythm, S1, S2 normal, no murmur, click, rub or gallop  Abdomen: soft, non-tender; bowel sounds normal; no masses,  no organomegaly        Assessment:   1. Postpartum exam   2. Chronic hypertension      Plan:   Essential components of care per ACOG recommendations:  1.  Mood and well being: Patient with negative depression screening today. Reviewed local resources for support.  - hx of drug use? No.    2. Infant care and feeding:  -Patient currently breastmilk feeding? No.  -Social determinants of health (SDOH) reviewed in EPIC. No concerns  3. Sexuality, contraception and birth spacing - Patient does not want a pregnancy in the next year.   - Reviewed forms of contraception in tiered fashion. Patient desired IUD. Will return for it - Discussed birth spacing of 18 months  4. Sleep and fatigue -Encouraged family/partner/community support of 4 hrs of uninterrupted sleep to help with mood and fatigue  5. Physical Recovery  - Discussed patients delivery and complications. She describes her labor as good. - Patient had a Vaginal, no problems at delivery. Patient had a 2nd degree laceration. Perineal healing reviewed. Patient expressed understanding - Patient has urinary incontinence? No. -  Patient is safe to resume physical and sexual activity  6.  Health Maintenance - HM due items addressed No -   - Last pap smear No results found for: DIAGPAP Pap smear not done at today's visit.  -Breast Cancer screening indicated? No.   7. Chronic Disease/Pregnancy Condition follow up: Hypertension - not on medication  - PCP follow up  [name], DO [facility] [employer], [facility]

## 2020-12-13 ENCOUNTER — Ambulatory Visit: Payer: Medicaid Other | Admitting: Family Medicine

## 2020-12-20 ENCOUNTER — Ambulatory Visit: Payer: Medicaid Other | Admitting: Family Medicine

## 2020-12-31 ENCOUNTER — Other Ambulatory Visit: Payer: Self-pay | Admitting: Obstetrics & Gynecology

## 2021-01-16 ENCOUNTER — Ambulatory Visit: Payer: Medicaid Other

## 2021-01-16 ENCOUNTER — Other Ambulatory Visit (HOSPITAL_COMMUNITY)
Admission: RE | Admit: 2021-01-16 | Discharge: 2021-01-16 | Disposition: A | Discharge status: Home / Self Care | Payer: Medicaid Other | Source: Ambulatory Visit | Attending: Obstetrics & Gynecology | Admitting: Obstetrics & Gynecology

## 2021-01-16 ENCOUNTER — Other Ambulatory Visit: Payer: Self-pay

## 2021-01-16 VITALS — BP 135/85 | HR 99

## 2021-01-16 DIAGNOSIS — N898 Other specified noninflammatory disorders of vagina: Secondary | ICD-10-CM

## 2021-01-16 MED ORDER — FLUCONAZOLE 150 MG PO TABS: 150.0000 mg | ORAL_TABLET | Freq: Once | ORAL | 0 refills | Status: AC

## 2021-01-16 NOTE — Progress Notes (Signed)
Patient states she is having irriation and itching for approx 2-3 days. Patient states she does have a small amount of discharge. Patient requests GC/Chl testing to be done as well. Armandina Stammer RN

## 2021-01-17 LAB — CERVICOVAGINAL ANCILLARY ONLY
Bacterial Vaginitis (gardnerella): NEGATIVE
Candida Glabrata: NEGATIVE
Candida Vaginitis: POSITIVE — AB
Chlamydia: NEGATIVE
Comment: NEGATIVE
Comment: NEGATIVE
Comment: NEGATIVE
Comment: NEGATIVE
Comment: NORMAL
Neisseria Gonorrhea: NEGATIVE

## 2021-01-18 ENCOUNTER — Other Ambulatory Visit: Payer: Self-pay | Admitting: Family Medicine

## 2021-01-18 ENCOUNTER — Telehealth: Payer: Self-pay

## 2021-01-18 MED ORDER — FLUCONAZOLE 150 MG PO TABS: 150.0000 mg | ORAL_TABLET | Freq: Once | ORAL | 0 refills | Status: AC

## 2021-01-18 NOTE — Telephone Encounter (Signed)
Patient called and made aware of yeast infection. Patient states she did take one diflucan but she is still having some symptoms. Sent Rx for another diflucan for patient for yeast infection. Armandina Stammer RN

## 2021-01-23 ENCOUNTER — Ambulatory Visit: Payer: Medicaid Other | Admitting: Family Medicine

## 2021-01-30 ENCOUNTER — Ambulatory Visit: Payer: Medicaid Other | Admitting: Family Medicine

## 2021-02-15 ENCOUNTER — Ambulatory Visit
Admission: EM | Admit: 2021-02-15 | Discharge: 2021-02-15 | Disposition: A | Discharge status: Home / Self Care | Payer: Medicaid Other

## 2021-02-15 ENCOUNTER — Other Ambulatory Visit: Payer: Self-pay

## 2021-02-15 DIAGNOSIS — R42 Dizziness and giddiness: Secondary | ICD-10-CM

## 2021-02-15 DIAGNOSIS — H6982 Other specified disorders of Eustachian tube, left ear: Secondary | ICD-10-CM

## 2021-02-15 DIAGNOSIS — H9202 Otalgia, left ear: Secondary | ICD-10-CM

## 2021-02-15 DIAGNOSIS — R030 Elevated blood-pressure reading, without diagnosis of hypertension: Secondary | ICD-10-CM

## 2021-02-15 DIAGNOSIS — M542 Cervicalgia: Secondary | ICD-10-CM

## 2021-02-15 DIAGNOSIS — I10 Essential (primary) hypertension: Secondary | ICD-10-CM

## 2021-02-15 MED ORDER — FLUTICASONE PROPIONATE 50 MCG/ACT NA SUSP: 2.0000 | Freq: Every day | NASAL | 12 refills | Status: DC

## 2021-02-15 MED ORDER — NAPROXEN 375 MG PO TABS: 375.0000 mg | ORAL_TABLET | Freq: Two times a day (BID) | ORAL | 0 refills | Status: DC

## 2021-02-15 MED ORDER — TIZANIDINE HCL 4 MG PO TABS: 4.0000 mg | ORAL_TABLET | Freq: Every day | ORAL | 0 refills | Status: DC

## 2021-02-15 MED ORDER — PSEUDOEPHEDRINE HCL 60 MG PO TABS: 60.0000 mg | ORAL_TABLET | Freq: Three times a day (TID) | ORAL | 0 refills | Status: DC | PRN

## 2021-02-15 NOTE — ED Provider Notes (Signed)
[facility]   MRN: [id number] DOB: 03-Oct-2000  Subjective:   Alexis [name] is a 20 y.o. female presenting for 2-day history of acute onset left ear pain, intermittent dizziness, ear popping.  The pain can radiate down her neck.  However she has bilateral neck pain.  Feels like she might of been sleeping wrong.  Has also had a posterior headache.  Denies confusion, vision change, weakness, numbness or tingling, chest pain, heart racing, hematuria, abdominal pain.  Patient just had twins 3 months ago, is not breast-feeding.  She does take Zyrtec daily.  No cough, runny or stuffy nose, sore throat.  No current facility-administered medications for this encounter.  Current Outpatient Medications:    cetirizine (ZYRTEC) 10 MG tablet, Take 10 mg by mouth daily., Disp: , Rfl:    LO LOESTRIN FE 1 MG-10 MCG / 10 MCG tablet, Take 1 tablet by mouth daily., Disp: , Rfl:    pantoprazole (PROTONIX) 40 MG tablet, Take 40 mg by mouth daily., Disp: , Rfl:    Allergies  Allergen Reactions   Dust Mite Extract Hives   Mixed Ragweed Hives   Venofer [Iron Sucrose] Other (See Comments)    Chest pain    Past Medical History:  Diagnosis Date   Anxiety    COVID-19    Depression    GERD (gastroesophageal reflux disease)    Tachycardia    no medications-intermittent     Past Surgical History:  Procedure Laterality Date   NO PAST SURGERIES      Family History  Problem Relation Age of Onset   Diabetes Mellitus II Mother    Hypertension Mother    Hypertension Father    Stomach cancer Maternal Aunt    Stomach cancer Paternal Grandmother     Social History   Tobacco Use   Smoking status: Never   Smokeless tobacco: Never  Vaping Use   Vaping Use: Never used  Substance Use Topics   Alcohol use: Not Currently   Drug use: Not Currently    ROS   Objective:   Vitals: BP (!) 150/95 (BP Location: Left Arm)   Pulse 80   Temp 98.4 F (36.9 C) (Oral)   Resp 18    LMP 01/21/2021 (Exact Date)   SpO2 99%   Breastfeeding No   Physical Exam Constitutional:      General: She is not in acute distress.    Appearance: She is well-developed. She is not ill-appearing, toxic-appearing or diaphoretic.  HENT:     Head: Normocephalic and atraumatic.     Right Ear: Tympanic membrane and ear canal normal. No drainage or tenderness. No middle ear effusion. Tympanic membrane is not erythematous.     Left Ear: Tympanic membrane and ear canal normal. No drainage or tenderness.  No middle ear effusion. Tympanic membrane is not erythematous.     Nose: No congestion or rhinorrhea.     Mouth/Throat:     Mouth: Mucous membranes are moist. No oral lesions.     Pharynx: Oropharynx is clear. No pharyngeal swelling, oropharyngeal exudate, posterior oropharyngeal erythema or uvula swelling.     Tonsils: No tonsillar exudate or tonsillar abscesses.  Eyes:     General: No scleral icterus.    Extraocular Movements: Extraocular movements intact.     Right eye: Normal extraocular motion.     Left eye: Normal extraocular motion.     Conjunctiva/sclera: Conjunctivae normal.     Pupils: Pupils are equal, round, and reactive to light.  Neck:     Meningeal: Brudzinski's sign and Kernig's sign absent.  Cardiovascular:     Rate and Rhythm: Normal rate.  Pulmonary:     Effort: Pulmonary effort is normal.  Musculoskeletal:     Cervical back: Normal range of motion and neck supple. No rigidity.  Lymphadenopathy:     Cervical: No cervical adenopathy.  Skin:    General: Skin is warm and dry.  Neurological:     General: No focal deficit present.     Mental Status: She is alert and oriented to person, place, and time.     Cranial Nerves: No cranial nerve deficit or facial asymmetry.     Motor: No weakness.     Coordination: Romberg sign negative. Coordination normal.     Gait: Gait normal.     Deep Tendon Reflexes: Reflexes normal.  Psychiatric:        Mood and Affect: Mood  normal. Mood is not anxious or depressed.        Behavior: Behavior normal. Behavior is not agitated.     Assessment and Plan :   PDMP not reviewed this encounter.  1. Eustachian tube dysfunction, left   2. Left ear pain   3. Essential hypertension   4. Elevated blood pressure reading   5. Dizziness   6. Neck pain     Recommended conservative management for eustachian tube dysfunction with Flonase, pseudoephedrine in addition to her Zyrtec.  Use naproxen, Tylenol and tizanidine for tension type headache, musculoskeletal neck pain.  No suspicion for an acute encephalopathy, meningitis. Counseled patient on potential for adverse effects with medications prescribed/recommended today, ER and return-to-clinic precautions discussed, patient verbalized understanding.    Wallis Bamberg, [place] 02/15/21 [id number]

## 2021-02-15 NOTE — ED Triage Notes (Signed)
Four day h/o HA and two day h/o left ear pain and dizziness.  Has been taking Alka Seltzer and Ibuprofen without relief. Also takes Zyrtec QD. Pt denies throat pain, but notes when she swallows her left ear pain and the pain on the back left side of her head increases. 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 Confirms some nausea and fatigue.  Pt gave birth 62mo ago, notes that she was induced due to preeclampsia. Has not taken BP meds since first post-birth visit.

## 2021-02-22 ENCOUNTER — Other Ambulatory Visit: Payer: Self-pay

## 2021-02-22 ENCOUNTER — Other Ambulatory Visit (HOSPITAL_COMMUNITY)
Admission: RE | Admit: 2021-02-22 | Discharge: 2021-02-22 | Disposition: A | Discharge status: Home / Self Care | Payer: Medicaid Other | Source: Ambulatory Visit | Attending: Obstetrics & Gynecology | Admitting: Obstetrics & Gynecology

## 2021-02-22 ENCOUNTER — Ambulatory Visit (INDEPENDENT_AMBULATORY_CARE_PROVIDER_SITE_OTHER): Payer: Medicaid Other

## 2021-02-22 VITALS — BP 134/85 | HR 79 | Wt 169.0 lb

## 2021-02-22 DIAGNOSIS — R399 Unspecified symptoms and signs involving the genitourinary system: Secondary | ICD-10-CM | POA: Diagnosis not present

## 2021-02-22 DIAGNOSIS — N898 Other specified noninflammatory disorders of vagina: Secondary | ICD-10-CM | POA: Diagnosis not present

## 2021-02-22 LAB — POCT URINALYSIS DIPSTICK
Bilirubin, UA: NEGATIVE
Glucose, UA: NEGATIVE
Ketones, UA: NEGATIVE
Nitrite, UA: NEGATIVE
Protein, UA: NEGATIVE
Spec Grav, UA: 1.025 (ref 1.010–1.025)
Urobilinogen, UA: 0.2 E.U./dL
pH, UA: 6.5 (ref 5.0–8.0)

## 2021-02-22 NOTE — Progress Notes (Signed)
SUBJECTIVE:  20 y.o. female complains of white vaginal discharge for  5 day(s). Denies abnormal vaginal bleeding and fever. Denies history of known exposure to STD.  Patient's last menstrual period was 02/22/2021.  OBJECTIVE:  She appears well, afebrile. Urine dipstick: positive for WBC's. Lower Rt abdominal pain, burning with urination, and back pain x 3 days.  ASSESSMENT:  Vaginal Discharge     PLAN: Urine culture,  BVAG, CVAG probe sent to lab. Treatment: To be determined once lab results are received ROV prn if symptoms persist or worsen.

## 2021-02-25 LAB — CERVICOVAGINAL ANCILLARY ONLY
Bacterial Vaginitis (gardnerella): NEGATIVE
Candida Glabrata: NEGATIVE
Candida Vaginitis: NEGATIVE
Comment: NEGATIVE
Comment: NEGATIVE
Comment: NEGATIVE

## 2021-02-25 NOTE — Progress Notes (Signed)
Patient was assessed and managed by nursing staff during this encounter. I have reviewed the chart and agree with the documentation and plan.   Jaynie Collins, MD 02/25/2021 10:08 AM

## 2021-02-27 ENCOUNTER — Telehealth: Payer: Self-pay

## 2021-02-27 DIAGNOSIS — B379 Candidiasis, unspecified: Secondary | ICD-10-CM

## 2021-02-27 LAB — URINE CULTURE

## 2021-02-27 MED ORDER — FLUCONAZOLE 150 MG PO TABS: ORAL_TABLET | ORAL | 0 refills | Status: DC

## 2021-02-27 NOTE — Telephone Encounter (Signed)
Pt called stating she thinks she has a yeast infection.Diflucan 150 mg 1 tablet PO once was sent to her pharmacy. Karry Causer l Madelyne Millikan, CMA

## 2021-03-01 ENCOUNTER — Other Ambulatory Visit: Payer: Self-pay

## 2021-03-01 MED ORDER — FLUCONAZOLE 150 MG PO TABS: 150.0000 mg | ORAL_TABLET | Freq: Once | ORAL | 0 refills | Status: AC

## 2021-03-01 NOTE — Telephone Encounter (Signed)
Patient has yeast infection symptoms after being on antibiotic for UTI.diflucan sent per protocol. Armandina Stammer RN

## 2021-03-11 ENCOUNTER — Ambulatory Visit
Admission: EM | Admit: 2021-03-11 | Discharge: 2021-03-11 | Disposition: A | Discharge status: Home / Self Care | Payer: Medicaid Other | Attending: Urgent Care | Admitting: Urgent Care

## 2021-03-11 ENCOUNTER — Other Ambulatory Visit: Payer: Self-pay

## 2021-03-11 ENCOUNTER — Encounter: Payer: Self-pay | Admitting: Emergency Medicine

## 2021-03-11 DIAGNOSIS — R0981 Nasal congestion: Secondary | ICD-10-CM

## 2021-03-11 DIAGNOSIS — L03213 Periorbital cellulitis: Secondary | ICD-10-CM

## 2021-03-11 DIAGNOSIS — J069 Acute upper respiratory infection, unspecified: Secondary | ICD-10-CM | POA: Diagnosis not present

## 2021-03-11 MED ORDER — BENZONATATE 100 MG PO CAPS: 100.0000 mg | ORAL_CAPSULE | Freq: Three times a day (TID) | ORAL | 0 refills | Status: DC | PRN

## 2021-03-11 MED ORDER — CEFDINIR 300 MG PO CAPS: 300.0000 mg | ORAL_CAPSULE | Freq: Two times a day (BID) | ORAL | 0 refills | Status: DC

## 2021-03-11 MED ORDER — PSEUDOEPHEDRINE HCL 60 MG PO TABS: 60.0000 mg | ORAL_TABLET | Freq: Three times a day (TID) | ORAL | 0 refills | Status: DC | PRN

## 2021-03-11 MED ORDER — CETIRIZINE HCL 10 MG PO TABS: 10.0000 mg | ORAL_TABLET | Freq: Every day | ORAL | 0 refills | Status: DC

## 2021-03-11 MED ORDER — PROMETHAZINE-DM 6.25-15 MG/5ML PO SYRP: 5.0000 mL | ORAL_SOLUTION | Freq: Every evening | ORAL | 0 refills | Status: DC | PRN

## 2021-03-11 NOTE — ED Triage Notes (Signed)
Patient c/o right eyelid swelling and heaviness since yesterday.  Patient began with sneezing, sore throat, cough, body aches and chills.  Patient has taken Ibuprofen, Daytime Cold & Flu.  Patient is vaccinated for COVID.

## 2021-03-11 NOTE — ED Provider Notes (Signed)
[facility]   MRN: [id number] DOB: [birth year]  Subjective:   Alexis [name] is a 20 y.o. female presenting for 2-day history of acute onset right upper eyelid pain with swelling.  Feels a heaviness of the right upper eye.  Has also had sneezing, throat pain, cough, body aches and chills.  Has been using over-the-counter medications with minimal relief.  Patient is vaccinated for COVID-19.  States that she has had a history of this and does not care for testing as it feels very differently for her.  No chest pain, shortness of breath, wheezing, nausea, vomiting, abdominal pain.  No current facility-administered medications for this encounter.  Current Outpatient Medications:    cetirizine (ZYRTEC) 10 MG tablet, Take 10 mg by mouth daily., Disp: , Rfl:    LO LOESTRIN FE 1 MG-10 MCG / 10 MCG tablet, Take 1 tablet by mouth daily., Disp: , Rfl:    pantoprazole (PROTONIX) 40 MG tablet, Take 40 mg by mouth daily., Disp: , Rfl:    fluconazole (DIFLUCAN) 150 MG tablet, Take 1 tablet by mouth. Repeat in 3 days if symptoms persists., Disp: 1 tablet, Rfl: 0   fluticasone (FLONASE) 50 MCG/ACT nasal spray, Place 2 sprays into both nostrils daily. (Patient not taking: Reported on 02/22/2021), Disp: 16 g, Rfl: 12   naproxen (NAPROSYN) 375 MG tablet, Take 1 tablet (375 mg total) by mouth 2 (two) times daily with a meal. (Patient not taking: Reported on 02/22/2021), Disp: 30 tablet, Rfl: 0   pseudoephedrine (SUDAFED) 60 MG tablet, Take 1 tablet (60 mg total) by mouth every 8 (eight) hours as needed for congestion. (Patient not taking: Reported on 02/22/2021), Disp: 30 tablet, Rfl: 0   tiZANidine (ZANAFLEX) 4 MG tablet, Take 1 tablet (4 mg total) by mouth at bedtime., Disp: 30 tablet, Rfl: 0   Allergies  Allergen Reactions   Dust Mite Extract Hives   Mixed Ragweed Hives   Venofer [Iron Sucrose] Other (See Comments)    Chest pain    Past Medical History:  Diagnosis Date   Anxiety     COVID-19    Depression    GERD (gastroesophageal reflux disease)    Tachycardia    no medications-intermittent     Past Surgical History:  Procedure Laterality Date   NO PAST SURGERIES      Family History  Problem Relation Age of Onset   Diabetes Mellitus II Mother    Hypertension Mother    Hypertension Father    Stomach cancer Maternal Aunt    Stomach cancer Paternal Grandmother     Social History   Tobacco Use   Smoking status: Never   Smokeless tobacco: Never  Vaping Use   Vaping Use: Never used  Substance Use Topics   Alcohol use: Not Currently   Drug use: Not Currently    ROS   Objective:   Vitals: BP 132/87 (BP Location: Left Arm)   Pulse 90   Temp 99.2 F (37.3 C) (Oral)   Resp 16   Ht 5\' 5"  (1.651 m)   Wt 160 lb (72.6 kg)   LMP 02/22/2021   SpO2 98%   Breastfeeding No   BMI 26.63 kg/m   Physical Exam Constitutional:      General: She is not in acute distress.    Appearance: Normal appearance. She is well-developed. She is not ill-appearing, toxic-appearing or diaphoretic.  HENT:     Head: Normocephalic and atraumatic.     Right Ear: Tympanic membrane and ear  canal normal. No drainage or tenderness. No middle ear effusion. Tympanic membrane is not erythematous.     Left Ear: Tympanic membrane and ear canal normal. No drainage or tenderness.  No middle ear effusion. Tympanic membrane is not erythematous.     Nose: Nose normal. No congestion or rhinorrhea.     Mouth/Throat:     Mouth: Mucous membranes are moist. No oral lesions.     Pharynx: Oropharynx is clear. No pharyngeal swelling, oropharyngeal exudate, posterior oropharyngeal erythema or uvula swelling.     Tonsils: No tonsillar exudate or tonsillar abscesses.  Eyes:     General: Lids are everted, no foreign bodies appreciated.        Right eye: No foreign body, discharge or hordeolum.        Left eye: No foreign body, discharge or hordeolum.     Extraocular Movements: Extraocular  movements intact.     Right eye: Normal extraocular motion.     Left eye: Normal extraocular motion.     Conjunctiva/sclera: Conjunctivae normal.     Right eye: Right conjunctiva is not injected. No chemosis, exudate or hemorrhage.    Left eye: Left conjunctiva is not injected. No chemosis, exudate or hemorrhage.    Pupils: Pupils are equal, round, and reactive to light.   Cardiovascular:     Rate and Rhythm: Normal rate and regular rhythm.     Pulses: Normal pulses.     Heart sounds: Normal heart sounds. No murmur heard.   No friction rub. No gallop.  Pulmonary:     Effort: Pulmonary effort is normal. No respiratory distress.     Breath sounds: Normal breath sounds. No stridor. No wheezing, rhonchi or rales.  Musculoskeletal:     Cervical back: Normal range of motion and neck supple.  Lymphadenopathy:     Cervical: No cervical adenopathy.  Skin:    General: Skin is warm and dry.     Findings: No rash.  Neurological:     General: No focal deficit present.     Mental Status: She is alert and oriented to person, place, and time.  Psychiatric:        Mood and Affect: Mood normal.        Behavior: Behavior normal.        Thought Content: Thought content normal.      Assessment and Plan :   PDMP not reviewed this encounter.  1. Preseptal cellulitis of right upper eyelid   2. Viral URI with cough   3. Nasal congestion     Will cover for preseptal cellulitis of the right upper eyelid with cefdinir.  Recommended supportive care otherwise for what I suspect is a viral URI with cough.  Patient declined COVID-19 testing.  Clear cardiopulmonary exam, therefore deferred imaging. Counseled patient on potential for adverse effects with medications prescribed/recommended today, ER and return-to-clinic precautions discussed, patient verbalized understanding.    Wallis Bamberg, [place] 03/11/21 1843

## 2021-03-11 NOTE — Discharge Instructions (Signed)
We will notify you of your COVID-19 test results as they arrive and may take between 48-72 hours.  I encourage you to sign up for MyChart if you have not already done so as this can be the easiest way for [place] to communicate results to you online or through a phone app.  Generally, we only contact you if it is a positive COVID result.  In the meantime, if you develop worsening symptoms including fever, chest pain, shortness of breath despite our current treatment plan then please report to the emergency room as this may be a sign of worsening status from possible COVID-19 infection.  For your eye, start taking cefdinir to address preseptal cellulitis, an infection of the eyelid of your eye. Otherwise, we will manage this as a viral syndrome. For sore throat or cough try using a honey-based tea. Use 3 teaspoons of honey with juice squeezed from half lemon. Place shaved pieces of ginger into 1/2-1 cup of water and warm over stove top. Then mix the ingredients and repeat every 4 hours as needed. Please take Tylenol 500mg -650mg  every 6 hours for aches and pains, fevers. Hydrate very well with at least 2 liters of water. Eat light meals such as soups to replenish electrolytes and soft fruits, veggies. Start an antihistamine like Zyrtec, Allegra or Claritin for postnasal drainage, sinus congestion.  You can take this together with pseudoephedrine (Sudafed) at a dose of 60 mg 2-3 times a day as needed for the same kind of congestion.

## 2021-03-28 ENCOUNTER — Encounter: Payer: Self-pay | Admitting: Emergency Medicine

## 2021-03-28 ENCOUNTER — Other Ambulatory Visit: Payer: Self-pay

## 2021-03-28 ENCOUNTER — Ambulatory Visit
Admission: EM | Admit: 2021-03-28 | Discharge: 2021-03-28 | Disposition: A | Discharge status: Home / Self Care | Payer: Medicaid Other | Attending: Internal Medicine | Admitting: Internal Medicine

## 2021-03-28 DIAGNOSIS — M545 Low back pain, unspecified: Secondary | ICD-10-CM | POA: Diagnosis not present

## 2021-03-28 DIAGNOSIS — S134XXA Sprain of ligaments of cervical spine, initial encounter: Secondary | ICD-10-CM

## 2021-03-28 MED ORDER — IBUPROFEN 600 MG PO TABS: 600.0000 mg | ORAL_TABLET | Freq: Four times a day (QID) | ORAL | 0 refills | Status: DC | PRN

## 2021-03-28 NOTE — Discharge Instructions (Addendum)
You have been prescribed ibuprofen to decrease pain and inflammation.  Please also alternate ice and heat application to affected areas of pain.  Follow-up at urgent care if symptoms persist or go to the hospital symptoms worsen.
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
## 2021-04-10 ENCOUNTER — Other Ambulatory Visit (HOSPITAL_COMMUNITY)
Admission: RE | Admit: 2021-04-10 | Discharge: 2021-04-10 | Disposition: A | Discharge status: Home / Self Care | Payer: Medicaid Other | Source: Ambulatory Visit | Attending: Family Medicine | Admitting: Family Medicine

## 2021-04-10 ENCOUNTER — Ambulatory Visit: Payer: Medicaid Other

## 2021-04-10 ENCOUNTER — Other Ambulatory Visit: Payer: Self-pay

## 2021-04-10 VITALS — BP 129/85 | HR 79 | Wt 170.0 lb

## 2021-04-10 DIAGNOSIS — N898 Other specified noninflammatory disorders of vagina: Secondary | ICD-10-CM

## 2021-04-10 DIAGNOSIS — B379 Candidiasis, unspecified: Secondary | ICD-10-CM

## 2021-04-10 MED ORDER — FLUCONAZOLE 150 MG PO TABS: ORAL_TABLET | ORAL | 1 refills | Status: DC

## 2021-04-10 MED ORDER — FLUCONAZOLE 150 MG PO TABS: ORAL_TABLET | ORAL | 0 refills | Status: DC

## 2021-04-10 NOTE — Progress Notes (Signed)
SUBJECTIVE:  20 y.o. female complains of white vaginal discharge for 3  day(s). Denies abnormal vaginal bleeding or significant pelvic pain or fever. No UTI symptoms. Denies history of known exposure to STD.  Patient's last menstrual period was 03/18/2021.  OBJECTIVE:  She appears well, afebrile. Urine dipstick: not done.  ASSESSMENT:  Vaginal Discharge  Vaginal Odor   PLAN: BVAG, CVAG probe sent to lab. Treatment: To be determined once lab results are received ROV prn if symptoms persist or worsen.  Diflucan 150 mg was sent to her pharmacy.  Elman Dettman l Annalyce Lanpher, CMA

## 2021-04-11 LAB — CERVICOVAGINAL ANCILLARY ONLY
Bacterial Vaginitis (gardnerella): NEGATIVE
Candida Glabrata: NEGATIVE
Candida Vaginitis: NEGATIVE
Comment: NEGATIVE
Comment: NEGATIVE
Comment: NEGATIVE

## 2021-04-11 NOTE — Progress Notes (Signed)
Chart reviewed - agree with CMA/RN documentation.  ° °

## 2021-04-18 ENCOUNTER — Other Ambulatory Visit: Payer: Self-pay | Admitting: Family Medicine

## 2021-04-18 DIAGNOSIS — N898 Other specified noninflammatory disorders of vagina: Secondary | ICD-10-CM

## 2021-04-18 DIAGNOSIS — B379 Candidiasis, unspecified: Secondary | ICD-10-CM

## 2021-04-25 ENCOUNTER — Ambulatory Visit: Payer: Medicaid Other | Admitting: Family Medicine

## 2021-04-30 ENCOUNTER — Ambulatory Visit: Payer: Medicaid Other | Admitting: Advanced Practice Midwife

## 2021-05-18 ENCOUNTER — Other Ambulatory Visit: Payer: Self-pay | Admitting: Family Medicine

## 2021-05-18 DIAGNOSIS — B379 Candidiasis, unspecified: Secondary | ICD-10-CM

## 2021-05-18 DIAGNOSIS — N898 Other specified noninflammatory disorders of vagina: Secondary | ICD-10-CM

## 2021-05-28 ENCOUNTER — Other Ambulatory Visit (HOSPITAL_COMMUNITY)
Admission: RE | Admit: 2021-05-28 | Discharge: 2021-05-28 | Disposition: A | Discharge status: Home / Self Care | Payer: Medicaid Other | Source: Ambulatory Visit | Attending: Advanced Practice Midwife | Admitting: Advanced Practice Midwife

## 2021-05-28 ENCOUNTER — Ambulatory Visit (INDEPENDENT_AMBULATORY_CARE_PROVIDER_SITE_OTHER): Payer: Medicaid Other | Admitting: Advanced Practice Midwife

## 2021-05-28 ENCOUNTER — Encounter: Payer: Self-pay | Admitting: Advanced Practice Midwife

## 2021-05-28 ENCOUNTER — Other Ambulatory Visit: Payer: Self-pay

## 2021-05-28 VITALS — BP 132/89 | HR 87 | Wt 171.0 lb

## 2021-05-28 DIAGNOSIS — N898 Other specified noninflammatory disorders of vagina: Secondary | ICD-10-CM | POA: Insufficient documentation

## 2021-05-28 DIAGNOSIS — B379 Candidiasis, unspecified: Secondary | ICD-10-CM | POA: Insufficient documentation

## 2021-05-28 DIAGNOSIS — N3001 Acute cystitis with hematuria: Secondary | ICD-10-CM | POA: Insufficient documentation

## 2021-05-28 LAB — CERVICOVAGINAL ANCILLARY ONLY
Bacterial Vaginitis (gardnerella): NEGATIVE
Candida Glabrata: NEGATIVE
Candida Vaginitis: NEGATIVE
Comment: NEGATIVE
Comment: NEGATIVE
Comment: NEGATIVE

## 2021-05-28 MED ORDER — SULFAMETHOXAZOLE-TRIMETHOPRIM 800-160 MG PO TABS: 1.0000 | ORAL_TABLET | Freq: Two times a day (BID) | ORAL | 0 refills | Status: AC

## 2021-05-28 MED ORDER — FLUCONAZOLE 150 MG PO TABS: ORAL_TABLET | ORAL | 1 refills | Status: DC

## 2021-05-29 NOTE — Progress Notes (Signed)
° °  Subjective:    Patient ID: Alexis [name], female    DOB: 2000-07-10, 20 y.o.   MRN: [id number] This is a 20 y.o. female who present with c/o persistent vaginal discharge.  She requests testing for BV, Yeast and STDs   Also has some bladder pain and frequency.  No abnormal bleeding.  No fever or chills  Vaginal Discharge The patient's primary symptoms include genital lesions (1 "hair bump" on left labia), a genital odor and vaginal discharge. The patient's pertinent negatives include no genital itching, genital rash, missed menses or pelvic pain (but does have some bladder pain). This is a new problem. The current episode started 1 to 4 weeks ago. The problem has been unchanged. She is not pregnant. Associated symptoms include dysuria and frequency. Pertinent negatives include no abdominal pain, back pain, diarrhea, fever, hematuria, nausea or vomiting. The vaginal discharge was white and thick. There has been no bleeding. She has not been passing clots. She has not been passing tissue. Nothing aggravates the symptoms. She has tried nothing for the symptoms.     Review of Systems  Constitutional:  Negative for fever.  Gastrointestinal:  Negative for abdominal pain, diarrhea, nausea and vomiting.  Genitourinary:  Positive for dysuria, frequency and vaginal discharge. Negative for hematuria, missed menses and pelvic pain (but does have some bladder pain).  Musculoskeletal:  Negative for back pain.      Objective:   Physical Exam Constitutional:      General: She is not in acute distress.    Appearance: She is not ill-appearing or toxic-appearing.  HENT:     Head: Normocephalic.  Cardiovascular:     Rate and Rhythm: Normal rate.  Pulmonary:     Effort: Pulmonary effort is normal.  Abdominal:     Palpations: There is no mass.     Tenderness: There is no abdominal tenderness. There is no guarding or rebound.  Genitourinary:    Comments: Small folliculitis left labia, not  fluctuant Thin discharge  No lesions Swabs sent Musculoskeletal:        General: Normal range of motion.     Cervical back: Normal range of motion.  Skin:    General: Skin is warm and dry.  Neurological:     General: No focal deficit present.     Mental Status: She is alert.  Psychiatric:        Mood and Affect: Mood normal.          Assessment & Plan:  A;  Vaginal discharge       Dysuria and frequency  P:   Urine to culture        Rx Bactrim Ds x 3 days        Rx Diflucan prn yeast post antibiotic        Swabs sent         Follow results as indicated

## 2021-06-04 ENCOUNTER — Encounter: Payer: Self-pay | Admitting: Advanced Practice Midwife

## 2021-06-04 ENCOUNTER — Other Ambulatory Visit: Payer: Self-pay

## 2021-06-04 DIAGNOSIS — N898 Other specified noninflammatory disorders of vagina: Secondary | ICD-10-CM

## 2021-06-04 DIAGNOSIS — N390 Urinary tract infection, site not specified: Secondary | ICD-10-CM

## 2021-06-04 DIAGNOSIS — B379 Candidiasis, unspecified: Secondary | ICD-10-CM

## 2021-06-04 LAB — URINE CULTURE

## 2021-06-04 MED ORDER — NITROFURANTOIN MONOHYD MACRO 100 MG PO CAPS: 100.0000 mg | ORAL_CAPSULE | Freq: Two times a day (BID) | ORAL | 0 refills | Status: DC

## 2021-06-04 MED ORDER — FLUCONAZOLE 150 MG PO TABS: ORAL_TABLET | ORAL | 1 refills | Status: DC

## 2021-06-04 NOTE — Progress Notes (Signed)
Pt sent Mychart message stating she took Bactrim for a UTI but she is still experiencing burning when she urinates when she urinates. Macrobid 100 mg BID was sent to the pharmacy. Derisha Funderburke l [place], CMA

## 2021-06-27 ENCOUNTER — Other Ambulatory Visit: Payer: Self-pay

## 2021-06-27 DIAGNOSIS — B379 Candidiasis, unspecified: Secondary | ICD-10-CM

## 2021-06-27 DIAGNOSIS — N898 Other specified noninflammatory disorders of vagina: Secondary | ICD-10-CM

## 2021-06-27 MED ORDER — FLUCONAZOLE 150 MG PO TABS: ORAL_TABLET | ORAL | 1 refills | Status: DC

## 2021-06-27 NOTE — Progress Notes (Signed)
Patient called stating that she is having a white clumpy discharge for one day with some mild itching. Patient would like a refill of diflucan sent to [facility]. Sent per protocol. Kathrene Alu RN

## 2021-07-05 ENCOUNTER — Other Ambulatory Visit: Payer: Self-pay | Admitting: Family Medicine

## 2021-07-05 ENCOUNTER — Ambulatory Visit
Admission: EM | Admit: 2021-07-05 | Discharge: 2021-07-05 | Disposition: A | Discharge status: Home / Self Care | Payer: Medicaid Other | Attending: Physician Assistant | Admitting: Physician Assistant

## 2021-07-05 ENCOUNTER — Other Ambulatory Visit: Payer: Self-pay

## 2021-07-05 DIAGNOSIS — J029 Acute pharyngitis, unspecified: Secondary | ICD-10-CM

## 2021-07-05 DIAGNOSIS — N898 Other specified noninflammatory disorders of vagina: Secondary | ICD-10-CM

## 2021-07-05 DIAGNOSIS — B379 Candidiasis, unspecified: Secondary | ICD-10-CM

## 2021-07-05 DIAGNOSIS — J209 Acute bronchitis, unspecified: Secondary | ICD-10-CM

## 2021-07-05 MED ORDER — AMOXICILLIN-POT CLAVULANATE 875-125 MG PO TABS: 1.0000 | ORAL_TABLET | Freq: Two times a day (BID) | ORAL | 0 refills | Status: DC

## 2021-07-05 NOTE — ED Triage Notes (Signed)
2-3 week h/o cough and 3 day h/o sore throat with dysphagia. Has been taking ibuprofen w/o relief. No v/d.

## 2021-07-05 NOTE — ED Provider Notes (Signed)
[facility]    CSN: [id number] Arrival date & time: 07/05/21  1015      History   Chief Complaint Chief Complaint  Patient presents with   Sore Throat    HPI Alexis [name] is a 21 y.o. female.   Patient here today for evaluation of cough she has had the last 2-3 weeks. She reports that about 3 days ago she also developed some sore throat. She has been taking ibuprofen without significant relief. She has not had any nausea or vomiting or diarrhea.   The history is provided by the patient.  Sore Throat Pertinent negatives include no shortness of breath.   Past Medical History:  Diagnosis Date   Anxiety    COVID-19    Depression    GERD (gastroesophageal reflux disease)    Tachycardia    no medications-intermittent    Patient Active Problem List   Diagnosis Date Noted   COVID-19    Anxiety    Chronic hypertension 08/27/2020   GERD (gastroesophageal reflux disease)     Past Surgical History:  Procedure Laterality Date   NO PAST SURGERIES      OB History     Gravida  1   Para  1   Term  0   Preterm  1   AB  0   Living  2      SAB  0   IAB  0   Ectopic  0   Multiple  1   Live Births  2            Home Medications    Prior to Admission medications   Medication Sig Start Date End Date Taking? Authorizing Provider  amoxicillin-clavulanate (AUGMENTIN) 875-125 MG tablet Take 1 tablet by mouth every 12 (twelve) hours. 07/05/21  Yes Francene Finders, PA-C  fluconazole (DIFLUCAN) 150 MG tablet Take one tablet by mouth. Repeat in 3 days if symptoms persist. 06/27/21   Truett Mainland, DO  ibuprofen (ADVIL) 600 MG tablet Take 1 tablet (600 mg total) by mouth every 6 (six) hours as needed for mild pain. Patient not taking: Reported on 04/10/2021 03/28/21   Teodora Medici, FNP  LO LOESTRIN FE 1 MG-10 MCG / 10 MCG tablet Take 1 tablet by mouth daily. Patient not taking: Reported on 04/10/2021 02/07/21   [provider]  naproxen (NAPROSYN) 375 MG tablet Take 1 tablet (375 mg total) by mouth 2 (two) times daily with a meal. Patient not taking: Reported on 02/22/2021 02/15/21   Jaynee Eagles, PA-C  nitrofurantoin, macrocrystal-monohydrate, (MACROBID) 100 MG capsule Take 1 capsule (100 mg total) by mouth 2 (two) times daily. 06/04/21   Seabron Spates, CNM  pantoprazole (PROTONIX) 40 MG tablet Take 40 mg by mouth daily. Patient not taking: Reported on 04/10/2021    [provider]  Prenatal Vit-Fe Fumarate-FA (PRENATAL VITAMINS PO) Take by mouth. Patient not taking: Reported on 05/28/2021    [provider]  pseudoephedrine (SUDAFED) 60 MG tablet Take 1 tablet (60 mg total) by mouth every 8 (eight) hours as needed for congestion. Patient not taking: Reported on 04/10/2021 03/11/21   Jaynee Eagles, PA-C    Family History Family History  Problem Relation Age of Onset   Diabetes Mellitus II Mother    Hypertension Mother    Hypertension Father    Stomach cancer Maternal Aunt    Stomach cancer Paternal Grandmother     Social History Social History   Tobacco  Use   Smoking status: Never   Smokeless tobacco: Never  Vaping Use   Vaping Use: Never used  Substance Use Topics   Alcohol use: Not Currently   Drug use: Not Currently     Allergies   Dust mite extract, Mixed ragweed, and Venofer [iron sucrose]   Review of Systems Review of Systems  Constitutional:  Negative for chills and fever.  HENT:  Positive for sore throat. Negative for congestion.   Eyes:  Negative for discharge and redness.  Respiratory:  Positive for cough. Negative for shortness of breath.     Physical Exam Triage Vital Signs ED Triage Vitals  Enc Vitals Group     BP 07/05/21 1209 (!) 144/86     Pulse Rate 07/05/21 1209 87     Resp 07/05/21 1209 18     Temp 07/05/21 1209 98.7 F (37.1 C)     Temp Source 07/05/21 1209 Oral     SpO2 07/05/21 1209 100 %     Weight --      Height --      Head  Circumference --      Peak Flow --      Pain Score 07/05/21 1210 9     Pain Loc --      Pain Edu? --      Excl. in [place]? --    No data found.  Updated Vital Signs BP (!) 144/86 (BP Location: Right Arm)    Pulse 87    Temp 98.7 F (37.1 C) (Oral)    Resp 18    SpO2 100%    Breastfeeding No      Physical Exam Vitals and nursing note reviewed.  Constitutional:      General: She is not in acute distress.    Appearance: Normal appearance. She is not ill-appearing.  HENT:     Head: Normocephalic and atraumatic.     Nose: Congestion present.     Mouth/Throat:     Mouth: Mucous membranes are moist.     Pharynx: Oropharyngeal exudate and posterior oropharyngeal erythema present.  Eyes:     Conjunctiva/sclera: Conjunctivae normal.  Cardiovascular:     Rate and Rhythm: Normal rate and regular rhythm.     Heart sounds: Normal heart sounds. No murmur heard. Pulmonary:     Effort: Pulmonary effort is normal. No respiratory distress.     Breath sounds: Normal breath sounds. No wheezing, rhonchi or rales.  Skin:    General: Skin is warm and dry.  Neurological:     Mental Status: She is alert.  Psychiatric:        Mood and Affect: Mood normal.        Thought Content: Thought content normal.     UC Treatments / Results  Labs (all labs ordered are listed, but only abnormal results are displayed) Labs Reviewed - No data to display  EKG   Radiology No results found.  Procedures Procedures (including critical care time)  Medications Ordered in UC Medications - No data to display  Initial Impression / Assessment and Plan / UC Course  I have reviewed the triage vital signs and the nursing notes.  Pertinent labs & imaging results that were available during my care of the patient were reviewed by me and considered in my medical decision making (see chart for details).   Will treat with augmentin to cover both pna as well as strep given appearance of tonsils. Recommended follow up  if symptoms fail to  improve or worsen.   Final Clinical Impressions(s) / UC Diagnoses   Final diagnoses:  Acute pharyngitis, unspecified etiology  Acute bronchitis, unspecified organism   Discharge Instructions   None    ED Prescriptions     Medication Sig Dispense Auth. Provider   amoxicillin-clavulanate (AUGMENTIN) 875-125 MG tablet Take 1 tablet by mouth every 12 (twelve) hours. 14 tablet Francene Finders, PA-C      PDMP not reviewed this encounter.   Francene Finders, PA-C 07/05/21 1423

## 2021-07-10 ENCOUNTER — Other Ambulatory Visit: Payer: Self-pay

## 2021-07-10 DIAGNOSIS — B379 Candidiasis, unspecified: Secondary | ICD-10-CM

## 2021-07-10 DIAGNOSIS — N898 Other specified noninflammatory disorders of vagina: Secondary | ICD-10-CM

## 2021-07-10 MED ORDER — FLUCONAZOLE 150 MG PO TABS: ORAL_TABLET | ORAL | 1 refills | Status: DC

## 2021-07-10 NOTE — Progress Notes (Signed)
Patient recently had strep throat and was on antibiotics. Patient having vaginal itching now for the last few days. Patient given diflucan and made aware to called back if her symptoms are not improved. Armandina Stammer RN

## 2021-07-16 ENCOUNTER — Other Ambulatory Visit: Payer: Self-pay | Admitting: Family Medicine

## 2021-07-16 DIAGNOSIS — B379 Candidiasis, unspecified: Secondary | ICD-10-CM

## 2021-07-16 DIAGNOSIS — N898 Other specified noninflammatory disorders of vagina: Secondary | ICD-10-CM

## 2021-07-23 ENCOUNTER — Other Ambulatory Visit: Payer: Self-pay

## 2021-07-23 ENCOUNTER — Ambulatory Visit
Admission: EM | Admit: 2021-07-23 | Discharge: 2021-07-23 | Disposition: A | Discharge status: Home / Self Care | Payer: Medicaid Other | Attending: Internal Medicine | Admitting: Internal Medicine

## 2021-07-23 ENCOUNTER — Encounter: Payer: Self-pay | Admitting: Emergency Medicine

## 2021-07-23 DIAGNOSIS — J029 Acute pharyngitis, unspecified: Secondary | ICD-10-CM | POA: Diagnosis present

## 2021-07-23 DIAGNOSIS — J069 Acute upper respiratory infection, unspecified: Secondary | ICD-10-CM | POA: Diagnosis not present

## 2021-07-23 DIAGNOSIS — R051 Acute cough: Secondary | ICD-10-CM | POA: Diagnosis present

## 2021-07-23 DIAGNOSIS — K120 Recurrent oral aphthae: Secondary | ICD-10-CM | POA: Diagnosis not present

## 2021-07-23 LAB — POCT MONO SCREEN (KUC): Mono, POC: NEGATIVE

## 2021-07-23 LAB — POCT RAPID STREP A (OFFICE): Rapid Strep A Screen: NEGATIVE

## 2021-07-23 MED ORDER — LIDOCAINE VISCOUS HCL 2 % MT SOLN: 15.0000 mL | OROMUCOSAL | 0 refills | Status: DC | PRN

## 2021-07-23 MED ORDER — BENZONATATE 100 MG PO CAPS
100.0000 mg | ORAL_CAPSULE | Freq: Three times a day (TID) | ORAL | 0 refills | Status: DC | PRN
Start: 2021-07-23 — End: 2022-01-02

## 2021-07-23 MED ORDER — CEFDINIR 300 MG PO CAPS: 300.0000 mg | ORAL_CAPSULE | Freq: Two times a day (BID) | ORAL | 0 refills | Status: AC

## 2021-07-23 NOTE — Discharge Instructions (Signed)
You have been prescribed an antibiotic, a cough medication, and a lidocaine solution to help alleviate sore in mouth.  A chest x-ray has been ordered for you.  Please go to [facility] imaging at provided address to have this completed.  We will call with results.

## 2021-07-23 NOTE — ED Triage Notes (Signed)
Pt here for cough, nasal congestion and some sores in her mouth x 10 days

## 2021-07-23 NOTE — ED Provider Notes (Signed)
[facility]    CSN: [id number] Arrival date & time: 07/23/21  0851      History   Chief Complaint Chief Complaint  Patient presents with   Cough    HPI Maria Bernath is a 21 y.o. female.   Patient presents with nasal congestion, cough, sores in mouth, sore throat that has been present for approximately 10 days.  Denies any known sick contacts.  Tmax at home was 100.  Last known temp was a few days prior.  Patient is taking over-the-counter Tylenol and ibuprofen with minimal improvement.  Denies chest pain, shortness of breath, ear pain, nausea, vomiting, diarrhea, abdominal pain.   Cough  Past Medical History:  Diagnosis Date   Anxiety    COVID-19    Depression    GERD (gastroesophageal reflux disease)    Tachycardia    no medications-intermittent    Patient Active Problem List   Diagnosis Date Noted   COVID-19    Anxiety    Chronic hypertension 08/27/2020   GERD (gastroesophageal reflux disease)     Past Surgical History:  Procedure Laterality Date   NO PAST SURGERIES      OB History     Gravida  1   Para  1   Term  0   Preterm  1   AB  0   Living  2      SAB  0   IAB  0   Ectopic  0   Multiple  1   Live Births  2            Home Medications    Prior to Admission medications   Medication Sig Start Date End Date Taking? Authorizing Provider  benzonatate (TESSALON) 100 MG capsule Take 1 capsule (100 mg total) by mouth every 8 (eight) hours as needed for cough. 07/23/21  Yes Zohar Laing, Hildred Alamin E, FNP  cefdinir (OMNICEF) 300 MG capsule Take 1 capsule (300 mg total) by mouth 2 (two) times daily for 10 days. 07/23/21 08/02/21 Yes Bruin Bolger, Michele Rockers, FNP  lidocaine (XYLOCAINE) 2 % solution Use as directed 15 mLs in the mouth or throat as needed for mouth pain. Swish and spit.  May also apply directly to sore. 07/23/21  Yes Cid Agena, Hildred Alamin E, FNP  fluconazole (DIFLUCAN) 150 MG tablet TAKE ONE TABLET BY MOUTH. REPEAT IN 3 DAYS IF  SYMPTOMS PERSIST. Patient not taking: Reported on 07/23/2021 07/11/21   Truett Mainland, DO  fluconazole (DIFLUCAN) 150 MG tablet TAKE ONE TABLET BY MOUTH. REPEAT IN 3 DAYS IF SYMPTOMS PERSIST. Patient not taking: Reported on 07/23/2021 07/22/21   Truett Mainland, DO  ibuprofen (ADVIL) 600 MG tablet Take 1 tablet (600 mg total) by mouth every 6 (six) hours as needed for mild pain. Patient not taking: Reported on 04/10/2021 03/28/21   Teodora Medici, FNP  LO LOESTRIN FE 1 MG-10 MCG / 10 MCG tablet Take 1 tablet by mouth daily. Patient not taking: Reported on 04/10/2021 02/07/21   [provider]  naproxen (NAPROSYN) 375 MG tablet Take 1 tablet (375 mg total) by mouth 2 (two) times daily with a meal. Patient not taking: Reported on 02/22/2021 02/15/21   Jaynee Eagles, PA-C  nitrofurantoin, macrocrystal-monohydrate, (MACROBID) 100 MG capsule Take 1 capsule (100 mg total) by mouth 2 (two) times daily. Patient not taking: Reported on 07/23/2021 06/04/21   Seabron Spates, CNM  pantoprazole (PROTONIX) 40 MG tablet Take 40 mg by mouth daily. Patient not taking: Reported  on 04/10/2021    [provider]  Prenatal Vit-Fe Fumarate-FA (PRENATAL VITAMINS PO) Take by mouth. Patient not taking: Reported on 05/28/2021    [provider]  pseudoephedrine (SUDAFED) 60 MG tablet Take 1 tablet (60 mg total) by mouth every 8 (eight) hours as needed for congestion. Patient not taking: Reported on 04/10/2021 03/11/21   Jaynee Eagles, PA-C    Family History Family History  Problem Relation Age of Onset   Diabetes Mellitus II Mother    Hypertension Mother    Hypertension Father    Stomach cancer Maternal Aunt    Stomach cancer Paternal Grandmother     Social History Social History   Tobacco Use   Smoking status: Never   Smokeless tobacco: Never  Vaping Use   Vaping Use: Never used  Substance Use Topics   Alcohol use: Not Currently   Drug use: Not Currently     Allergies   Dust mite  extract, Mixed ragweed, and Venofer [iron sucrose]   Review of Systems Review of Systems Per HPI  Physical Exam Triage Vital Signs ED Triage Vitals  Enc Vitals Group     BP 07/23/21 0923 (!) 134/91     Pulse Rate 07/23/21 0923 91     Resp 07/23/21 0923 18     Temp 07/23/21 0923 98.1 F (36.7 C)     Temp Source 07/23/21 0923 Oral     SpO2 07/23/21 0923 100 %     Weight --      Height --      Head Circumference --      Peak Flow --      Pain Score 07/23/21 0924 6     Pain Loc --      Pain Edu? --      Excl. in [place]? --    No data found.  Updated Vital Signs BP (!) 134/91 (BP Location: Left Arm)    Pulse 91    Temp 98.1 F (36.7 C) (Oral)    Resp 18    SpO2 100%   Visual Acuity Right Eye Distance:   Left Eye Distance:   Bilateral Distance:    Right Eye Near:   Left Eye Near:    Bilateral Near:     Physical Exam Constitutional:      General: She is not in acute distress.    Appearance: Normal appearance. She is not toxic-appearing or diaphoretic.  HENT:     Head: Normocephalic and atraumatic.     Comments: Canker sore present to right upper inner lip.    Right Ear: Tympanic membrane and ear canal normal.     Left Ear: Tympanic membrane and ear canal normal.     Nose: Congestion present.     Mouth/Throat:     Mouth: Mucous membranes are moist.     Pharynx: Posterior oropharyngeal erythema present.  Eyes:     Extraocular Movements: Extraocular movements intact.     Conjunctiva/sclera: Conjunctivae normal.     Pupils: Pupils are equal, round, and reactive to light.  Cardiovascular:     Rate and Rhythm: Normal rate and regular rhythm.     Pulses: Normal pulses.     Heart sounds: Normal heart sounds.  Pulmonary:     Effort: Pulmonary effort is normal. No respiratory distress.     Breath sounds: Normal breath sounds. No stridor. No wheezing, rhonchi or rales.  Abdominal:     General: Abdomen is flat. Bowel sounds are normal.  Palpations: Abdomen is soft.   Musculoskeletal:        General: Normal range of motion.     Cervical back: Normal range of motion.  Skin:    General: Skin is warm and dry.  Neurological:     General: No focal deficit present.     Mental Status: She is alert and oriented to person, place, and time. Mental status is at baseline.  Psychiatric:        Mood and Affect: Mood normal.        Behavior: Behavior normal.     UC Treatments / Results  Labs (all labs ordered are listed, but only abnormal results are displayed) Labs Reviewed  CULTURE, GROUP A STREP [facility])  POCT RAPID STREP A (OFFICE)  POCT MONO SCREEN ([facility])    EKG   Radiology No results found.  Procedures Procedures (including critical care time)  Medications Ordered in UC Medications - No data to display  Initial Impression / Assessment and Plan / UC Course  I have reviewed the triage vital signs and the nursing notes.  Pertinent labs & imaging results that were available during my care of the patient were reviewed by me and considered in my medical decision making (see chart for details).     Will prescribe cefdinir antibiotic due to acute upper respiratory symptoms and duration of symptoms.  Patient recently was treated with Augmentin antibiotic in January so I am choosing to treat with cefdinir.  Benzonatate prescribed take as needed for cough as well.  Chest x-ray ordered given duration of cough and symptoms.  No x-ray tech in urgent care today.  Outpatient imaging ordered at [place].  Awaiting result.  Viscous lidocaine prescribed to help alleviate discomfort associated with canker sores in mouth.  Rapid strep was negative.  Throat culture is pending.  Patient requested monotest which was negative.  Do not think that additional viral testing is necessary given duration of symptoms.  Discussed return precautions.  Patient verbalized understanding and was agreeable with plan. Final Clinical Impressions(s) / UC Diagnoses   Final  diagnoses:  Acute upper respiratory infection  Acute cough  Canker sores oral  Sore throat     Discharge Instructions      You have been prescribed an antibiotic, a cough medication, and a lidocaine solution to help alleviate sore in mouth.  A chest x-ray has been ordered for you.  Please go to [facility] imaging at provided address to have this completed.  We will call with results.     ED Prescriptions     Medication Sig Dispense Auth. Provider   cefdinir (OMNICEF) 300 MG capsule Take 1 capsule (300 mg total) by mouth 2 (two) times daily for 10 days. 20 capsule [place], [place] E, [place]   benzonatate (TESSALON) 100 MG capsule Take 1 capsule (100 mg total) by mouth every 8 (eight) hours as needed for cough. 21 capsule [place], [place] E, [place]   lidocaine (XYLOCAINE) 2 % solution Use as directed 15 mLs in the mouth or throat as needed for mouth pain. Swish and spit.  May also apply directly to sore. 100 mL Teodora Medici, [place]      PDMP not reviewed this encounter.   Teodora Medici, [place] 07/23/21 1037

## 2021-07-24 ENCOUNTER — Ambulatory Visit
Admission: RE | Admit: 2021-07-24 | Discharge: 2021-07-24 | Disposition: A | Discharge status: Home / Self Care | Payer: Medicaid Other | Source: Ambulatory Visit | Attending: Internal Medicine | Admitting: Internal Medicine

## 2021-07-24 ENCOUNTER — Other Ambulatory Visit (HOSPITAL_COMMUNITY)
Admission: RE | Admit: 2021-07-24 | Discharge: 2021-07-24 | Disposition: A | Discharge status: Home / Self Care | Payer: Medicaid Other | Source: Ambulatory Visit | Attending: Family Medicine | Admitting: Family Medicine

## 2021-07-24 ENCOUNTER — Ambulatory Visit (INDEPENDENT_AMBULATORY_CARE_PROVIDER_SITE_OTHER): Payer: Medicaid Other | Admitting: *Deleted

## 2021-07-24 VITALS — BP 133/88 | HR 86 | Ht 65.0 in | Wt 167.0 lb

## 2021-07-24 DIAGNOSIS — Z113 Encounter for screening for infections with a predominantly sexual mode of transmission: Secondary | ICD-10-CM | POA: Diagnosis present

## 2021-07-24 NOTE — Progress Notes (Signed)
Pt here for STD testing.  She states that she like to have it done every 6 months.  She denies any new sexual partners and is not symptomatic.

## 2021-07-24 NOTE — Progress Notes (Signed)
Chart reviewed - agree with CMA/RN documentation.  ° °

## 2021-07-25 LAB — CERVICOVAGINAL ANCILLARY ONLY
Bacterial Vaginitis (gardnerella): NEGATIVE
Candida Glabrata: NEGATIVE
Candida Vaginitis: NEGATIVE
Chlamydia: NEGATIVE
Comment: NEGATIVE
Comment: NEGATIVE
Comment: NEGATIVE
Comment: NEGATIVE
Comment: NEGATIVE
Comment: NORMAL
Neisseria Gonorrhea: NEGATIVE
Trichomonas: NEGATIVE

## 2021-07-25 LAB — HEPATITIS C ANTIBODY: Hep C Virus Ab: <0.1 s/co ratio (ref 0.0–0.9)

## 2021-07-25 LAB — HIV ANTIBODY (ROUTINE TESTING W REFLEX): HIV Screen 4th Generation wRfx: NONREACTIVE

## 2021-07-25 LAB — HEPATITIS B SURFACE ANTIGEN: Hepatitis B Surface Ag: NEGATIVE

## 2021-07-25 LAB — RPR: RPR Ser Ql: NONREACTIVE

## 2021-07-26 LAB — CULTURE, GROUP A STREP (THRC)

## 2021-07-26 NOTE — Progress Notes (Signed)
Normal. No change to plan at this time.

## 2021-08-08 ENCOUNTER — Other Ambulatory Visit: Payer: Self-pay | Admitting: Family Medicine

## 2021-08-08 DIAGNOSIS — B379 Candidiasis, unspecified: Secondary | ICD-10-CM

## 2021-08-08 DIAGNOSIS — N898 Other specified noninflammatory disorders of vagina: Secondary | ICD-10-CM

## 2021-08-14 ENCOUNTER — Ambulatory Visit
Admission: EM | Admit: 2021-08-14 | Discharge: 2021-08-14 | Disposition: A | Discharge status: Home / Self Care | Payer: Medicaid Other

## 2021-08-14 ENCOUNTER — Other Ambulatory Visit: Payer: Self-pay

## 2021-08-14 DIAGNOSIS — H109 Unspecified conjunctivitis: Secondary | ICD-10-CM

## 2021-08-14 MED ORDER — POLYMYXIN B-TRIMETHOPRIM 10000-0.1 UNIT/ML-% OP SOLN: 1.0000 [drp] | Freq: Four times a day (QID) | OPHTHALMIC | 0 refills | Status: AC

## 2021-08-14 MED ORDER — ERYTHROMYCIN 5 MG/GM OP OINT: TOPICAL_OINTMENT | OPHTHALMIC | 0 refills | Status: DC

## 2021-08-14 NOTE — ED Triage Notes (Signed)
Pt presents with rt eye drainage and swelling that began this morning.  ?

## 2021-08-14 NOTE — Discharge Instructions (Signed)
You have pinkeye which is being treated with antibiotic drops.  Please follow-up with eye doctor if symptoms persist or worsen. ?

## 2021-08-14 NOTE — ED Provider Notes (Signed)
?[facility] ? ? ? ?CSN: [id number] ?Arrival date & time: 08/14/21  0820 ? ? ?  ? ?History   ?Chief Complaint ?Chief Complaint  ?Patient presents with  ? Conjunctivitis  ? ? ?HPI ?Alexis [name] is a 21 y.o. female.  ? ?Patient presents with right eye swelling and irritation that began this morning upon awakening.  Patient reports that she woke up with her eye crusted over and with some purulent drainage.  Denies any blurry vision.  Denies foreign body or trauma to the eye.  Patient does not wear contacts.  Patient reports that her family member had pinkeye recently.  Has some associated nasal congestion. ? ? ?Conjunctivitis ? ? ?Past Medical History:  ?Diagnosis Date  ? Anxiety   ? COVID-19   ? Depression   ? GERD (gastroesophageal reflux disease)   ? Tachycardia   ? no medications-intermittent  ? ? ?Patient Active Problem List  ? Diagnosis Date Noted  ? COVID-19   ? Anxiety   ? Chronic hypertension 08/27/2020  ? GERD (gastroesophageal reflux disease)   ? ? ?Past Surgical History:  ?Procedure Laterality Date  ? NO PAST SURGERIES    ? ? ?OB History   ? ? Gravida  ?1  ? Para  ?1  ? Term  ?0  ? Preterm  ?1  ? AB  ?0  ? Living  ?2  ?  ? ? SAB  ?0  ? IAB  ?0  ? Ectopic  ?0  ? Multiple  ?1  ? Live Births  ?2  ?   ?  ?  ? ? ? ?Home Medications   ? ?Prior to Admission medications   ?Medication Sig Start Date End Date Taking? Authorizing Provider  ?cetirizine (ZYRTEC) 10 MG tablet Take 10 mg by mouth daily.   Yes [provider]  ?trimethoprim-polymyxin b (POLYTRIM) ophthalmic solution Place 1 drop into the right eye every 6 (six) hours for 7 days. 08/14/21 08/21/21 Yes Tracie Dore, Acie Fredrickson, FNP  ?benzonatate (TESSALON) 100 MG capsule Take 1 capsule (100 mg total) by mouth every 8 (eight) hours as needed for cough. ?Patient not taking: Reported on 07/24/2021 07/23/21   Gustavus Bryant, FNP  ?fluconazole (DIFLUCAN) 150 MG tablet TAKE ONE TABLET BY MOUTH. REPEAT IN 3 DAYS IF SYMPTOMS PERSIST. ?Patient not  taking: Reported on 07/23/2021 07/11/21   [name], DO  ?fluconazole (DIFLUCAN) 150 MG tablet TAKE ONE TABLET BY MOUTH. REPEAT IN 3 DAYS IF SYMPTOMS PERSIST. ?Patient not taking: Reported on 08/14/2021 08/12/21   [name], DO  ?ibuprofen (ADVIL) 600 MG tablet Take 1 tablet (600 mg total) by mouth every 6 (six) hours as needed for mild pain. ?Patient not taking: Reported on 04/10/2021 03/28/21   Gustavus Bryant, FNP  ?lidocaine (XYLOCAINE) 2 % solution Use as directed 15 mLs in the mouth or throat as needed for mouth pain. Swish and spit.  May also apply directly to sore. ?Patient not taking: Reported on 07/24/2021 07/23/21   Gustavus Bryant, FNP  ?LO LOESTRIN FE 1 MG-10 MCG / 10 MCG tablet Take 1 tablet by mouth daily. ?Patient not taking: Reported on 04/10/2021 02/07/21   [provider]  ?naproxen (NAPROSYN) 375 MG tablet Take 1 tablet (375 mg total) by mouth 2 (two) times daily with a meal. ?Patient not taking: Reported on 02/22/2021 02/15/21   Wallis Bamberg, PA-C  ?nitrofurantoin, macrocrystal-monohydrate, (MACROBID) 100 MG capsule Take 1 capsule (100 mg total) by mouth 2 (  two) times daily. ?Patient not taking: Reported on 07/23/2021 06/04/21   Aviva Signs, CNM  ?pantoprazole (PROTONIX) 40 MG tablet Take 40 mg by mouth daily. ?Patient not taking: Reported on 04/10/2021    [provider]  ?Prenatal Vit-Fe Fumarate-FA (PRENATAL VITAMINS PO) Take by mouth. ?Patient not taking: Reported on 05/28/2021    [provider]  ?pseudoephedrine (SUDAFED) 60 MG tablet Take 1 tablet (60 mg total) by mouth every 8 (eight) hours as needed for congestion. ?Patient not taking: Reported on 04/10/2021 03/11/21   Wallis Bamberg, PA-C  ? ? ?Family History ?Family History  ?Problem Relation Age of Onset  ? Diabetes Mellitus II Mother   ? Hypertension Mother   ? Hypertension Father   ? Stomach cancer Maternal Aunt   ? Stomach cancer Paternal Grandmother   ? ? ?Social History ?Social History  ? ?Tobacco Use  ?  Smoking status: Never  ? Smokeless tobacco: Never  ?Vaping Use  ? Vaping Use: Never used  ?Substance Use Topics  ? Alcohol use: Not Currently  ? Drug use: Not Currently  ? ? ? ?Allergies   ?Dust mite extract, Mixed ragweed, and Venofer [iron sucrose] ? ? ?Review of Systems ?Review of Systems ?Per HPI ? ?Physical Exam ?Triage Vital Signs ?ED Triage Vitals  ?Enc Vitals Group  ?   BP 08/14/21 0837 135/82  ?   Pulse Rate 08/14/21 0837 74  ?   Resp 08/14/21 0837 14  ?   Temp 08/14/21 0837 98.3 ?F (36.8 ?C)  ?   Temp Source 08/14/21 0837 Oral  ?   SpO2 08/14/21 0837 100 %  ?   Weight --   ?   Height --   ?   Head Circumference --   ?   Peak Flow --   ?   Pain Score 08/14/21 0839 1  ?   Pain Loc --   ?   Pain Edu? --   ?   Excl. in [place]? --   ? ?No data found. ? ?Updated Vital Signs ?BP 135/82 (BP Location: Left Arm)   Pulse 74   Temp 98.3 ?F (36.8 ?C) (Oral)   Resp 14   SpO2 100%  ? ?Visual Acuity ?Right Eye Distance:   ?Left Eye Distance:   ?Bilateral Distance:   ? ?Right Eye Near:   ?Left Eye Near:    ?Bilateral Near:    ? ?Physical Exam ?Constitutional:   ?   General: She is not in acute distress. ?   Appearance: Normal appearance. She is not toxic-appearing or diaphoretic.  ?HENT:  ?   Head: Normocephalic and atraumatic.  ?Eyes:  ?   General: Lids are normal. Lids are everted, no foreign bodies appreciated. Vision grossly intact. Gaze aligned appropriately.  ?   Extraocular Movements: Extraocular movements intact.  ?   Conjunctiva/sclera:  ?   Right eye: Right conjunctiva is injected. No chemosis, exudate or hemorrhage. ?   Left eye: Left conjunctiva is not injected. No chemosis, exudate or hemorrhage. ?   Pupils: Pupils are equal, round, and reactive to light.  ?Pulmonary:  ?   Effort: Pulmonary effort is normal.  ?Neurological:  ?   General: No focal deficit present.  ?   Mental Status: She is alert and oriented to person, place, and time. Mental status is at baseline.  ?Psychiatric:     ?   Mood and Affect: Mood  normal.     ?   Behavior: Behavior normal.     ?  Thought Content: Thought content normal.     ?   Judgment: Judgment normal.  ? ? ? ?UC Treatments / Results  ?Labs ?(all labs ordered are listed, but only abnormal results are displayed) ?Labs Reviewed - No data to display ? ?EKG ? ? ?Radiology ?No results found. ? ?Procedures ?Procedures (including critical care time) ? ?Medications Ordered in UC ?Medications - No data to display ? ?Initial Impression / Assessment and Plan / UC Course  ?I have reviewed the triage vital signs and the nursing notes. ? ?Pertinent labs & imaging results that were available during my care of the patient were reviewed by me and considered in my medical decision making (see chart for details). ? ?  ? ?It appears the patient has bacterial conjunctivitis of the right eye.  Will treat with Polytrim antibiotic drops.  Visual acuity appears normal.  Visual acuity test was ordered prior to discharge but it appears that patient left before it was completed by nursing staff.  Patient to follow-up with eye doctor if symptoms persist or worsen.  Discussed return precautions.  Patient verbalized understanding and was agreeable with plan. ?Final Clinical Impressions(s) / UC Diagnoses  ? ?Final diagnoses:  ?Bacterial conjunctivitis of right eye  ? ? ? ?Discharge Instructions   ? ?  ?You have pinkeye which is being treated with antibiotic drops.  Please follow-up with eye doctor if symptoms persist or worsen. ? ? ? ?ED Prescriptions   ? ? Medication Sig Dispense Auth. Provider  ? erythromycin ophthalmic ointment  (Status: Discontinued) Place a 1/2 inch ribbon of ointment into the lower eyelid 4 times daily for 7 days. 3.5 g Ervin Knack E, [place]  ? trimethoprim-polymyxin b (POLYTRIM) ophthalmic solution Place 1 drop into the right eye every 6 (six) hours for 7 days. 10 mL Gustavus Bryant, [place]  ? ?  ? ?PDMP not reviewed this encounter. ?  ?Gustavus Bryant, [place] ?08/14/21 8891 ? ?

## 2021-08-30 ENCOUNTER — Other Ambulatory Visit: Payer: Self-pay | Admitting: Family Medicine

## 2021-08-30 DIAGNOSIS — N898 Other specified noninflammatory disorders of vagina: Secondary | ICD-10-CM

## 2021-08-30 DIAGNOSIS — B379 Candidiasis, unspecified: Secondary | ICD-10-CM

## 2021-09-28 ENCOUNTER — Other Ambulatory Visit: Payer: Self-pay | Admitting: Family Medicine

## 2021-09-28 DIAGNOSIS — N898 Other specified noninflammatory disorders of vagina: Secondary | ICD-10-CM

## 2021-09-28 DIAGNOSIS — B379 Candidiasis, unspecified: Secondary | ICD-10-CM

## 2021-11-01 ENCOUNTER — Other Ambulatory Visit: Payer: Self-pay | Admitting: Family Medicine

## 2021-11-01 DIAGNOSIS — N898 Other specified noninflammatory disorders of vagina: Secondary | ICD-10-CM

## 2021-11-01 DIAGNOSIS — B379 Candidiasis, unspecified: Secondary | ICD-10-CM

## 2021-11-07 ENCOUNTER — Ambulatory Visit (INDEPENDENT_AMBULATORY_CARE_PROVIDER_SITE_OTHER): Payer: Medicaid Other

## 2021-11-07 ENCOUNTER — Other Ambulatory Visit (HOSPITAL_COMMUNITY)
Admission: RE | Admit: 2021-11-07 | Discharge: 2021-11-07 | Disposition: A | Discharge status: Home / Self Care | Payer: Medicaid Other | Source: Ambulatory Visit | Attending: Family Medicine | Admitting: Family Medicine

## 2021-11-07 ENCOUNTER — Ambulatory Visit: Payer: Medicaid Other

## 2021-11-07 VITALS — BP 119/76 | HR 93 | Wt 161.0 lb

## 2021-11-07 DIAGNOSIS — N898 Other specified noninflammatory disorders of vagina: Secondary | ICD-10-CM

## 2021-11-07 NOTE — Progress Notes (Signed)
SUBJECTIVE:  21 y.o. female complains of white vaginal discharge, irritation, and odor for 5 day(s). Denies abnormal vaginal bleeding or significant pelvic pain or fever. No UTI symptoms. Denies history of known exposure to STD.  No LMP recorded.  OBJECTIVE:  She appears well, afebrile. Urine dipstick: not done.  ASSESSMENT:  Vaginal Discharge  Vaginal Odor   PLAN:  GC, chlamydia, trichomonas, BVAG, CVAG probe sent to lab. Treatment: To be determined once lab results are received ROV prn if symptoms persist or worsen.   Alexis [name] l Alexis [name], CMA

## 2021-11-08 ENCOUNTER — Other Ambulatory Visit: Payer: Self-pay

## 2021-11-08 DIAGNOSIS — B9689 Other specified bacterial agents as the cause of diseases classified elsewhere: Secondary | ICD-10-CM

## 2021-11-08 MED ORDER — METRONIDAZOLE 500 MG PO TABS: 500.0000 mg | ORAL_TABLET | Freq: Two times a day (BID) | ORAL | 0 refills | Status: DC

## 2021-11-08 NOTE — Progress Notes (Signed)
Pt presented in the office with vaginal discharge and odor on 11/07/21. Pt states she thinks she has BV. Flagyl 500 mg PO BID x 7 days was sent to the pharmacy.  Eevee Borbon l Lisaann Atha, CMA

## 2021-11-12 LAB — CERVICOVAGINAL ANCILLARY ONLY
Bacterial Vaginitis (gardnerella): NEGATIVE
Candida Glabrata: NEGATIVE
Candida Vaginitis: NEGATIVE
Chlamydia: NEGATIVE
Comment: NEGATIVE
Comment: NEGATIVE
Comment: NEGATIVE
Comment: NEGATIVE
Comment: NEGATIVE
Comment: NORMAL
Neisseria Gonorrhea: NEGATIVE
Trichomonas: NEGATIVE

## 2021-11-18 ENCOUNTER — Other Ambulatory Visit: Payer: Self-pay | Admitting: Family Medicine

## 2021-11-18 DIAGNOSIS — N898 Other specified noninflammatory disorders of vagina: Secondary | ICD-10-CM

## 2021-11-18 DIAGNOSIS — B379 Candidiasis, unspecified: Secondary | ICD-10-CM

## 2021-11-22 ENCOUNTER — Other Ambulatory Visit: Payer: Self-pay

## 2021-11-22 DIAGNOSIS — B379 Candidiasis, unspecified: Secondary | ICD-10-CM

## 2021-11-22 DIAGNOSIS — B9689 Other specified bacterial agents as the cause of diseases classified elsewhere: Secondary | ICD-10-CM

## 2021-11-22 DIAGNOSIS — N898 Other specified noninflammatory disorders of vagina: Secondary | ICD-10-CM

## 2021-11-22 MED ORDER — METRONIDAZOLE 500 MG PO TABS: 500.0000 mg | ORAL_TABLET | Freq: Two times a day (BID) | ORAL | 0 refills | Status: DC

## 2021-11-22 MED ORDER — FLUCONAZOLE 150 MG PO TABS: ORAL_TABLET | ORAL | 1 refills | Status: DC

## 2021-11-22 NOTE — Progress Notes (Signed)
Patient called and complaining of fishy odor with discharge. Patient instructed this sound like bacterial vaginosis. Patient requested diflucan as well after she finishes the antibiotic. Armandina Stammer RN

## 2022-01-01 ENCOUNTER — Ambulatory Visit
Admission: EM | Admit: 2022-01-01 | Discharge: 2022-01-01 | Discharge status: Against Medical Advice | Payer: Medicaid Other

## 2022-01-02 ENCOUNTER — Ambulatory Visit (INDEPENDENT_AMBULATORY_CARE_PROVIDER_SITE_OTHER): Payer: Medicaid Other

## 2022-01-02 ENCOUNTER — Encounter: Payer: Self-pay | Admitting: Emergency Medicine

## 2022-01-02 ENCOUNTER — Ambulatory Visit
Admission: EM | Admit: 2022-01-02 | Discharge: 2022-01-02 | Disposition: A | Discharge status: Home / Self Care | Payer: Medicaid Other | Attending: Internal Medicine | Admitting: Internal Medicine

## 2022-01-02 DIAGNOSIS — J209 Acute bronchitis, unspecified: Secondary | ICD-10-CM

## 2022-01-02 DIAGNOSIS — R0602 Shortness of breath: Secondary | ICD-10-CM | POA: Diagnosis not present

## 2022-01-02 DIAGNOSIS — R059 Cough, unspecified: Secondary | ICD-10-CM | POA: Diagnosis not present

## 2022-01-02 DIAGNOSIS — R062 Wheezing: Secondary | ICD-10-CM | POA: Diagnosis not present

## 2022-01-02 MED ORDER — PREDNISONE 10 MG (21) PO TBPK: ORAL_TABLET | Freq: Every day | ORAL | 0 refills | Status: DC

## 2022-01-02 MED ORDER — ALBUTEROL SULFATE HFA 108 (90 BASE) MCG/ACT IN AERS: 1.0000 | INHALATION_SPRAY | Freq: Four times a day (QID) | RESPIRATORY_TRACT | 0 refills | Status: DC | PRN

## 2022-01-02 MED ORDER — BENZONATATE 100 MG PO CAPS: 100.0000 mg | ORAL_CAPSULE | Freq: Three times a day (TID) | ORAL | 0 refills | Status: DC | PRN

## 2022-01-02 NOTE — ED Triage Notes (Signed)
Pt is present today with c/o chest congestion, cough, SOB, and HA. Pt sx started Monday.

## 2022-01-02 NOTE — ED Provider Notes (Signed)
[facility]    CSN: [id number] Arrival date & time: 01/02/22  9628      History   Chief Complaint Chief Complaint  Patient presents with   Shortness of Breath   Headache   Cough   Chills   Nasal Congestion    HPI Alexis [name] is a 21 y.o. female.   Patient presents with nasal congestion, chest congestion, cough, shortness of breath, headache that has been present for about 3 days.  Patient reports that it feels like she has a lot of chest tightness and pressure with associated shortness of breath.  Her children have had similar symptoms recently.  She denies any known fevers.  Denies history of asthma or COPD and patient is not a smoker.  She has taken over-the-counter cold and flu medications with minimal improvement in symptoms.   Shortness of Breath Headache Cough   Past Medical History:  Diagnosis Date   Anxiety    COVID-19    Depression    GERD (gastroesophageal reflux disease)    Tachycardia    no medications-intermittent    Patient Active Problem List   Diagnosis Date Noted   COVID-19    Anxiety    Chronic hypertension 08/27/2020   GERD (gastroesophageal reflux disease)     Past Surgical History:  Procedure Laterality Date   NO PAST SURGERIES      OB History     Gravida  1   Para  1   Term  0   Preterm  1   AB  0   Living  2      SAB  0   IAB  0   Ectopic  0   Multiple  1   Live Births  2            Home Medications    Prior to Admission medications   Medication Sig Start Date End Date Taking? Authorizing Provider  albuterol (VENTOLIN HFA) 108 (90 Base) MCG/ACT inhaler Inhale 1-2 puffs into the lungs every 6 (six) hours as needed for wheezing or shortness of breath. 01/02/22  Yes Eulalie Speights, [place] E, FNP  benzonatate (TESSALON) 100 MG capsule Take 1 capsule (100 mg total) by mouth every 8 (eight) hours as needed for cough. 01/02/22  Yes Hellena Pridgen, Rolly Salter E, FNP  predniSONE (STERAPRED UNI-PAK 21 TAB) 10 MG  (21) TBPK tablet Take by mouth daily. Take 6 tabs by mouth daily  for 2 days, then 5 tabs for 2 days, then 4 tabs for 2 days, then 3 tabs for 2 days, 2 tabs for 2 days, then 1 tab by mouth daily for 2 days 01/02/22  Yes Loreda Silverio, [place] E, FNP  cetirizine (ZYRTEC) 10 MG tablet Take 10 mg by mouth daily.    [provider]  fluconazole (DIFLUCAN) 150 MG tablet TAKE ONE TABLET BY MOUTH. REPEAT IN 3 DAYS IF SYMPTOMS PERSIST. Patient not taking: Reported on 07/23/2021 07/11/21   [name], DO  fluconazole (DIFLUCAN) 150 MG tablet TAKE ONE TABLET BY MOUTH. REPEAT IN 3 DAYS IF SYMPTOMS PERSIST. 12/02/21   [name], DO  fluconazole (DIFLUCAN) 150 MG tablet Take one tablet at the in five days. 11/22/21   [name], DO  ibuprofen (ADVIL) 600 MG tablet Take 1 tablet (600 mg total) by mouth every 6 (six) hours as needed for mild pain. Patient not taking: Reported on 04/10/2021 03/28/21   Gustavus Bryant, FNP  lidocaine (XYLOCAINE) 2 % solution Use as  directed 15 mLs in the mouth or throat as needed for mouth pain. Swish and spit.  May also apply directly to sore. Patient not taking: Reported on 07/24/2021 07/23/21   Gustavus Bryant, FNP  LO LOESTRIN FE 1 MG-10 MCG / 10 MCG tablet Take 1 tablet by mouth daily. 02/07/21   [provider]  metroNIDAZOLE (FLAGYL) 500 MG tablet Take 1 tablet (500 mg total) by mouth 2 (two) times daily. 11/22/21   [name], DO  naproxen (NAPROSYN) 375 MG tablet Take 1 tablet (375 mg total) by mouth 2 (two) times daily with a meal. Patient not taking: Reported on 02/22/2021 02/15/21   Wallis Bamberg, PA-C  nitrofurantoin, macrocrystal-monohydrate, (MACROBID) 100 MG capsule Take 1 capsule (100 mg total) by mouth 2 (two) times daily. Patient not taking: Reported on 07/23/2021 06/04/21   Aviva Signs, CNM  pantoprazole (PROTONIX) 40 MG tablet Take 40 mg by mouth daily. Patient not taking: Reported on 04/10/2021    [provider]  Prenatal Vit-Fe  Fumarate-FA (PRENATAL VITAMINS PO) Take by mouth. Patient not taking: Reported on 05/28/2021    [provider]  pseudoephedrine (SUDAFED) 60 MG tablet Take 1 tablet (60 mg total) by mouth every 8 (eight) hours as needed for congestion. Patient not taking: Reported on 04/10/2021 03/11/21   Wallis Bamberg, PA-C    Family History Family History  Problem Relation Age of Onset   Diabetes Mellitus II Mother    Hypertension Mother    Hypertension Father    Stomach cancer Maternal Aunt    Stomach cancer Paternal Grandmother     Social History Social History   Tobacco Use   Smoking status: Never   Smokeless tobacco: Never  Vaping Use   Vaping Use: Never used  Substance Use Topics   Alcohol use: Not Currently   Drug use: Not Currently     Allergies   Dust mite extract, Mixed ragweed, and Venofer [iron sucrose]   Review of Systems Review of Systems Per HPI  Physical Exam Triage Vital Signs ED Triage Vitals  Enc Vitals Group     BP 01/02/22 1029 116/79     Pulse Rate 01/02/22 1029 95     Resp 01/02/22 1029 18     Temp 01/02/22 1029 98.1 F (36.7 C)     Temp src --      SpO2 01/02/22 1029 100 %     Weight --      Height --      Head Circumference --      Peak Flow --      Pain Score 01/02/22 1027 6     Pain Loc --      Pain Edu? --      Excl. in GC? --    No data found.  Updated Vital Signs BP 116/79   Pulse 95   Temp 98.1 F (36.7 C)   Resp 18   LMP 12/31/2021   SpO2 100%   Breastfeeding No   Visual Acuity Right Eye Distance:   Left Eye Distance:   Bilateral Distance:    Right Eye Near:   Left Eye Near:    Bilateral Near:     Physical Exam Constitutional:      General: She is not in acute distress.    Appearance: Normal appearance. She is not toxic-appearing or diaphoretic.  HENT:     Head: Normocephalic and atraumatic.     Right Ear: Tympanic membrane and ear canal normal.  Left Ear: Tympanic membrane and ear canal normal.     Nose:  Congestion present.     Mouth/Throat:     Mouth: Mucous membranes are moist.     Pharynx: No posterior oropharyngeal erythema.  Eyes:     Extraocular Movements: Extraocular movements intact.     Conjunctiva/sclera: Conjunctivae normal.     Pupils: Pupils are equal, round, and reactive to light.  Cardiovascular:     Rate and Rhythm: Normal rate and regular rhythm.     Pulses: Normal pulses.     Heart sounds: Normal heart sounds.  Pulmonary:     Effort: Pulmonary effort is normal. No respiratory distress.     Breath sounds: No stridor. Wheezing and rhonchi present. No rales.     Comments: Mild wheezing and rhonchi noted on exam.  Abdominal:     General: Abdomen is flat. Bowel sounds are normal.     Palpations: Abdomen is soft.  Musculoskeletal:        General: Normal range of motion.     Cervical back: Normal range of motion.  Skin:    General: Skin is warm and dry.  Neurological:     General: No focal deficit present.     Mental Status: She is alert and oriented to person, place, and time. Mental status is at baseline.  Psychiatric:        Mood and Affect: Mood normal.        Behavior: Behavior normal.      UC Treatments / Results  Labs (all labs ordered are listed, but only abnormal results are displayed) Labs Reviewed  NOVEL CORONAVIRUS, NAA    EKG   Radiology DG Chest 2 View  Result Date: 01/02/2022 CLINICAL DATA:  Cough, congestion and shortness of breath. EXAM: CHEST - 2 VIEW COMPARISON:  Chest radiograph July 24, 2021. FINDINGS: The heart size and mediastinal contours are within normal limits. Perihilar predominant peribronchial cuffing/interstitial opacities suggesting viral process or reactive airways disease in the appropriate clinical setting. No focal airspace consolidation. No pleural effusion. No pneumothorax. The visualized skeletal structures are unremarkable. IMPRESSION: Perihilar predominant peribronchial cuffing/interstitial opacities suggesting  viral process or reactive airways disease in the appropriate clinical setting. No focal airspace consolidation. Electronically Signed   By: Maudry Mayhew M.D.   On: 01/02/2022 11:09    Procedures Procedures (including critical care time)  Medications Ordered in UC Medications - No data to display  Initial Impression / Assessment and Plan / UC Course  I have reviewed the triage vital signs and the nursing notes.  Pertinent labs & imaging results that were available during my care of the patient were reviewed by me and considered in my medical decision making (see chart for details).     Chest x-ray showing signs of acute bronchitis.  No signs of pneumonia.  Suspect viral cause of patient's symptoms.  Will treat with prednisone steroid taper, albuterol inhaler, benzonatate to take as needed for cough.  Patient denies breastfeeding so these medications should be safe. Discussed supportive care with patient.  No signs of respiratory stress and oxygen is normal so do not think that emergent evaluation in the hospital is necessary.  COVID test pending.  Patient was given strict return and ER precautions.  Patient verbalized understanding and was agreeable with plan. Final Clinical Impressions(s) / UC Diagnoses   Final diagnoses:  Acute bronchitis, unspecified organism  Wheezing     Discharge Instructions      Chest x-ray showing signs of  acute bronchitis.  You are being treated with 3 different medications to help alleviate symptoms.  Please follow-up if symptoms persist or worsen.     ED Prescriptions     Medication Sig Dispense Auth. Provider   albuterol (VENTOLIN HFA) 108 (90 Base) MCG/ACT inhaler Inhale 1-2 puffs into the lungs every 6 (six) hours as needed for wheezing or shortness of breath. 1 each [place], Rolly Salter E, [place]   predniSONE (STERAPRED UNI-PAK 21 TAB) 10 MG (21) TBPK tablet Take by mouth daily. Take 6 tabs by mouth daily  for 2 days, then 5 tabs for 2 days, then 4 tabs for  2 days, then 3 tabs for 2 days, 2 tabs for 2 days, then 1 tab by mouth daily for 2 days 42 tablet Ilsa Bonello, [place] E, [place]   benzonatate (TESSALON) 100 MG capsule Take 1 capsule (100 mg total) by mouth every 8 (eight) hours as needed for cough. 21 capsule [place], Acie Fredrickson, [place]      PDMP not reviewed this encounter.   Gustavus Bryant, [place] 01/02/22 1120

## 2022-01-02 NOTE — Discharge Instructions (Signed)
Chest x-ray showing signs of acute bronchitis.  You are being treated with 3 different medications to help alleviate symptoms.  Please follow-up if symptoms persist or worsen.

## 2022-01-04 LAB — NOVEL CORONAVIRUS, NAA: SARS-CoV-2, NAA: NOT DETECTED

## 2022-01-06 ENCOUNTER — Other Ambulatory Visit: Payer: Self-pay | Admitting: Family Medicine

## 2022-01-06 DIAGNOSIS — N898 Other specified noninflammatory disorders of vagina: Secondary | ICD-10-CM

## 2022-01-06 DIAGNOSIS — B379 Candidiasis, unspecified: Secondary | ICD-10-CM

## 2022-01-10 ENCOUNTER — Ambulatory Visit (INDEPENDENT_AMBULATORY_CARE_PROVIDER_SITE_OTHER): Payer: Medicaid Other

## 2022-01-10 VITALS — BP 122/85 | HR 71 | Ht 65.0 in

## 2022-01-10 DIAGNOSIS — R399 Unspecified symptoms and signs involving the genitourinary system: Secondary | ICD-10-CM

## 2022-01-10 DIAGNOSIS — R300 Dysuria: Secondary | ICD-10-CM

## 2022-01-10 DIAGNOSIS — R350 Frequency of micturition: Secondary | ICD-10-CM | POA: Diagnosis not present

## 2022-01-10 LAB — POCT URINALYSIS DIPSTICK
Bilirubin, UA: 0.2
Blood, UA: NEGATIVE
Glucose, UA: NEGATIVE
Ketones, UA: NEGATIVE
Leukocytes, UA: NEGATIVE
Nitrite, UA: NEGATIVE
Protein, UA: NEGATIVE
Spec Grav, UA: 1.02 (ref 1.010–1.025)
Urobilinogen, UA: 0.2 E.U./dL
pH, UA: 7 (ref 5.0–8.0)

## 2022-01-10 NOTE — Progress Notes (Signed)
SUBJECTIVE: Alexis [name] is a 21 y.o. female who complains of urinary frequency, urgency and dysuria x 4 days, without flank pain, fever, chills, or abnormal vaginal discharge or bleeding.   OBJECTIVE: Appears well, in no apparent distress.  Vital signs are normal. Urine dipstick shows negative for all components.    ASSESSMENT: Dysuria  PLAN: Urine sent for culture.  Call or return to clinic prn if these symptoms worsen or fail to improve as anticipated.

## 2022-01-12 LAB — CULTURE, OB URINE

## 2022-01-12 LAB — URINE CULTURE, OB REFLEX

## 2022-01-16 ENCOUNTER — Other Ambulatory Visit (HOSPITAL_COMMUNITY)
Admission: RE | Admit: 2022-01-16 | Discharge: 2022-01-16 | Disposition: A | Discharge status: Home / Self Care | Payer: Medicaid Other | Source: Ambulatory Visit | Attending: Family Medicine | Admitting: Family Medicine

## 2022-01-16 ENCOUNTER — Ambulatory Visit (INDEPENDENT_AMBULATORY_CARE_PROVIDER_SITE_OTHER): Payer: Medicaid Other

## 2022-01-16 ENCOUNTER — Ambulatory Visit: Payer: Medicaid Other

## 2022-01-16 VITALS — BP 129/74 | HR 87 | Wt 157.0 lb

## 2022-01-16 DIAGNOSIS — N898 Other specified noninflammatory disorders of vagina: Secondary | ICD-10-CM

## 2022-01-16 DIAGNOSIS — R399 Unspecified symptoms and signs involving the genitourinary system: Secondary | ICD-10-CM

## 2022-01-16 DIAGNOSIS — Z202 Contact with and (suspected) exposure to infections with a predominantly sexual mode of transmission: Secondary | ICD-10-CM

## 2022-01-16 LAB — POCT URINALYSIS DIPSTICK
Bilirubin, UA: NEGATIVE
Blood, UA: NEGATIVE
Glucose, UA: NEGATIVE
Ketones, UA: NEGATIVE
Leukocytes, UA: NEGATIVE
Nitrite, UA: NEGATIVE
Protein, UA: NEGATIVE
Spec Grav, UA: 1.01 (ref 1.010–1.025)
Urobilinogen, UA: 0.2 E.U./dL
pH, UA: 7.5 (ref 5.0–8.0)

## 2022-01-16 NOTE — Progress Notes (Signed)
SUBJECTIVE:  21 y.o. female complains of white vaginal discharge for 2 day(s). Denies abnormal vaginal bleeding or significant pelvic pain or fever. No UTI symptoms. Patient states she was exposed to unknown STD.  Patient's last menstrual period was 12/31/2021 (exact date).  OBJECTIVE:  She appears well, afebrile. Urine dipstick: negative for all components.  ASSESSMENT:  Vaginal Discharge     PLAN:  GC, chlamydia, trichomonas, BVAG, CVAG probe sent to lab. Treatment: To be determined once lab results are received ROV prn if symptoms persist or worsen.

## 2022-01-17 LAB — HEPATITIS B SURFACE ANTIGEN: Hepatitis B Surface Ag: NEGATIVE

## 2022-01-17 LAB — CERVICOVAGINAL ANCILLARY ONLY
Bacterial Vaginitis (gardnerella): NEGATIVE
Candida Glabrata: NEGATIVE
Candida Vaginitis: NEGATIVE
Chlamydia: NEGATIVE
Comment: NEGATIVE
Comment: NEGATIVE
Comment: NEGATIVE
Comment: NEGATIVE
Comment: NEGATIVE
Comment: NORMAL
Neisseria Gonorrhea: NEGATIVE
Trichomonas: NEGATIVE

## 2022-01-17 LAB — HIV ANTIBODY (ROUTINE TESTING W REFLEX): HIV Screen 4th Generation wRfx: NONREACTIVE

## 2022-01-17 LAB — HEPATITIS C ANTIBODY: Hep C Virus Ab: NONREACTIVE

## 2022-01-17 LAB — RPR: RPR Ser Ql: NONREACTIVE

## 2022-02-04 ENCOUNTER — Ambulatory Visit: Payer: Medicaid Other

## 2022-02-04 NOTE — Progress Notes (Deleted)
Subjective:    Alexis [name] is a 21 y.o. female who presents for evaluation of amenorrhea. She believes she could be pregnant. {pregnancy desire:14500::"Pregnancy is desired."}  Last period was {norm/abn:16337}.   Patient's last menstrual period was 12/31/2021 (exact date).    Lab Review Urine HCG: {pos/neg:315643}    Assessment:    Absence of menstruation.     Plan:    Pregnancy Test:  Positive: EDC: ***. Briefly discussed positive results and sent to check out for scheduling of New OB appointments.

## 2022-02-05 ENCOUNTER — Encounter (HOSPITAL_COMMUNITY): Payer: Self-pay

## 2022-02-05 ENCOUNTER — Inpatient Hospital Stay (HOSPITAL_COMMUNITY): Payer: Medicaid Other

## 2022-02-05 ENCOUNTER — Inpatient Hospital Stay (HOSPITAL_COMMUNITY)
Admission: AD | Admit: 2022-02-05 | Discharge: 2022-02-05 | Disposition: A | Discharge status: Home / Self Care | Payer: Medicaid Other | Attending: Obstetrics and Gynecology | Admitting: Obstetrics and Gynecology

## 2022-02-05 ENCOUNTER — Other Ambulatory Visit: Payer: Self-pay

## 2022-02-05 DIAGNOSIS — Z3A01 Less than 8 weeks gestation of pregnancy: Secondary | ICD-10-CM | POA: Diagnosis not present

## 2022-02-05 DIAGNOSIS — O26891 Other specified pregnancy related conditions, first trimester: Secondary | ICD-10-CM | POA: Insufficient documentation

## 2022-02-05 DIAGNOSIS — Z3491 Encounter for supervision of normal pregnancy, unspecified, first trimester: Secondary | ICD-10-CM

## 2022-02-05 DIAGNOSIS — R109 Unspecified abdominal pain: Secondary | ICD-10-CM | POA: Diagnosis not present

## 2022-02-05 LAB — URINALYSIS, ROUTINE W REFLEX MICROSCOPIC
Bilirubin Urine: NEGATIVE
Glucose, UA: NEGATIVE mg/dL
Hgb urine dipstick: NEGATIVE
Ketones, ur: NEGATIVE mg/dL
Leukocytes,Ua: NEGATIVE
Nitrite: NEGATIVE
Protein, ur: NEGATIVE mg/dL
Specific Gravity, Urine: 1.008 (ref 1.005–1.030)
pH: 7 (ref 5.0–8.0)

## 2022-02-05 LAB — HIV ANTIBODY (ROUTINE TESTING W REFLEX): HIV Screen 4th Generation wRfx: NONREACTIVE

## 2022-02-05 LAB — CBC
HCT: 34.8 % — ABNORMAL LOW (ref 36.0–46.0)
Hemoglobin: 11.4 g/dL — ABNORMAL LOW (ref 12.0–15.0)
MCH: 30.8 pg (ref 26.0–34.0)
MCHC: 32.8 g/dL (ref 30.0–36.0)
MCV: 94.1 fL (ref 80.0–100.0)
Platelets: 226 10*3/uL (ref 150–400)
RBC: 3.7 MIL/uL — ABNORMAL LOW (ref 3.87–5.11)
RDW: 13 % (ref 11.5–15.5)
WBC: 5 10*3/uL (ref 4.0–10.5)
nRBC: 0 % (ref 0.0–0.2)

## 2022-02-05 LAB — WET PREP, GENITAL
Clue Cells Wet Prep HPF POC: NONE SEEN
Sperm: NONE SEEN
Trich, Wet Prep: NONE SEEN
WBC, Wet Prep HPF POC: >=10 — AB (ref <10)
Yeast Wet Prep HPF POC: NONE SEEN

## 2022-02-05 LAB — POCT PREGNANCY, URINE: Preg Test, Ur: POSITIVE — AB

## 2022-02-05 LAB — HCG, QUANTITATIVE, PREGNANCY: hCG, Beta Chain, Quant, S: 4972 m[IU]/mL — ABNORMAL HIGH (ref <5)

## 2022-02-05 LAB — ABO/RH: ABO/RH(D): O POS

## 2022-02-05 NOTE — MAU Note (Signed)
Alexis [name] is a 21 y.o. at [redacted]w[redacted]d here in MAU reporting: lower abdomen and back cramping. Also having cramping under her right rib. Cramping has been going on since last Friday when she had a + UPT. No bleeding. Increased mucus discharge but no odor or itching.  LMP: 12/30/2021  Onset of complaint: ongoing  Pain score: abdomen and back 7/10, ruq 9/10  Vitals:   02/05/22 1110  BP: 119/71  Pulse: (!) 107  Resp: 16  Temp: 99.5 F (37.5 C)  SpO2: 98%     Lab orders placed from triage: upt, ua

## 2022-02-05 NOTE — MAU Provider Note (Signed)
Chief Complaint: Abdominal Pain and Back Pain   Event Date/Time   First Provider Initiated Contact with Patient 02/05/22 1231      SUBJECTIVE HPI: Alexis [name] is a 21 y.o. G2P0102 at [redacted]w[redacted]d by LMP who presents to maternity admissions reporting lower abdominal and back cramping x 4-5 days.   She denies vaginal bleeding, vaginal itching/burning, urinary symptoms, h/a, dizziness, n/v, or fever/chills.       HPI  Past Medical History:  Diagnosis Date   Anxiety    COVID-19    Depression    GERD (gastroesophageal reflux disease)    Tachycardia    no medications-intermittent   Past Surgical History:  Procedure Laterality Date   NO PAST SURGERIES     Social History   Socioeconomic History   Marital status: Single    Spouse name: Not on file   Number of children: Not on file   Years of education: Not on file   Highest education level: Not on file  Occupational History   Not on file  Tobacco Use   Smoking status: Never   Smokeless tobacco: Never  Vaping Use   Vaping Use: Never used  Substance and Sexual Activity   Alcohol use: Not Currently   Drug use: Not Currently   Sexual activity: Yes    Birth control/protection: None  Other Topics Concern   Not on file  Social History Narrative   Not on file   Social Determinants of Health   Financial Resource Strain: Not on file  Food Insecurity: Not on file  Transportation Needs: Not on file  Physical Activity: Not on file  Stress: Not on file  Social Connections: Not on file  Intimate Partner Violence: Not on file   No current facility-administered medications on file prior to encounter.   Current Outpatient Medications on File Prior to Encounter  Medication Sig Dispense Refill   albuterol (VENTOLIN HFA) 108 (90 Base) MCG/ACT inhaler Inhale 1-2 puffs into the lungs every 6 (six) hours as needed for wheezing or shortness of breath. 1 each 0   benzonatate (TESSALON) 100 MG capsule Take 1 capsule (100 mg  total) by mouth every 8 (eight) hours as needed for cough. (Patient not taking: Reported on 01/16/2022) 21 capsule 0   cetirizine (ZYRTEC) 10 MG tablet Take 10 mg by mouth daily.     fluconazole (DIFLUCAN) 150 MG tablet TAKE ONE TABLET BY MOUTH. REPEAT IN 3 DAYS IF SYMPTOMS PERSIST. (Patient not taking: Reported on 07/23/2021) 1 tablet 1   fluconazole (DIFLUCAN) 150 MG tablet TAKE ONE TABLET BY MOUTH. REPEAT IN 3 DAYS IF SYMPTOMS PERSIST. (Patient not taking: Reported on 01/10/2022) 1 tablet 1   fluconazole (DIFLUCAN) 150 MG tablet TAKE ONE TABLET AT THE IN FIVE DAYS. (Patient not taking: Reported on 01/10/2022) 1 tablet 1   ibuprofen (ADVIL) 600 MG tablet Take 1 tablet (600 mg total) by mouth every 6 (six) hours as needed for mild pain. (Patient not taking: Reported on 04/10/2021) 30 tablet 0   lidocaine (XYLOCAINE) 2 % solution Use as directed 15 mLs in the mouth or throat as needed for mouth pain. Swish and spit.  May also apply directly to sore. (Patient not taking: Reported on 07/24/2021) 100 mL 0   LO LOESTRIN FE 1 MG-10 MCG / 10 MCG tablet Take 1 tablet by mouth daily. (Patient not taking: Reported on 01/10/2022)     metroNIDAZOLE (FLAGYL) 500 MG tablet Take 1 tablet (500 mg total) by mouth 2 (two) times  daily. (Patient not taking: Reported on 01/10/2022) 14 tablet 0   naproxen (NAPROSYN) 375 MG tablet Take 1 tablet (375 mg total) by mouth 2 (two) times daily with a meal. (Patient not taking: Reported on 02/22/2021) 30 tablet 0   nitrofurantoin, macrocrystal-monohydrate, (MACROBID) 100 MG capsule Take 1 capsule (100 mg total) by mouth 2 (two) times daily. (Patient not taking: Reported on 07/23/2021) 14 capsule 0   pantoprazole (PROTONIX) 40 MG tablet Take 40 mg by mouth daily. (Patient not taking: Reported on 04/10/2021)     predniSONE (STERAPRED UNI-PAK 21 TAB) 10 MG (21) TBPK tablet Take by mouth daily. Take 6 tabs by mouth daily  for 2 days, then 5 tabs for 2 days, then 4 tabs for 2 days, then 3 tabs for 2  days, 2 tabs for 2 days, then 1 tab by mouth daily for 2 days (Patient not taking: Reported on 01/16/2022) 42 tablet 0   Prenatal Vit-Fe Fumarate-FA (PRENATAL VITAMINS PO) Take by mouth. (Patient not taking: Reported on 05/28/2021)     pseudoephedrine (SUDAFED) 60 MG tablet Take 1 tablet (60 mg total) by mouth every 8 (eight) hours as needed for congestion. (Patient not taking: Reported on 04/10/2021) 30 tablet 0   Allergies  Allergen Reactions   Dust Mite Extract Hives   Mixed Ragweed Hives   Venofer [Iron Sucrose] Other (See Comments)    Chest pain    ROS:  Review of Systems  Constitutional:  Negative for chills, fatigue and fever.  Respiratory:  Negative for shortness of breath.   Cardiovascular:  Negative for chest pain.  Gastrointestinal:  Positive for abdominal pain.  Genitourinary:  Negative for difficulty urinating, dysuria, flank pain, pelvic pain, vaginal bleeding, vaginal discharge and vaginal pain.  Neurological:  Negative for dizziness and headaches.  Psychiatric/Behavioral: Negative.       I have reviewed patient's Past Medical Hx, Surgical Hx, Family Hx, Social Hx, medications and allergies.   Physical Exam  Patient Vitals for the past 24 hrs:  BP Temp Temp src Pulse Resp SpO2 Height Weight  02/05/22 1125 131/65 -- -- (!) 103 18 100 % -- --  02/05/22 1110 119/71 99.5 F (37.5 C) Oral (!) 107 16 98 % 5\' 5"  (1.651 m) 72.8 kg   Constitutional: Well-developed, well-nourished female in no acute distress.  Cardiovascular: normal rate Respiratory: normal effort GI: Abd soft, non-tender. Pos BS x 4 MS: Extremities nontender, no edema, normal ROM Neurologic: Alert and oriented x 4.  GU: Neg CVAT.  PELVIC EXAM: vaginal cultures collected by blind swab    LAB RESULTS Results for orders placed or performed during the hospital encounter of 02/05/22 (from the past 24 hour(s))  Urinalysis, Routine w reflex microscopic     Status: Abnormal   Collection Time: 02/05/22  11:05 AM  Result Value Ref Range   Color, Urine STRAW (A) YELLOW   APPearance CLEAR CLEAR   Specific Gravity, Urine 1.008 1.005 - 1.030   pH 7.0 5.0 - 8.0   Glucose, UA NEGATIVE NEGATIVE mg/dL   Hgb urine dipstick NEGATIVE NEGATIVE   Bilirubin Urine NEGATIVE NEGATIVE   Ketones, ur NEGATIVE NEGATIVE mg/dL   Protein, ur NEGATIVE NEGATIVE mg/dL   Nitrite NEGATIVE NEGATIVE   Leukocytes,Ua NEGATIVE NEGATIVE  Pregnancy, urine POC     Status: Abnormal   Collection Time: 02/05/22 11:05 AM  Result Value Ref Range   Preg Test, Ur POSITIVE (A) NEGATIVE  CBC     Status: Abnormal   Collection Time: 02/05/22  12:09 PM  Result Value Ref Range   WBC 5.0 4.0 - 10.5 K/uL   RBC 3.70 (L) 3.87 - 5.11 MIL/uL   Hemoglobin 11.4 (L) 12.0 - 15.0 g/dL   HCT 12.8 (L) 78.6 - 76.7 %   MCV 94.1 80.0 - 100.0 fL   MCH 30.8 26.0 - 34.0 pg   MCHC 32.8 30.0 - 36.0 g/dL   RDW 20.9 47.0 - 96.2 %   Platelets 226 150 - 400 K/uL   nRBC 0.0 0.0 - 0.2 %       IMAGING No results found. US OB LESS THAN 14 WEEKS WITH OB TRANSVAGINAL  Result Date: 02/05/2022 CLINICAL DATA:  Lower back and back pain for 5 days, first trimester pregnancy EXAM: OBSTETRIC <14 WK [place] AND TRANSVAGINAL OB US TECHNIQUE: Both transabdominal and transvaginal ultrasound examinations were performed for complete evaluation of the gestation as well as the maternal uterus, adnexal regions, and pelvic cul-de-sac. Transvaginal technique was performed to assess early pregnancy. COMPARISON:  None Available. FINDINGS: Intrauterine gestational sac: Present, single Yolk sac:  Present Embryo:  Questionably present Cardiac Activity: Not identified Heart Rate: N/A  bpm CRL: 1.3 mm w d [place] EDC: * below chart range Subchorionic hemorrhage:  None visualized. Maternal uterus/adnexae: Uterus otherwise unremarkable. Corpus luteal cyst LEFT ovary. Ovaries otherwise normal appearance. Trace free pelvic fluid. No adnexal masses. IMPRESSION: Early intrauterine gestation  containing a yolk sac and questionably tiny fetal pole. No fetal cardiac activity is detected; may assess viability on follow-up ultrasound in 14 days if clinically indicated. Remainder of exam unremarkable. Electronically Signed   By: Ulyses Southward M.D.   On: 02/05/2022 13:33   DG Chest 2 View  Result Date: 01/02/2022 CLINICAL DATA:  Cough, congestion and shortness of breath. EXAM: CHEST - 2 VIEW COMPARISON:  Chest radiograph July 24, 2021. FINDINGS: The heart size and mediastinal contours are within normal limits. Perihilar predominant peribronchial cuffing/interstitial opacities suggesting viral process or reactive airways disease in the appropriate clinical setting. No focal airspace consolidation. No pleural effusion. No pneumothorax. The visualized skeletal structures are unremarkable. IMPRESSION: Perihilar predominant peribronchial cuffing/interstitial opacities suggesting viral process or reactive airways disease in the appropriate clinical setting. No focal airspace consolidation. Electronically Signed   By: Maudry Mayhew M.D.   On: 01/02/2022 11:09    MAU Management/MDM: Orders Placed This Encounter  Procedures   Wet prep, genital   US OB LESS THAN 14 WEEKS WITH OB TRANSVAGINAL   Urinalysis, Routine w reflex microscopic Urine, Clean Catch   CBC   hCG, quantitative, pregnancy   HIV Antibody (routine testing w rflx)   Pregnancy, urine POC   ABO/Rh    No orders of the defined types were placed in this encounter.   IUP on today's [place], pt to f/u with prenatal care as scheduled.  Return to MAU as needed for emergencies.  ASSESSMENT  1. Normal intrauterine pregnancy on prenatal ultrasound in first trimester   2. [redacted] weeks gestation of pregnancy   3. Abdominal pain during pregnancy in first trimester     PLAN Discharge home     Sharen Counter Certified Nurse-Midwife 02/05/2022  12:31 PM

## 2022-02-05 NOTE — Discharge Instructions (Signed)

## 2022-02-06 LAB — GC/CHLAMYDIA PROBE AMP (~~LOC~~) NOT AT ARMC
Chlamydia: NEGATIVE
Comment: NEGATIVE
Comment: NORMAL
Neisseria Gonorrhea: NEGATIVE

## 2022-02-09 ENCOUNTER — Ambulatory Visit
Admission: EM | Admit: 2022-02-09 | Discharge: 2022-02-09 | Disposition: A | Discharge status: Home / Self Care | Payer: Medicaid Other | Attending: Emergency Medicine | Admitting: Emergency Medicine

## 2022-02-09 DIAGNOSIS — B349 Viral infection, unspecified: Secondary | ICD-10-CM | POA: Diagnosis present

## 2022-02-09 DIAGNOSIS — J029 Acute pharyngitis, unspecified: Secondary | ICD-10-CM | POA: Diagnosis present

## 2022-02-09 DIAGNOSIS — Z20822 Contact with and (suspected) exposure to covid-19: Secondary | ICD-10-CM | POA: Diagnosis not present

## 2022-02-09 LAB — POCT RAPID STREP A (OFFICE): Rapid Strep A Screen: NEGATIVE

## 2022-02-09 LAB — RESP PANEL BY RT-PCR (FLU A&B, COVID) ARPGX2
Influenza A by PCR: NEGATIVE
Influenza B by PCR: NEGATIVE
SARS Coronavirus 2 by RT PCR: NEGATIVE

## 2022-02-09 NOTE — ED Triage Notes (Signed)
Pt c/o sore throat, body aches, chills, cough, and congestion for 3 days. Home interventions: tylenol  The patient is [redacted] weeks pregnant.

## 2022-02-09 NOTE — Discharge Instructions (Signed)
Your symptoms and physical exam findings are concerning for a viral respiratory infection.     The result of your COVID-19 and influenza PCR testing will be posted to your MyChart once it is complete, typically this takes 6 to 12 hours.  If there is a positive result, recommend that you take the appropriate antiviral treatment.  They are both recommended in patients who are pregnant.   Your strep test today is negative.  Streptococcal throat culture will be performed per our protocol.  The result of your throat culture will be posted to your MyChart once it is complete, this typically takes 3 to 5 days.  If your streptococcal throat culture is positive, you will be contacted by phone and antibiotics will be prescribed for you.     I have enclosed a list of medications that are safe to take during pregnancy that has been compiled by the OB/GYN group here at [facility].    Please follow-up within the next 5-7 days if your symptoms do not resolve or worsen.     Thank you for visiting urgent care today.  We appreciate the opportunity to participate in your care.

## 2022-02-09 NOTE — ED Provider Notes (Signed)
[facility]    CSN: [id number] Arrival date & time: 02/09/22  1110    HISTORY   Chief Complaint  Patient presents with   Sore Throat   Generalized Body Aches   Cough   Chills   HPI Alexis [name] is a pleasant, 21 y.o. female who presents to urgent care today. Pt c/o sore throat, body aches, chills, dry cough, clear rhinorrhea and nasal congestion for 3 days.  Home interventions: tylenol, patient states she is [redacted] weeks pregnant.  Patient denies known sick contacts but states she has alert twins at home go to daycare.  Patient denies nausea, vomiting, diarrhea, loss of taste or smell, headache.  The history is provided by the patient.   Past Medical History:  Diagnosis Date   Anxiety    COVID-19    Depression    GERD (gastroesophageal reflux disease)    Tachycardia    no medications-intermittent   Patient Active Problem List   Diagnosis Date Noted   COVID-19    Anxiety    Chronic hypertension 08/27/2020   GERD (gastroesophageal reflux disease)    Past Surgical History:  Procedure Laterality Date   NO PAST SURGERIES     OB History     Gravida  2   Para  1   Term  0   Preterm  1   AB  0   Living  2      SAB  0   IAB  0   Ectopic  0   Multiple  1   Live Births  2          Home Medications    Prior to Admission medications   Medication Sig Start Date End Date Taking? Authorizing Provider  albuterol (VENTOLIN HFA) 108 (90 Base) MCG/ACT inhaler Inhale 1-2 puffs into the lungs every 6 (six) hours as needed for wheezing or shortness of breath. 01/02/22   Gustavus Bryant, FNP  cetirizine (ZYRTEC) 10 MG tablet Take 10 mg by mouth daily.    [provider]    Family History Family History  Problem Relation Age of Onset   Diabetes Mother    Diabetes Mellitus II Mother    Hypertension Mother    Arthritis Father    Hypertension Father    Stomach cancer Maternal Aunt    Stomach cancer Paternal Grandmother    Social  History Social History   Tobacco Use   Smoking status: Never   Smokeless tobacco: Never  Vaping Use   Vaping Use: Never used  Substance Use Topics   Alcohol use: Not Currently   Drug use: Not Currently   Allergies   Dust mite extract, Mixed ragweed, and Venofer [iron sucrose]  Review of Systems Review of Systems Pertinent findings revealed after performing a 14 point review of systems has been noted in the history of present illness.  Physical Exam Triage Vital Signs ED Triage Vitals  Enc Vitals Group     BP 04/12/21 0827 (!) 147/82     Pulse Rate 04/12/21 0827 72     Resp 04/12/21 0827 18     Temp 04/12/21 0827 98.3 F (36.8 C)     Temp Source 04/12/21 0827 Oral     SpO2 04/12/21 0827 98 %     Weight --      Height --      Head Circumference --      Peak Flow --      Pain Score 04/12/21 0826  5     Pain Loc --      Pain Edu? --      Excl. in GC? --   No data found.  Updated Vital Signs BP 136/83 (BP Location: Right Arm)   Pulse (!) 109   Temp 99.8 F (37.7 C) (Oral)   Resp 17   LMP 12/30/2021   SpO2 98%   Physical Exam Vitals and nursing note reviewed.  Constitutional:      General: She is not in acute distress.    Appearance: Normal appearance. She is not ill-appearing.  HENT:     Head: Normocephalic and atraumatic.     Salivary Glands: Right salivary gland is not diffusely enlarged or tender. Left salivary gland is not diffusely enlarged or tender.     Right Ear: Tympanic membrane, ear canal and external ear normal. No drainage. No middle ear effusion. There is no impacted cerumen. Tympanic membrane is not erythematous or bulging.     Left Ear: Tympanic membrane, ear canal and external ear normal. No drainage.  No middle ear effusion. There is no impacted cerumen. Tympanic membrane is not erythematous or bulging.     Nose: Nose normal. No nasal deformity, septal deviation, mucosal edema, congestion or rhinorrhea.     Right Turbinates: Not enlarged,  swollen or pale.     Left Turbinates: Not enlarged, swollen or pale.     Right Sinus: No maxillary sinus tenderness or frontal sinus tenderness.     Left Sinus: No maxillary sinus tenderness or frontal sinus tenderness.     Mouth/Throat:     Lips: Pink. No lesions.     Mouth: Mucous membranes are moist. No oral lesions.     Pharynx: Oropharynx is clear. Uvula midline. No posterior oropharyngeal erythema or uvula swelling.     Tonsils: No tonsillar exudate. 0 on the right. 0 on the left.  Eyes:     General: Lids are normal.        Right eye: No discharge.        Left eye: No discharge.     Extraocular Movements: Extraocular movements intact.     Conjunctiva/sclera: Conjunctivae normal.     Right eye: Right conjunctiva is not injected.     Left eye: Left conjunctiva is not injected.  Neck:     Trachea: Trachea and phonation normal.  Cardiovascular:     Rate and Rhythm: Normal rate and regular rhythm.     Pulses: Normal pulses.     Heart sounds: Normal heart sounds. No murmur heard.    No friction rub. No gallop.  Pulmonary:     Effort: Pulmonary effort is normal. No accessory muscle usage, prolonged expiration or respiratory distress.     Breath sounds: Normal breath sounds. No stridor, decreased air movement or transmitted upper airway sounds. No decreased breath sounds, wheezing, rhonchi or rales.  Chest:     Chest wall: No tenderness.  Musculoskeletal:        General: Normal range of motion.     Cervical back: Normal range of motion and neck supple. Normal range of motion.  Lymphadenopathy:     Cervical: No cervical adenopathy.  Skin:    General: Skin is warm and dry.     Findings: No erythema or rash.  Neurological:     General: No focal deficit present.     Mental Status: She is alert and oriented to person, place, and time.  Psychiatric:        Mood  and Affect: Mood normal.        Behavior: Behavior normal.     Visual Acuity Right Eye Distance:   Left Eye  Distance:   Bilateral Distance:    Right Eye Near:   Left Eye Near:    Bilateral Near:     UC Couse / Diagnostics / Procedures:     Radiology No results found.  Procedures Procedures (including critical care time) EKG  Pending results:  Labs Reviewed  CULTURE, GROUP A STREP ([place])  RESP PANEL BY RT-PCR (FLU A&B, COVID) [id number]  POCT RAPID STREP A (OFFICE)    Medications Ordered in UC: Medications - No data to display  UC Diagnoses / Final Clinical Impressions(s)   I have reviewed the triage vital signs and the nursing notes.  Pertinent labs & imaging results that were available during my care of the patient were reviewed by me and considered in my medical decision making (see chart for details).    Final diagnoses:  Acute pharyngitis, unspecified etiology  Viral illness   Rapid strep test today is negative, physical exam is unremarkable.  COVID-19 and influenza test results are pending.  We will treat patient based on results of those test.  Otherwise, conservative care is recommended.  ED Prescriptions   None    PDMP not reviewed this encounter.  Disposition Upon Discharge:  Condition: stable for discharge home Home: take medications as prescribed; routine discharge instructions as discussed; follow up as advised.  Patient presented with an acute illness with associated systemic symptoms and significant discomfort requiring urgent management. In my opinion, this is a condition that a prudent lay person (someone who possesses an average knowledge of health and medicine) may potentially expect to result in complications if not addressed urgently such as respiratory distress, impairment of bodily function or dysfunction of bodily organs.   Routine symptom specific, illness specific and/or disease specific instructions were discussed with the patient and/or caregiver at length.   As such, the patient has been evaluated and assessed, work-up was performed and treatment  was provided in alignment with urgent care protocols and evidence based medicine.  Patient/parent/caregiver has been advised that the patient may require follow up for further testing and treatment if the symptoms continue in spite of treatment, as clinically indicated and appropriate.  If the patient was tested for COVID-19, Influenza and/or RSV, then the patient/parent/guardian was advised to isolate at home pending the results of his/her diagnostic coronavirus test and potentially longer if they're positive. I have also advised pt that if his/her COVID-19 test returns positive, it's recommended to self-isolate for at least 10 days after symptoms first appeared AND until fever-free for 24 hours without fever reducer AND other symptoms have improved or resolved. Discussed self-isolation recommendations as well as instructions for household member/close contacts as per the [facility] and [place] DHHS, and also gave patient the [facility] packet with this information.  Patient/parent/caregiver has been advised to return to the [facility] or PCP in 3-5 days if no better; to PCP or the Emergency Department if new signs and symptoms develop, or if the current signs or symptoms continue to change or worsen for further workup, evaluation and treatment as clinically indicated and appropriate  The patient will follow up with their current PCP if and as advised. If the patient does not currently have a PCP we will assist them in obtaining one.   The patient may need specialty follow up if the symptoms continue, in spite of conservative treatment and  management, for further workup, evaluation, consultation and treatment as clinically indicated and appropriate.  Patient/parent/caregiver verbalized understanding and agreement of plan as discussed.  All questions were addressed during visit.  Please see discharge instructions below for further details of plan.  Discharge Instructions:   Discharge Instructions      Your symptoms and  physical exam findings are concerning for a viral respiratory infection.     The result of your COVID-19 and influenza PCR testing will be posted to your MyChart once it is complete, typically this takes 6 to 12 hours.  If there is a positive result, recommend that you take the appropriate antiviral treatment.  They are both recommended in patients who are pregnant.   Your strep test today is negative.  Streptococcal throat culture will be performed per our protocol.  The result of your throat culture will be posted to your MyChart once it is complete, this typically takes 3 to 5 days.  If your streptococcal throat culture is positive, you will be contacted by phone and antibiotics will be prescribed for you.     I have enclosed a list of medications that are safe to take during pregnancy that has been compiled by the OB/GYN group here at [facility].    Please follow-up within the next 5-7 days if your symptoms do not resolve or worsen.     Thank you for visiting urgent care today.  We appreciate the opportunity to participate in your care.       This office note has been dictated using Teaching laboratory technician.  Unfortunately, this method of dictation can sometimes lead to typographical or grammatical errors.  I apologize for your inconvenience in advance if this occurs.  Please do not hesitate to reach out to me if clarification is needed.      Theadora Rama Scales, [place] 02/09/22 [phone]

## 2022-02-12 LAB — CULTURE, GROUP A STREP (THRC)

## 2022-02-13 ENCOUNTER — Inpatient Hospital Stay (HOSPITAL_COMMUNITY)
Admission: AD | Admit: 2022-02-13 | Discharge: 2022-02-13 | Disposition: A | Discharge status: Home / Self Care | Payer: Medicaid Other | Attending: Obstetrics & Gynecology | Admitting: Obstetrics & Gynecology

## 2022-02-13 DIAGNOSIS — O26899 Other specified pregnancy related conditions, unspecified trimester: Secondary | ICD-10-CM | POA: Diagnosis not present

## 2022-02-13 DIAGNOSIS — O219 Vomiting of pregnancy, unspecified: Secondary | ICD-10-CM | POA: Insufficient documentation

## 2022-02-13 DIAGNOSIS — Z8616 Personal history of COVID-19: Secondary | ICD-10-CM | POA: Diagnosis not present

## 2022-02-13 DIAGNOSIS — Z3A01 Less than 8 weeks gestation of pregnancy: Secondary | ICD-10-CM | POA: Diagnosis not present

## 2022-02-13 DIAGNOSIS — N898 Other specified noninflammatory disorders of vagina: Secondary | ICD-10-CM

## 2022-02-13 DIAGNOSIS — O26891 Other specified pregnancy related conditions, first trimester: Secondary | ICD-10-CM | POA: Insufficient documentation

## 2022-02-13 DIAGNOSIS — R109 Unspecified abdominal pain: Secondary | ICD-10-CM | POA: Insufficient documentation

## 2022-02-13 LAB — URINALYSIS, ROUTINE W REFLEX MICROSCOPIC
Bilirubin Urine: NEGATIVE
Glucose, UA: NEGATIVE mg/dL
Hgb urine dipstick: NEGATIVE
Ketones, ur: NEGATIVE mg/dL
Leukocytes,Ua: NEGATIVE
Nitrite: NEGATIVE
Protein, ur: NEGATIVE mg/dL
Specific Gravity, Urine: 1.024 (ref 1.005–1.030)
pH: 6 (ref 5.0–8.0)

## 2022-02-13 LAB — WET PREP, GENITAL
Clue Cells Wet Prep HPF POC: NONE SEEN
Sperm: NONE SEEN
Trich, Wet Prep: NONE SEEN
WBC, Wet Prep HPF POC: <10 (ref <10)
Yeast Wet Prep HPF POC: NONE SEEN

## 2022-02-13 MED ORDER — DOXYLAMINE-PYRIDOXINE 10-10 MG PO TBEC: 1.0000 | DELAYED_RELEASE_TABLET | Freq: Two times a day (BID) | ORAL | 11 refills | Status: DC

## 2022-02-13 NOTE — MAU Provider Note (Signed)
History     [id number]  Arrival date and time: 02/13/22 0865    Chief Complaint  Patient presents with   Abdominal Pain   Vaginal Discharge   Vaginal Itching     HPI Alexis [name] is a 21 y.o. at [redacted]w[redacted]d by early [place], who presents for vaginal irritation and abdominal cramping.   Patient seen in MAU on 02/05/2022, had [place] that showed viable IUP  Recently she used a boric acid based feminine wash and subsequently had a significant amount of irritation She then thought she had a yeast infection so used OTC monistat last night After that she had some significant cramping in lower abdomen and low back, also had one episode of nausea and emesis last night but none since then Currently does not have any pain Has not had any vaginal bleeding Not taking anything for nausea Wants to make sure taking the monistat did not hurt the baby  --/--/O POS (08/23 1209)  OB History     Gravida  2   Para  1   Term  0   Preterm  1   AB  0   Living  2      SAB  0   IAB  0   Ectopic  0   Multiple  1   Live Births  2           Past Medical History:  Diagnosis Date   Anxiety    COVID-19    Depression    GERD (gastroesophageal reflux disease)    Tachycardia    no medications-intermittent    Past Surgical History:  Procedure Laterality Date   NO PAST SURGERIES      Family History  Problem Relation Age of Onset   Diabetes Mother    Diabetes Mellitus II Mother    Hypertension Mother    Arthritis Father    Hypertension Father    Stomach cancer Maternal Aunt    Stomach cancer Paternal Grandmother     Social History   Socioeconomic History   Marital status: Single    Spouse name: Not on file   Number of children: Not on file   Years of education: Not on file   Highest education level: Not on file  Occupational History   Not on file  Tobacco Use   Smoking status: Never   Smokeless tobacco: Never  Vaping Use   Vaping Use: Never used  Substance  and Sexual Activity   Alcohol use: Not Currently   Drug use: Not Currently   Sexual activity: Yes    Birth control/protection: None  Other Topics Concern   Not on file  Social History Narrative   Not on file   Social Determinants of Health   Financial Resource Strain: Not on file  Food Insecurity: Not on file  Transportation Needs: Not on file  Physical Activity: Not on file  Stress: Not on file  Social Connections: Not on file  Intimate Partner Violence: Not on file    Allergies  Allergen Reactions   Dust Mite Extract Hives   Mixed Ragweed Hives   Venofer [Iron Sucrose] Other (See Comments)    Chest pain    No current facility-administered medications on file prior to encounter.   Current Outpatient Medications on File Prior to Encounter  Medication Sig Dispense Refill   albuterol (VENTOLIN HFA) 108 (90 Base) MCG/ACT inhaler Inhale 1-2 puffs into the lungs every 6 (six) hours as needed for wheezing or shortness of  breath. 1 each 0   cetirizine (ZYRTEC) 10 MG tablet Take 10 mg by mouth daily.       ROS Pertinent positives and negative per HPI, all others reviewed and negative  Physical Exam   BP 126/66 (BP Location: Right Arm)   Pulse 99   Temp 98.3 F (36.8 C) (Oral)   Resp 16   Ht 5\' 5"  (1.651 m)   Wt 72.8 kg   LMP 12/30/2021   SpO2 99%   BMI 26.73 kg/m   Patient Vitals for the past 24 hrs:  BP Temp Temp src Pulse Resp SpO2 Height Weight  02/13/22 0958 126/66 98.3 F (36.8 C) Oral 99 16 99 % 5\' 5"  (1.651 m) 72.8 kg    Physical Exam Vitals reviewed.  Constitutional:      General: She is not in acute distress.    Appearance: She is well-developed. She is not diaphoretic.  Eyes:     General: No scleral icterus. Pulmonary:     Effort: Pulmonary effort is normal. No respiratory distress.  Skin:    General: Skin is warm and dry.  Neurological:     Mental Status: She is alert.     Coordination: Coordination normal.      Cervical Exam     Bedside Ultrasound Not done  My interpretation: n/a  FHT Not obtained per unit protocol  Labs Results for orders placed or performed during the hospital encounter of 02/13/22 (from the past 24 hour(s))  Urinalysis, Routine w reflex microscopic Urine, Clean Catch     Status: None   Collection Time: 02/13/22 10:05 AM  Result Value Ref Range   Color, Urine YELLOW YELLOW   APPearance CLEAR CLEAR   Specific Gravity, Urine 1.024 1.005 - 1.030   pH 6.0 5.0 - 8.0   Glucose, UA NEGATIVE NEGATIVE mg/dL   Hgb urine dipstick NEGATIVE NEGATIVE   Bilirubin Urine NEGATIVE NEGATIVE   Ketones, ur NEGATIVE NEGATIVE mg/dL   Protein, ur NEGATIVE NEGATIVE mg/dL   Nitrite NEGATIVE NEGATIVE   Leukocytes,Ua NEGATIVE NEGATIVE  Wet prep, genital     Status: None   Collection Time: 02/13/22 10:07 AM   Specimen: Vaginal  Result Value Ref Range   Yeast Wet Prep HPF POC NONE SEEN NONE SEEN   Trich, Wet Prep NONE SEEN NONE SEEN   Clue Cells Wet Prep HPF POC NONE SEEN NONE SEEN   WBC, Wet Prep HPF POC <10 <10   Sperm NONE SEEN     Imaging No results found.  MAU Course  Procedures Lab Orders         Wet prep, genital         Urinalysis, Routine w reflex microscopic Urine, Clean Catch     Meds ordered this encounter  Medications   Doxylamine-Pyridoxine (DICLEGIS) 10-10 MG TBEC    Sig: Take 1 tablet by mouth in the morning and at bedtime.    Dispense:  60 tablet    Refill:  11   Imaging Orders  No imaging studies ordered today    MDM moderate  Assessment and Plan  #Vaginal irritation #Abdominal cramping in pregnancy, first trimester #Nausea and vomiting in pregnancy, first trimester #[redacted] weeks gestation of pregnancy Irritation secondary to use of boric acid containing product. Advised patient to avoid using this and counseled on routine hygiene measures. Irritation should resolve with time, no further meds needed though could use OTC hydrocortisone PRN if not improving. Reassured her  that cramping is common in first trimester,  no concern on my part that monistat has affected baby but no interventions even if it had. Will send diclegis to pharmacy per her request.     Dispo: discharged to home in stable condition.   Venora Maples, MD/MPH 02/13/22 11:04 AM  Allergies as of 02/13/2022       Reactions   Dust Mite Extract Hives   Mixed Ragweed Hives   Venofer [iron Sucrose] Other (See Comments)   Chest pain        Medication List     TAKE these medications    albuterol 108 (90 Base) MCG/ACT inhaler Commonly known as: VENTOLIN HFA Inhale 1-2 puffs into the lungs every 6 (six) hours as needed for wheezing or shortness of breath.   cetirizine 10 MG tablet Commonly known as: ZYRTEC Take 10 mg by mouth daily.   Doxylamine-Pyridoxine 10-10 MG Tbec Commonly known as: Diclegis Take 1 tablet by mouth in the morning and at bedtime.

## 2022-02-13 NOTE — MAU Note (Signed)
Alexis [name] is a 21 y.o. at [redacted]w[redacted]d here in MAU reporting: started Tue night.  Used a new feminine wash, got irritated. Now feeling irritated and itching, white d/c noted the next day.  Went and got OTC monistat, last night started having cramping in lower abd. Threw up a little night. Cramping not as bad now, off and on, has eased up. Only threw up the one time.  No bleeding, thick white d/c. Still irritated and itching.  Onset of complaint: yesterday Pain score: 5 Vitals:   02/13/22 0958  BP: 126/66  Pulse: 99  Resp: 16  Temp: 98.3 F (36.8 C)  SpO2: 99%      Lab orders placed from triage:

## 2022-02-14 LAB — GC/CHLAMYDIA PROBE AMP (~~LOC~~) NOT AT ARMC
Chlamydia: NEGATIVE
Comment: NEGATIVE
Comment: NORMAL
Neisseria Gonorrhea: NEGATIVE

## 2022-02-27 ENCOUNTER — Inpatient Hospital Stay (HOSPITAL_COMMUNITY)
Admission: AD | Admit: 2022-02-27 | Discharge: 2022-02-27 | Disposition: A | Discharge status: Home / Self Care | Payer: Medicaid Other | Attending: Obstetrics & Gynecology | Admitting: Obstetrics & Gynecology

## 2022-02-27 ENCOUNTER — Encounter (HOSPITAL_COMMUNITY): Payer: Self-pay | Admitting: Obstetrics & Gynecology

## 2022-02-27 DIAGNOSIS — R102 Pelvic and perineal pain: Secondary | ICD-10-CM | POA: Diagnosis not present

## 2022-02-27 DIAGNOSIS — O209 Hemorrhage in early pregnancy, unspecified: Secondary | ICD-10-CM | POA: Diagnosis present

## 2022-02-27 DIAGNOSIS — Z3A08 8 weeks gestation of pregnancy: Secondary | ICD-10-CM | POA: Diagnosis not present

## 2022-02-27 DIAGNOSIS — K5900 Constipation, unspecified: Secondary | ICD-10-CM | POA: Insufficient documentation

## 2022-02-27 DIAGNOSIS — O26891 Other specified pregnancy related conditions, first trimester: Secondary | ICD-10-CM | POA: Insufficient documentation

## 2022-02-27 DIAGNOSIS — R109 Unspecified abdominal pain: Secondary | ICD-10-CM | POA: Diagnosis not present

## 2022-02-27 DIAGNOSIS — O99611 Diseases of the digestive system complicating pregnancy, first trimester: Secondary | ICD-10-CM | POA: Diagnosis not present

## 2022-02-27 LAB — WET PREP, GENITAL
Clue Cells Wet Prep HPF POC: NONE SEEN
Sperm: NONE SEEN
Trich, Wet Prep: NONE SEEN
WBC, Wet Prep HPF POC: <10 (ref <10)
Yeast Wet Prep HPF POC: NONE SEEN

## 2022-02-27 LAB — URINALYSIS, ROUTINE W REFLEX MICROSCOPIC
Bilirubin Urine: NEGATIVE
Glucose, UA: NEGATIVE mg/dL
Hgb urine dipstick: NEGATIVE
Ketones, ur: NEGATIVE mg/dL
Leukocytes,Ua: NEGATIVE
Nitrite: NEGATIVE
Protein, ur: NEGATIVE mg/dL
Specific Gravity, Urine: 1.026 (ref 1.005–1.030)
pH: 6 (ref 5.0–8.0)

## 2022-02-27 NOTE — MAU Note (Signed)
.  Alexis [name] is a 21 y.o. at [redacted]w[redacted]d here in MAU reporting: she started having some cramping yesterday. Woke up this morning and had brown spotting . Last intercourse 2 days ago.  LMP:  Onset of complaint: yesterday Pain score: 5 Vitals:   02/27/22 1129  BP: 130/81  Pulse: 93  Resp: 18  Temp: 98.1 F (36.7 C)     FHT:n/a Lab orders placed from triage:  u/a, Wet prep, GC

## 2022-02-27 NOTE — MAU Provider Note (Signed)
History     [id number]  Arrival date and time: 02/27/22 1107    Chief Complaint  Patient presents with   Vaginal Bleeding     HPI Alexis [name] is a 21 y.o. at [redacted]w[redacted]d by LMP, who presents for vaginal bleeding and pelvic pain.   Has had cramping since yesterday morning that has been constant Tylenol not effective for pain Radiating to her back No burning or pain with urination Some white vaginal discharge but no odor, itching, or pain Endorses some mild constipation Some n/v similar to her last pregnancy No fevers Last intercourse two days ago Also had spotting this morning after using the bathroom Primarily brown streaks like old period bleed Not bleeding currently   --/--/O POS (08/23 1209)  OB History     Gravida  2   Para  1   Term  0   Preterm  1   AB  0   Living  2      SAB  0   IAB  0   Ectopic  0   Multiple  1   Live Births  2           Past Medical History:  Diagnosis Date   Anxiety    COVID-19    Depression    GERD (gastroesophageal reflux disease)    Tachycardia    no medications-intermittent    Past Surgical History:  Procedure Laterality Date   NO PAST SURGERIES      Family History  Problem Relation Age of Onset   Diabetes Mother    Diabetes Mellitus II Mother    Hypertension Mother    Arthritis Father    Hypertension Father    Stomach cancer Maternal Aunt    Stomach cancer Paternal Grandmother     Social History   Socioeconomic History   Marital status: Single    Spouse name: Not on file   Number of children: Not on file   Years of education: Not on file   Highest education level: Not on file  Occupational History   Not on file  Tobacco Use   Smoking status: Never   Smokeless tobacco: Never  Vaping Use   Vaping Use: Never used  Substance and Sexual Activity   Alcohol use: Not Currently   Drug use: Not Currently   Sexual activity: Yes    Birth control/protection: None  Other Topics  Concern   Not on file  Social History Narrative   Not on file   Social Determinants of Health   Financial Resource Strain: Not on file  Food Insecurity: Not on file  Transportation Needs: Not on file  Physical Activity: Not on file  Stress: Not on file  Social Connections: Not on file  Intimate Partner Violence: Not on file    Allergies  Allergen Reactions   Dust Mite Extract Hives   Mixed Ragweed Hives   Venofer [Iron Sucrose] Other (See Comments)    Chest pain    No current facility-administered medications on file prior to encounter.   Current Outpatient Medications on File Prior to Encounter  Medication Sig Dispense Refill   cetirizine (ZYRTEC) 10 MG tablet Take 10 mg by mouth daily.     Doxylamine-Pyridoxine (DICLEGIS) 10-10 MG TBEC Take 1 tablet by mouth in the morning and at bedtime. 60 tablet 11   Prenatal Vit-Fe Fumarate-FA (PRENATAL MULTIVITAMIN) TABS tablet Take 1 tablet by mouth daily at 12 noon.     albuterol (VENTOLIN HFA) 108 (90  Base) MCG/ACT inhaler Inhale 1-2 puffs into the lungs every 6 (six) hours as needed for wheezing or shortness of breath. 1 each 0     ROS Pertinent positives and negative per HPI, all others reviewed and negative  Physical Exam   BP 130/81   Pulse 93   Temp 98.1 F (36.7 C)   Resp 18   Ht 5\' 5"  (1.651 m)   Wt 73.5 kg   LMP 12/30/2021   BMI 26.96 kg/m   Patient Vitals for the past 24 hrs:  BP Temp Pulse Resp Height Weight  02/27/22 1129 130/81 98.1 F (36.7 C) 93 18 5\' 5"  (1.651 m) 73.5 kg    Physical Exam Vitals reviewed.  Constitutional:      General: She is not in acute distress.    Appearance: She is well-developed. She is not diaphoretic.  Eyes:     General: No scleral icterus. Pulmonary:     Effort: Pulmonary effort is normal. No respiratory distress.  Abdominal:     General: There is no distension.     Palpations: Abdomen is soft.     Tenderness: There is no abdominal tenderness. There is no guarding  or rebound.  Skin:    General: Skin is warm and dry.  Neurological:     Mental Status: She is alert.     Coordination: Coordination normal.      Cervical Exam    Bedside Ultrasound Pt informed that the ultrasound is considered a limited OB ultrasound and is not intended to be a complete ultrasound exam.  Patient also informed that the ultrasound is not being completed with the intent of assessing for fetal or placental anomalies or any pelvic abnormalities.  Explained that the purpose of today's ultrasound is to assess for  viability.  Patient acknowledges the purpose of the exam and the limitations of the study.     My interpretation: viable IUP seen, FHR 169 bpm    Labs Results for orders placed or performed during the hospital encounter of 02/27/22 (from the past 24 hour(s))  Urinalysis, Routine w reflex microscopic Urine, Clean Catch     Status: Abnormal   Collection Time: 02/27/22 11:42 AM  Result Value Ref Range   Color, Urine YELLOW YELLOW   APPearance HAZY (A) CLEAR   Specific Gravity, Urine 1.026 1.005 - 1.030   pH 6.0 5.0 - 8.0   Glucose, UA NEGATIVE NEGATIVE mg/dL   Hgb urine dipstick NEGATIVE NEGATIVE   Bilirubin Urine NEGATIVE NEGATIVE   Ketones, ur NEGATIVE NEGATIVE mg/dL   Protein, ur NEGATIVE NEGATIVE mg/dL   Nitrite NEGATIVE NEGATIVE   Leukocytes,Ua NEGATIVE NEGATIVE  Wet prep, genital     Status: None   Collection Time: 02/27/22 11:42 AM   Specimen: PATH Cytology Cervicovaginal Ancillary Only  Result Value Ref Range   Yeast Wet Prep HPF POC NONE SEEN NONE SEEN   Trich, Wet Prep NONE SEEN NONE SEEN   Clue Cells Wet Prep HPF POC NONE SEEN NONE SEEN   WBC, Wet Prep HPF POC <10 <10   Sperm NONE SEEN     Imaging No results found.  MAU Course  Procedures Lab Orders         Wet prep, genital         Urinalysis, Routine w reflex microscopic Urine, Clean Catch    No orders of the defined types were placed in this encounter.  Imaging Orders  No  imaging studies ordered today    MDM moderate  Assessment and Plan  #Vaginal bleeding in pregnancy, first trimester #Abdominal pain in pregnancy, first trimester #[redacted] weeks gestation of pregnancy US shows viable IUP. We discussed that vaginal bleeding in the first trimester is common, and that 80-90% of patients will go on to have a normal pregnancy with a live delivery. The remainder are at increased risk for miscarriage, unfortunately there are no known interventions to mitigate this risk. --/--/O POS (08/23 1209), rhogam not indicated. We discussed return precautions including crescendo abdominal pain, heavy vaginal bleeding soaking >1 pad/hour, and fever.   #FWB Normal fetal cardiac activity seen on bedside ultrasound   Dispo: discharged to home in stable condition.   Venora Maples, MD/MPH 02/27/22 2:56 PM  Allergies as of 02/27/2022       Reactions   Dust Mite Extract Hives   Mixed Ragweed Hives   Venofer [iron Sucrose] Other (See Comments)   Chest pain        Medication List     TAKE these medications    albuterol 108 (90 Base) MCG/ACT inhaler Commonly known as: VENTOLIN HFA Inhale 1-2 puffs into the lungs every 6 (six) hours as needed for wheezing or shortness of breath.   cetirizine 10 MG tablet Commonly known as: ZYRTEC Take 10 mg by mouth daily.   Doxylamine-Pyridoxine 10-10 MG Tbec Commonly known as: Diclegis Take 1 tablet by mouth in the morning and at bedtime.   prenatal multivitamin Tabs tablet Take 1 tablet by mouth daily at 12 noon.

## 2022-02-28 LAB — GC/CHLAMYDIA PROBE AMP (~~LOC~~) NOT AT ARMC
Chlamydia: NEGATIVE
Comment: NEGATIVE
Comment: NORMAL
Neisseria Gonorrhea: NEGATIVE

## 2022-03-03 ENCOUNTER — Ambulatory Visit: Payer: Medicaid Other

## 2022-03-11 ENCOUNTER — Other Ambulatory Visit: Payer: Medicaid Other

## 2022-03-11 ENCOUNTER — Other Ambulatory Visit: Payer: Self-pay

## 2022-03-11 DIAGNOSIS — Z369 Encounter for antenatal screening, unspecified: Secondary | ICD-10-CM

## 2022-03-11 DIAGNOSIS — Z3491 Encounter for supervision of normal pregnancy, unspecified, first trimester: Secondary | ICD-10-CM

## 2022-03-21 ENCOUNTER — Encounter: Payer: Medicaid Other | Admitting: Family Medicine

## 2022-05-12 ENCOUNTER — Other Ambulatory Visit: Payer: Self-pay | Admitting: Obstetrics and Gynecology

## 2022-05-12 ENCOUNTER — Other Ambulatory Visit: Payer: Self-pay

## 2022-05-12 DIAGNOSIS — Z363 Encounter for antenatal screening for malformations: Secondary | ICD-10-CM

## 2022-05-16 ENCOUNTER — Other Ambulatory Visit: Payer: Medicaid Other

## 2022-05-16 ENCOUNTER — Ambulatory Visit: Payer: Medicaid Other | Attending: Obstetrics and Gynecology

## 2022-06-12 ENCOUNTER — Other Ambulatory Visit: Payer: Self-pay | Admitting: Family Medicine

## 2022-06-12 DIAGNOSIS — B379 Candidiasis, unspecified: Secondary | ICD-10-CM

## 2022-06-12 DIAGNOSIS — N898 Other specified noninflammatory disorders of vagina: Secondary | ICD-10-CM

## 2022-06-13 ENCOUNTER — Other Ambulatory Visit: Payer: Self-pay | Admitting: Family Medicine

## 2022-06-13 DIAGNOSIS — N898 Other specified noninflammatory disorders of vagina: Secondary | ICD-10-CM

## 2022-06-13 DIAGNOSIS — B379 Candidiasis, unspecified: Secondary | ICD-10-CM

## 2022-06-16 NOTE — L&D Delivery Note (Addendum)
Delivery Note At 12:02 PM a viable female was delivered via Vaginal, Spontaneous (Presentation: ROA).  APGAR: 3, 8; weight 7 lbs 2.3 oz .   Placenta status: Spontaneous, Intact.  Cord: 3 vessels with the following complications: About 4 cm retroplacental clot noted at the edge.  Cord pH: none collected.  No nuchal cord.  Shoulders delivered without difficulty, Left shoulder was anterior.  Upon delivery, baby was placed on mother's abdomen where it was dried and stimulated.  Baby noted to have poor tone therefore cord was clamped and cut within a few seconds of delivery and baby was transferred to the baby warmer.  Code apgars was called and pediatric team arrived and took over care of the baby.  Baby remained in the room entire time and was stable.  Soon baby had spontaneous cry and had good tone.  Pitocin was started at this point.  Placenta was delivered with gentle traction on cord and abdominal counter traction.  No lacerations were noted.   Anesthesia: Epidural Episiotomy: None Lacerations: None Suture Repair:  N/A Est. Blood Loss (mL): 78 Mom to postpartum.  Baby to Couplet care / Skin to Skin. Prescilla Sours, MD.  10/06/2022, 12:57 PM

## 2022-06-23 MED ORDER — FLUCONAZOLE 150 MG PO TABS: 150.0000 mg | ORAL_TABLET | Freq: Once | ORAL | 1 refills | Status: AC

## 2022-06-23 NOTE — Addendum Note (Signed)
Addended by: Truett Mainland on: 06/23/2022 08:27 AM   Modules accepted: Orders

## 2022-09-15 ENCOUNTER — Encounter (HOSPITAL_COMMUNITY): Payer: Self-pay | Admitting: Obstetrics and Gynecology

## 2022-09-15 ENCOUNTER — Inpatient Hospital Stay (HOSPITAL_COMMUNITY)
Admission: AD | Admit: 2022-09-15 | Discharge: 2022-09-15 | Disposition: A | Discharge status: Home / Self Care | Payer: Medicaid Other | Attending: Obstetrics and Gynecology | Admitting: Obstetrics and Gynecology

## 2022-09-15 DIAGNOSIS — R079 Chest pain, unspecified: Secondary | ICD-10-CM

## 2022-09-15 DIAGNOSIS — R519 Headache, unspecified: Secondary | ICD-10-CM | POA: Insufficient documentation

## 2022-09-15 DIAGNOSIS — O26893 Other specified pregnancy related conditions, third trimester: Secondary | ICD-10-CM | POA: Diagnosis not present

## 2022-09-15 DIAGNOSIS — O09293 Supervision of pregnancy with other poor reproductive or obstetric history, third trimester: Secondary | ICD-10-CM | POA: Diagnosis not present

## 2022-09-15 DIAGNOSIS — K219 Gastro-esophageal reflux disease without esophagitis: Secondary | ICD-10-CM

## 2022-09-15 DIAGNOSIS — Z3A37 37 weeks gestation of pregnancy: Secondary | ICD-10-CM

## 2022-09-15 LAB — URINALYSIS, ROUTINE W REFLEX MICROSCOPIC
Bilirubin Urine: NEGATIVE
Glucose, UA: NEGATIVE mg/dL
Hgb urine dipstick: NEGATIVE
Ketones, ur: NEGATIVE mg/dL
Leukocytes,Ua: NEGATIVE
Nitrite: NEGATIVE
Protein, ur: NEGATIVE mg/dL
Specific Gravity, Urine: 1.014 (ref 1.005–1.030)
pH: 6 (ref 5.0–8.0)

## 2022-09-15 LAB — CBC WITH DIFFERENTIAL/PLATELET
Abs Immature Granulocytes: 0.08 10*3/uL — ABNORMAL HIGH (ref 0.00–0.07)
Basophils Absolute: 0 10*3/uL (ref 0.0–0.1)
Basophils Relative: 0 %
Eosinophils Absolute: 0.1 10*3/uL (ref 0.0–0.5)
Eosinophils Relative: 2 %
HCT: 30.5 % — ABNORMAL LOW (ref 36.0–46.0)
Hemoglobin: 10.2 g/dL — ABNORMAL LOW (ref 12.0–15.0)
Immature Granulocytes: 1 %
Lymphocytes Relative: 16 %
Lymphs Abs: 1.1 10*3/uL (ref 0.7–4.0)
MCH: 31.8 pg (ref 26.0–34.0)
MCHC: 33.4 g/dL (ref 30.0–36.0)
MCV: 95 fL (ref 80.0–100.0)
Monocytes Absolute: 0.6 10*3/uL (ref 0.1–1.0)
Monocytes Relative: 10 %
Neutro Abs: 4.6 10*3/uL (ref 1.7–7.7)
Neutrophils Relative %: 71 %
Platelets: 249 10*3/uL (ref 150–400)
RBC: 3.21 MIL/uL — ABNORMAL LOW (ref 3.87–5.11)
RDW: 13.3 % (ref 11.5–15.5)
WBC: 6.5 10*3/uL (ref 4.0–10.5)
nRBC: 0 % (ref 0.0–0.2)

## 2022-09-15 LAB — COMPREHENSIVE METABOLIC PANEL
ALT: 13 U/L (ref 0–44)
AST: 18 U/L (ref 15–41)
Albumin: 2.8 g/dL — ABNORMAL LOW (ref 3.5–5.0)
Alkaline Phosphatase: 88 U/L (ref 38–126)
Anion gap: 7 (ref 5–15)
BUN: 5 mg/dL — ABNORMAL LOW (ref 6–20)
CO2: 20 mmol/L — ABNORMAL LOW (ref 22–32)
Calcium: 8.4 mg/dL — ABNORMAL LOW (ref 8.9–10.3)
Chloride: 107 mmol/L (ref 98–111)
Creatinine, Ser: 0.56 mg/dL (ref 0.44–1.00)
GFR, Estimated: >60 mL/min (ref >60)
Glucose, Bld: 91 mg/dL (ref 70–99)
Potassium: 3.6 mmol/L (ref 3.5–5.1)
Sodium: 134 mmol/L — ABNORMAL LOW (ref 135–145)
Total Bilirubin: 0.3 mg/dL (ref 0.3–1.2)
Total Protein: 6.1 g/dL — ABNORMAL LOW (ref 6.5–8.1)

## 2022-09-15 LAB — PROTEIN / CREATININE RATIO, URINE
Creatinine, Urine: 99 mg/dL
Protein Creatinine Ratio: 0.07 mg/mg{Cre} (ref 0.00–0.15)
Total Protein, Urine: 7 mg/dL

## 2022-09-15 MED ORDER — LIDOCAINE VISCOUS HCL 2 % MT SOLN
15.0000 mL | Freq: Once | OROMUCOSAL | Status: AC
  Administered 2022-09-15: 15 mL via OROMUCOSAL
  Filled 2022-09-15: qty 15

## 2022-09-15 MED ORDER — ALUM & MAG HYDROXIDE-SIMETH 200-200-20 MG/5ML PO SUSP
30.0000 mL | Freq: Once | ORAL | Status: AC
  Administered 2022-09-15: 30 mL via ORAL
  Filled 2022-09-15: qty 30

## 2022-09-15 MED ORDER — LACTATED RINGERS IV BOLUS
1000.0000 mL | Freq: Once | INTRAVENOUS | Status: AC
  Administered 2022-09-15: 1000 mL via INTRAVENOUS

## 2022-09-15 MED ORDER — FAMOTIDINE 20 MG PO TABS
40.0000 mg | ORAL_TABLET | Freq: Once | ORAL | Status: AC
  Administered 2022-09-15: 40 mg via ORAL
  Filled 2022-09-15: qty 2

## 2022-09-15 MED ORDER — OXYCODONE HCL 5 MG PO TABS
5.0000 mg | ORAL_TABLET | Freq: Once | ORAL | Status: AC
  Administered 2022-09-15: 5 mg via ORAL
  Filled 2022-09-15: qty 1

## 2022-09-15 MED ORDER — CYCLOBENZAPRINE HCL 5 MG PO TABS
10.0000 mg | ORAL_TABLET | Freq: Once | ORAL | Status: AC
  Administered 2022-09-15: 10 mg via ORAL
  Filled 2022-09-15: qty 2

## 2022-09-15 MED ORDER — ACETAMINOPHEN-CAFFEINE 500-65 MG PO TABS
2.0000 | ORAL_TABLET | Freq: Once | ORAL | Status: AC
  Administered 2022-09-15: 2 via ORAL
  Filled 2022-09-15: qty 2

## 2022-09-15 NOTE — MAU Note (Signed)
Alexis [name] is a 22 y.o. at [redacted]w[redacted]d here in MAU reporting: has had a throbbing HA since Saturday, has taken ex Tylenol 1000mg  every 4-6, last dose was 2100 3/31, not really helping.  Denies spots or blurring. Having sharp stabbing pains below sternum, started Thur evening. Had been at drs Thu a.m., BP was a little bit high then, had 3+ protein.  Hx of PRe-E, so thought she should come get checked.  Denies bleeding or LOF, reports +FM Onset of complaint: Thurs night Pain score: HA 8, epig 8 Vitals:   09/15/22 1142  BP: 133/75  Pulse: (!) 101  Resp: 18  Temp: 99 F (37.2 C)  SpO2: 99%     FHT:134 Lab orders placed from triage:  urine collected

## 2022-09-15 NOTE — MAU Provider Note (Signed)
MAU Provider Note  History  [id number]  Arrival date and time: 09/15/22 1127   Chief Complaint  Patient presents with   Headache   Abdominal Pain     HPI Alexis [name] is a 22 y.o. G2P0102 at [redacted]w[redacted]d by LMP with PMHx notable for History of preeclampsia, who presents for intractable headache, chest pain for the last 1 to 2 days.  Patient notes headaches throbbing behind her eyes and pain around her head like a band. She has mad migraines before, but not like this HA. She tried tylenol yesterday and it was not helpful. She did not take any meds today. She notes chest pain, located at the junction of her abdomen and her chest. She initially thought this was heartburn, but this pain has been worsening in the last couple days as well. She has been seeing spots in her vision, but no blurry vision.   Vaginal bleeding: No LOF: No Fetal Movement: Yes Contractions: No  --/--/O POS (08/23 1209)  OB History     Gravida  2   Para  1   Term  0   Preterm  1   AB  0   Living  2      SAB  0   IAB  0   Ectopic  0   Multiple  1   Live Births  2           Past Medical History:  Diagnosis Date   Anxiety    COVID-19    Depression    GERD (gastroesophageal reflux disease)    Tachycardia    no medications-intermittent    Past Surgical History:  Procedure Laterality Date   NO PAST SURGERIES      Family History  Problem Relation Age of Onset   Diabetes Mother    Diabetes Mellitus II Mother    Hypertension Mother    Arthritis Father    Hypertension Father    Stomach cancer Maternal Aunt    Stomach cancer Paternal Grandmother     Social History   Socioeconomic History   Marital status: Single    Spouse name: Not on file   Number of children: Not on file   Years of education: Not on file   Highest education level: Not on file  Occupational History   Not on file  Tobacco Use   Smoking status: Never   Smokeless tobacco: Never  Vaping Use   Vaping  Use: Never used  Substance and Sexual Activity   Alcohol use: Not Currently   Drug use: Not Currently   Sexual activity: Yes    Birth control/protection: None  Other Topics Concern   Not on file  Social History Narrative   Not on file   Social Determinants of Health   Financial Resource Strain: Not on file  Food Insecurity: Not on file  Transportation Needs: Not on file  Physical Activity: Not on file  Stress: Not on file  Social Connections: Not on file  Intimate Partner Violence: Not on file    Allergies  Allergen Reactions   Dust Mite Extract Hives   Mixed Ragweed Hives   Venofer [Iron Sucrose] Other (See Comments)    Chest pain    No current facility-administered medications on file prior to encounter.   Current Outpatient Medications on File Prior to Encounter  Medication Sig Dispense Refill   aspirin 81 MG chewable tablet Chew by mouth daily.     Prenatal Vit-Fe Fumarate-FA (PRENATAL MULTIVITAMIN) TABS tablet  Take 1 tablet by mouth daily at 12 noon.     albuterol (VENTOLIN HFA) 108 (90 Base) MCG/ACT inhaler Inhale 1-2 puffs into the lungs every 6 (six) hours as needed for wheezing or shortness of breath. 1 each 0   cetirizine (ZYRTEC) 10 MG tablet Take 10 mg by mouth daily.     Doxylamine-Pyridoxine (DICLEGIS) 10-10 MG TBEC Take 1 tablet by mouth in the morning and at bedtime. 60 tablet 11     Review of Systems  Constitutional:  Negative for appetite change, chills and fever.  HENT:  Negative for congestion, facial swelling and postnasal drip.   Eyes:  Negative for photophobia.  Respiratory:  Negative for cough and shortness of breath.   Cardiovascular:  Negative for chest pain.  Gastrointestinal:  Negative for abdominal pain, nausea and vomiting.  Endocrine: Negative for polyuria.  Genitourinary:  Negative for flank pain and pelvic pain.  Musculoskeletal:  Negative for arthralgias.  Skin:  Negative for rash.  Neurological:  Negative for weakness and  light-headedness.  Psychiatric/Behavioral:  Negative for confusion.     Pertinent positives and negative per HPI, all others reviewed and negative  Physical Exam   BP 127/69   Pulse 90   Temp 99 F (37.2 C) (Oral)   Resp 18   Ht 5\' 5"  (1.651 m)   Wt 89.1 kg   LMP 12/30/2021   SpO2 100%   BMI 32.70 kg/m   Patient Vitals for the past 24 hrs:  BP Temp Temp src Pulse Resp SpO2 Height Weight  09/15/22 1500 127/69 -- -- 90 -- 100 % -- --  09/15/22 1445 122/65 -- -- 84 -- 100 % -- --  09/15/22 1430 133/74 -- -- 92 -- 100 % -- --  09/15/22 1415 127/74 -- -- (!) 118 -- 100 % -- --  09/15/22 1400 135/70 -- -- 93 -- 100 % -- --  09/15/22 1345 138/70 -- -- 99 -- 100 % -- --  09/15/22 1330 129/72 -- -- 88 -- 100 % -- --  09/15/22 1315 127/76 -- -- 95 -- 100 % -- --  09/15/22 1305 (!) 144/84 -- -- (!) 102 -- 100 % -- --  09/15/22 1245 127/70 -- -- 95 -- 100 % -- --  09/15/22 1230 121/67 -- -- 94 -- 100 % -- --  09/15/22 1215 126/72 -- -- 99 -- 100 % -- --  09/15/22 1204 127/72 -- -- 99 -- -- -- --  09/15/22 1142 133/75 99 F (37.2 C) Oral (!) 101 18 99 % 5\' 5"  (1.651 m) 89.1 kg    Physical Exam Vitals reviewed.  Constitutional:      Appearance: Normal appearance.  HENT:     Head: Normocephalic and atraumatic.     Right Ear: External ear normal.     Left Ear: External ear normal.     Nose: Nose normal.     Mouth/Throat:     Pharynx: Oropharynx is clear.  Eyes:     Conjunctiva/sclera: Conjunctivae normal.  Cardiovascular:     Rate and Rhythm: Normal rate and regular rhythm.  Pulmonary:     Effort: Pulmonary effort is normal.  Abdominal:     Palpations: Abdomen is soft.     Tenderness: There is abdominal tenderness in the epigastric area.     Comments: Gravid  Genitourinary:    General: Normal vulva.     Vagina: No vaginal discharge.     Rectum: Normal.     Comments: SVE not  done Skin:    General: Skin is warm.     Capillary Refill: Capillary refill takes less than 2  seconds.  Neurological:     Mental Status: Mental status is at baseline.  Psychiatric:        Mood and Affect: Mood normal.     Cervical Exam  N/a  Bedside Ultrasound not done  FHT Baseline 140bpm, moderate variability, 15x15 accels, no decels Toco: irritability Cat: 1  Labs Results for orders placed or performed during the hospital encounter of 09/15/22 (from the past 24 hour(s))  CBC with Differential/Platelet     Status: Abnormal   Collection Time: 09/15/22 12:43 PM  Result Value Ref Range   WBC 6.5 4.0 - 10.5 K/uL   RBC 3.21 (L) 3.87 - 5.11 MIL/uL   Hemoglobin 10.2 (L) 12.0 - 15.0 g/dL   HCT 30.5 (L) 36.0 - 46.0 %   MCV 95.0 80.0 - 100.0 fL   MCH 31.8 26.0 - 34.0 pg   MCHC 33.4 30.0 - 36.0 g/dL   RDW 13.3 11.5 - 15.5 %   Platelets 249 150 - 400 K/uL   nRBC 0.0 0.0 - 0.2 %   Neutrophils Relative % 71 %   Neutro Abs 4.6 1.7 - 7.7 K/uL   Lymphocytes Relative 16 %   Lymphs Abs 1.1 0.7 - 4.0 K/uL   Monocytes Relative 10 %   Monocytes Absolute 0.6 0.1 - 1.0 K/uL   Eosinophils Relative 2 %   Eosinophils Absolute 0.1 0.0 - 0.5 K/uL   Basophils Relative 0 %   Basophils Absolute 0.0 0.0 - 0.1 K/uL   Immature Granulocytes 1 %   Abs Immature Granulocytes 0.08 (H) 0.00 - 0.07 K/uL  Comprehensive metabolic panel     Status: Abnormal   Collection Time: 09/15/22 12:43 PM  Result Value Ref Range   Sodium 134 (L) 135 - 145 mmol/L   Potassium 3.6 3.5 - 5.1 mmol/L   Chloride 107 98 - 111 mmol/L   CO2 20 (L) 22 - 32 mmol/L   Glucose, Bld 91 70 - 99 mg/dL   BUN 5 (L) 6 - 20 mg/dL   Creatinine, Ser 0.56 0.44 - 1.00 mg/dL   Calcium 8.4 (L) 8.9 - 10.3 mg/dL   Total Protein 6.1 (L) 6.5 - 8.1 g/dL   Albumin 2.8 (L) 3.5 - 5.0 g/dL   AST 18 15 - 41 U/L   ALT 13 0 - 44 U/L   Alkaline Phosphatase 88 38 - 126 U/L   Total Bilirubin 0.3 0.3 - 1.2 mg/dL   GFR, Estimated >60 >60 mL/min   Anion gap 7 5 - 15  Urinalysis, Routine w reflex microscopic -Urine, Clean Catch     Status: None    Collection Time: 09/15/22  1:00 PM  Result Value Ref Range   Color, Urine YELLOW YELLOW   APPearance CLEAR CLEAR   Specific Gravity, Urine 1.014 1.005 - 1.030   pH 6.0 5.0 - 8.0   Glucose, UA NEGATIVE NEGATIVE mg/dL   Hgb urine dipstick NEGATIVE NEGATIVE   Bilirubin Urine NEGATIVE NEGATIVE   Ketones, ur NEGATIVE NEGATIVE mg/dL   Protein, ur NEGATIVE NEGATIVE mg/dL   Nitrite NEGATIVE NEGATIVE   Leukocytes,Ua NEGATIVE NEGATIVE  Protein / creatinine ratio, urine     Status: None   Collection Time: 09/15/22  1:00 PM  Result Value Ref Range   Creatinine, Urine 99 mg/dL   Total Protein, Urine 7 mg/dL   Protein Creatinine Ratio 0.07 0.00 -  0.15 mg/mg[Cre]    Imaging No results found.  MAU Course  Procedures Lab Orders         Urinalysis, Routine w reflex microscopic -Urine, Clean Catch         CBC with Differential/Platelet         Comprehensive metabolic panel         Protein / creatinine ratio, urine    Meds ordered this encounter  Medications   acetaminophen-caffeine (EXCEDRIN TENSION HEADACHE) 500-65 MG per tablet 2 tablet   cyclobenzaprine (FLEXERIL) tablet 10 mg   alum & mag hydroxide-simeth (MAALOX/MYLANTA) 200-200-20 MG/5ML suspension 30 mL   lidocaine (XYLOCAINE) 2 % viscous mouth solution 15 mL   famotidine (PEPCID) tablet 40 mg   lactated ringers bolus 1,000 mL   oxyCODONE (Oxy IR/ROXICODONE) immediate release tablet 5 mg   Imaging Orders  No imaging studies ordered today    MDM moderate  Assessment and Plan  Chest pain Intractable HA H/o Pre-Eclampsia [redacted] weeks gestation Fetal Well being As above Discussed patient with RN. NST reviewed.   Pt seen for PreE w/up given her sx. PE remarkable for epigastric ttp. PreE labs negative. BP well controlled. Gave GI cocktail, Pepcid, Excedrin and flexeril. No relief of sx, so added IVB and roxicodone and food. Pt sx improved.  Gave return and term labor precautions.  Follow-up with primary OB on  thursday.  Dispo: discharged to home in stable condition.   Discharge Instructions     Discharge patient   Complete by: As directed    Discharge disposition: 01-Home or Self Care   Discharge patient date: 09/15/2022      Allergies as of 09/15/2022       Reactions   Dust Mite Extract Hives   Mixed Ragweed Hives   Venofer [iron Sucrose] Other (See Comments)   Chest pain        Medication List     TAKE these medications    albuterol 108 (90 Base) MCG/ACT inhaler Commonly known as: VENTOLIN HFA Inhale 1-2 puffs into the lungs every 6 (six) hours as needed for wheezing or shortness of breath.   aspirin 81 MG chewable tablet Chew by mouth daily.   cetirizine 10 MG tablet Commonly known as: ZYRTEC Take 10 mg by mouth daily.   Doxylamine-Pyridoxine 10-10 MG Tbec Commonly known as: Diclegis Take 1 tablet by mouth in the morning and at bedtime.   prenatal multivitamin Tabs tablet Take 1 tablet by mouth daily at 12 noon.        Shelda Pal, [place] Fellow, Faculty practice [place] for [facility] 09/15/22  4:51 PM

## 2022-09-21 ENCOUNTER — Inpatient Hospital Stay (HOSPITAL_COMMUNITY)
Admission: AD | Admit: 2022-09-21 | Discharge: 2022-09-21 | Disposition: A | Discharge status: Home / Self Care | Payer: Medicaid Other | Attending: Obstetrics and Gynecology | Admitting: Obstetrics and Gynecology

## 2022-09-21 ENCOUNTER — Encounter (HOSPITAL_COMMUNITY): Payer: Self-pay | Admitting: Obstetrics and Gynecology

## 2022-09-21 DIAGNOSIS — R519 Headache, unspecified: Secondary | ICD-10-CM | POA: Insufficient documentation

## 2022-09-21 DIAGNOSIS — Z3A37 37 weeks gestation of pregnancy: Secondary | ICD-10-CM | POA: Insufficient documentation

## 2022-09-21 DIAGNOSIS — O471 False labor at or after 37 completed weeks of gestation: Secondary | ICD-10-CM | POA: Insufficient documentation

## 2022-09-21 DIAGNOSIS — O479 False labor, unspecified: Secondary | ICD-10-CM

## 2022-09-21 DIAGNOSIS — Z8616 Personal history of COVID-19: Secondary | ICD-10-CM | POA: Insufficient documentation

## 2022-09-21 DIAGNOSIS — O26893 Other specified pregnancy related conditions, third trimester: Secondary | ICD-10-CM | POA: Diagnosis not present

## 2022-09-21 LAB — COMPREHENSIVE METABOLIC PANEL
ALT: 14 U/L (ref 0–44)
AST: 15 U/L (ref 15–41)
Albumin: 2.8 g/dL — ABNORMAL LOW (ref 3.5–5.0)
Alkaline Phosphatase: 88 U/L (ref 38–126)
Anion gap: 11 (ref 5–15)
BUN: <5 mg/dL — ABNORMAL LOW (ref 6–20)
CO2: 19 mmol/L — ABNORMAL LOW (ref 22–32)
Calcium: 8.4 mg/dL — ABNORMAL LOW (ref 8.9–10.3)
Chloride: 107 mmol/L (ref 98–111)
Creatinine, Ser: 0.6 mg/dL (ref 0.44–1.00)
GFR, Estimated: >60 mL/min (ref >60)
Glucose, Bld: 81 mg/dL (ref 70–99)
Potassium: 3.8 mmol/L (ref 3.5–5.1)
Sodium: 137 mmol/L (ref 135–145)
Total Bilirubin: 0.2 mg/dL — ABNORMAL LOW (ref 0.3–1.2)
Total Protein: 6.3 g/dL — ABNORMAL LOW (ref 6.5–8.1)

## 2022-09-21 LAB — CBC
HCT: 31.8 % — ABNORMAL LOW (ref 36.0–46.0)
Hemoglobin: 10.2 g/dL — ABNORMAL LOW (ref 12.0–15.0)
MCH: 31 pg (ref 26.0–34.0)
MCHC: 32.1 g/dL (ref 30.0–36.0)
MCV: 96.7 fL (ref 80.0–100.0)
Platelets: 272 10*3/uL (ref 150–400)
RBC: 3.29 MIL/uL — ABNORMAL LOW (ref 3.87–5.11)
RDW: 13.6 % (ref 11.5–15.5)
WBC: 6.9 10*3/uL (ref 4.0–10.5)
nRBC: 0 % (ref 0.0–0.2)

## 2022-09-21 LAB — PROTEIN / CREATININE RATIO, URINE
Creatinine, Urine: 44 mg/dL
Protein Creatinine Ratio: 0.14 mg/mg{Cre} (ref 0.00–0.15)
Total Protein, Urine: 6 mg/dL

## 2022-09-21 MED ORDER — ACETAMINOPHEN-CAFFEINE 500-65 MG PO TABS
2.0000 | ORAL_TABLET | Freq: Once | ORAL | Status: AC
  Administered 2022-09-21: 2 via ORAL
  Filled 2022-09-21: qty 2

## 2022-09-21 NOTE — MAU Note (Addendum)
.  Alexis [name] is a 22 y.o. at [redacted]w[redacted]d here in MAU reporting:   Contractions every: 5-6 minutes Onset of ctx: Today Pain score: Pain Assessment Pain Assessment: 0-10 Pain Score: 7  Pain Location: Abdomen Pain Descriptors / Indicators: Contraction, Tightness Pain Frequency: Intermittent Pain Onset: Progressive Multiple Pain Sites: Yes  PIH Assessment: Headache present: Yes ;No treatment attempted Visual disturbances: None RUQ pain/Epigastric: None Atypical edema: None Hx of HBP: Pre-E in last pregnancy BP Medications:  N/A   ROM: Membranes Sac Identifier: Sac 1 Membrane Status: Intact Amount: None  Vaginal Bleeding: Vaginal Bleeding Vag. Bleeding: None  Last SVE: closed on 3/28  Epidural: Planning  Fetal Movement: Reports positive FM  FHT: Fetal Heart Rate Mode: External Baseline Rate (A): 136 bpm Multiple birth?: No  Vitals:   09/21/22 1124  BP: 134/81  Pulse: 100  Resp: 16  Temp: 98.6 F (37 C)  SpO2: 100%       OB Office: [facility] GBS: Negative HSV: Denies hx of HSV Lab orders placed from triage: MAU Labor Eval

## 2022-09-21 NOTE — MAU Provider Note (Signed)
History     CSN: [id number]  Arrival date and time: 09/21/22 1106   None     Chief Complaint  Patient presents with   Labor Eval   Contractions   Headache   HPI Alexis [name] is a 22 y.o. G2P0102 at [redacted]w[redacted]d who presents to MAU for contractions and headache. She reports contractions and headache both started around 0645 this morning after getting up and going to the bathroom. Contractions are every 5-6 minutes apart. Headache is constant and more frontal. She currently rates headache 7/10. She has not taken anything to relieve headache. She reports she is concerned as she has a history of pre-eclampsia. She denies vision changes, RUQ/epigastric pain, vaginal bleeding or leaking fluid. Endorses active fetal movement.   Patient receives [facility], next appointment is on Thursday.   OB History     Gravida  2   Para  1   Term  0   Preterm  1   AB  0   Living  2      SAB  0   IAB  0   Ectopic  0   Multiple  1   Live Births  2           Past Medical History:  Diagnosis Date   Anxiety    COVID-19    Depression    GERD (gastroesophageal reflux disease)    Tachycardia    no medications-intermittent    Past Surgical History:  Procedure Laterality Date   NO PAST SURGERIES      Family History  Problem Relation Age of Onset   Diabetes Mother    Diabetes Mellitus II Mother    Hypertension Mother    Arthritis Father    Hypertension Father    Stomach cancer Maternal Aunt    Stomach cancer Paternal Grandmother     Social History   Tobacco Use   Smoking status: Never   Smokeless tobacco: Never  Vaping Use   Vaping Use: Never used  Substance Use Topics   Alcohol use: Not Currently   Drug use: Not Currently    Allergies:  Allergies  Allergen Reactions   Dust Mite Extract Hives   Mixed Ragweed Hives   Venofer [Iron Sucrose] Other (See Comments)    Chest pain    No medications prior to admission.   Review of Systems  Constitutional:  Negative.   Gastrointestinal:  Positive for abdominal pain (contractions).  Neurological:  Positive for headaches.  All other systems reviewed and are negative.  Physical Exam  Patient Vitals for the past 24 hrs:  BP Temp Temp src Pulse Resp SpO2 Height Weight  09/21/22 1320 131/61 -- -- 79 -- -- -- --  09/21/22 1155 135/86 -- -- 95 -- 100 % -- --  09/21/22 1130 131/77 -- -- 94 -- 100 % -- --  09/21/22 1124 134/81 98.6 F (37 C) Oral 100 16 100 % 5\' 6"  (1.676 m) 93 kg   Physical Exam Vitals and nursing note reviewed.  Constitutional:      General: She is not in acute distress. Eyes:     Extraocular Movements: Extraocular movements intact.     Pupils: Pupils are equal, round, and reactive to light.  Cardiovascular:     Rate and Rhythm: Normal rate.  Pulmonary:     Effort: Pulmonary effort is normal.  Abdominal:     Palpations: Abdomen is soft.     Tenderness: There is no abdominal tenderness.  Comments: Gravid    Musculoskeletal:        General: Normal range of motion.     Cervical back: Normal range of motion.  Skin:    General: Skin is warm and dry.  Neurological:     General: No focal deficit present.     Mental Status: She is alert and oriented to person, place, and time.  Psychiatric:        Mood and Affect: Mood normal.        Behavior: Behavior normal.   Dilation: Closed Effacement (%): Thick Cervical Position: Posterior Exam by:: Georgina Snell, RN  NST FHR: 130 bpm, moderate variability, +15x15 accels, no decels Toco: irregular ctx and ui  Results for orders placed or performed during the hospital encounter of 09/21/22 (from the past 24 hour(s))  CBC     Status: Abnormal   Collection Time: 09/21/22 12:08 PM  Result Value Ref Range   WBC 6.9 4.0 - 10.5 K/uL   RBC 3.29 (L) 3.87 - 5.11 MIL/uL   Hemoglobin 10.2 (L) 12.0 - 15.0 g/dL   HCT 90.3 (L) 00.9 - 23.3 %   MCV 96.7 80.0 - 100.0 fL   MCH 31.0 26.0 - 34.0 pg   MCHC 32.1 30.0 - 36.0 g/dL   RDW  00.7 62.2 - 63.3 %   Platelets 272 150 - 400 K/uL   nRBC 0.0 0.0 - 0.2 %  Comprehensive metabolic panel     Status: Abnormal   Collection Time: 09/21/22 12:08 PM  Result Value Ref Range   Sodium 137 135 - 145 mmol/L   Potassium 3.8 3.5 - 5.1 mmol/L   Chloride 107 98 - 111 mmol/L   CO2 19 (L) 22 - 32 mmol/L   Glucose, Bld 81 70 - 99 mg/dL   BUN <5 (L) 6 - 20 mg/dL   Creatinine, Ser 3.54 0.44 - 1.00 mg/dL   Calcium 8.4 (L) 8.9 - 10.3 mg/dL   Total Protein 6.3 (L) 6.5 - 8.1 g/dL   Albumin 2.8 (L) 3.5 - 5.0 g/dL   AST 15 15 - 41 U/L   ALT 14 0 - 44 U/L   Alkaline Phosphatase 88 38 - 126 U/L   Total Bilirubin 0.2 (L) 0.3 - 1.2 mg/dL   GFR, Estimated >56 >25 mL/min   Anion gap 11 5 - 15  Protein / creatinine ratio, urine     Status: None   Collection Time: 09/21/22 12:12 PM  Result Value Ref Range   Creatinine, Urine 44 mg/dL   Total Protein, Urine 6 mg/dL   Protein Creatinine Ratio 0.14 0.00 - 0.15 mg/mg[Cre]   MAU Course  Procedures  MDM CBC, CMP, UPCR Excedrin Tension NST  BP's normotensive. Labs unremarkable. Patient given Excedrin Tension with improvement in headache. Low suspicion for pre-eclampsia given normal labs, BP and improvement in headache.  NST reactive and reassuring. Cervix closed per RN.  Assessment and Plan   1. [redacted] weeks gestation of pregnancy   2. Uterine contractions   3. Headache in pregnancy, antepartum, third trimester    - Discharge home in stable condition - Return precautions given. Return to MAU as needed for new/worsening symptoms - Keep OB appointment as scheduled on Thursday 4/11   Brand Males, CNM 09/21/2022, 1:35 PM

## 2022-09-29 ENCOUNTER — Inpatient Hospital Stay (HOSPITAL_BASED_OUTPATIENT_CLINIC_OR_DEPARTMENT_OTHER): Payer: Medicaid Other

## 2022-09-29 ENCOUNTER — Inpatient Hospital Stay (HOSPITAL_COMMUNITY)
Admission: AD | Admit: 2022-09-29 | Discharge: 2022-09-29 | Disposition: A | Discharge status: Home / Self Care | Payer: Medicaid Other | Attending: Obstetrics and Gynecology | Admitting: Obstetrics and Gynecology

## 2022-09-29 ENCOUNTER — Encounter (HOSPITAL_COMMUNITY): Payer: Self-pay | Admitting: Obstetrics and Gynecology

## 2022-09-29 DIAGNOSIS — O368130 Decreased fetal movements, third trimester, not applicable or unspecified: Secondary | ICD-10-CM

## 2022-09-29 DIAGNOSIS — O09293 Supervision of pregnancy with other poor reproductive or obstetric history, third trimester: Secondary | ICD-10-CM | POA: Diagnosis not present

## 2022-09-29 DIAGNOSIS — Z3A39 39 weeks gestation of pregnancy: Secondary | ICD-10-CM

## 2022-09-29 DIAGNOSIS — R0602 Shortness of breath: Secondary | ICD-10-CM | POA: Diagnosis not present

## 2022-09-29 DIAGNOSIS — O368190 Decreased fetal movements, unspecified trimester, not applicable or unspecified: Secondary | ICD-10-CM

## 2022-09-29 DIAGNOSIS — O99891 Other specified diseases and conditions complicating pregnancy: Secondary | ICD-10-CM | POA: Diagnosis not present

## 2022-09-29 DIAGNOSIS — R0789 Other chest pain: Secondary | ICD-10-CM | POA: Insufficient documentation

## 2022-09-29 DIAGNOSIS — O26893 Other specified pregnancy related conditions, third trimester: Secondary | ICD-10-CM | POA: Diagnosis not present

## 2022-09-29 LAB — D-DIMER, QUANTITATIVE: D-Dimer, Quant: 0.91 ug/mL-FEU — ABNORMAL HIGH (ref 0.00–0.50)

## 2022-09-29 LAB — CBC
HCT: 31.9 % — ABNORMAL LOW (ref 36.0–46.0)
Hemoglobin: 10.3 g/dL — ABNORMAL LOW (ref 12.0–15.0)
MCH: 31.4 pg (ref 26.0–34.0)
MCHC: 32.3 g/dL (ref 30.0–36.0)
MCV: 97.3 fL (ref 80.0–100.0)
Platelets: 249 10*3/uL (ref 150–400)
RBC: 3.28 MIL/uL — ABNORMAL LOW (ref 3.87–5.11)
RDW: 13.8 % (ref 11.5–15.5)
WBC: 7 10*3/uL (ref 4.0–10.5)
nRBC: 0 % (ref 0.0–0.2)

## 2022-09-29 LAB — COMPREHENSIVE METABOLIC PANEL
ALT: 16 U/L (ref 0–44)
AST: 18 U/L (ref 15–41)
Albumin: 2.8 g/dL — ABNORMAL LOW (ref 3.5–5.0)
Alkaline Phosphatase: 97 U/L (ref 38–126)
Anion gap: 8 (ref 5–15)
BUN: 5 mg/dL — ABNORMAL LOW (ref 6–20)
CO2: 21 mmol/L — ABNORMAL LOW (ref 22–32)
Calcium: 8.7 mg/dL — ABNORMAL LOW (ref 8.9–10.3)
Chloride: 107 mmol/L (ref 98–111)
Creatinine, Ser: 0.61 mg/dL (ref 0.44–1.00)
GFR, Estimated: >60 mL/min (ref >60)
Glucose, Bld: 116 mg/dL — ABNORMAL HIGH (ref 70–99)
Potassium: 3.7 mmol/L (ref 3.5–5.1)
Sodium: 136 mmol/L (ref 135–145)
Total Bilirubin: 0.4 mg/dL (ref 0.3–1.2)
Total Protein: 6.2 g/dL — ABNORMAL LOW (ref 6.5–8.1)

## 2022-09-29 LAB — URINALYSIS, ROUTINE W REFLEX MICROSCOPIC
Bilirubin Urine: NEGATIVE
Glucose, UA: NEGATIVE mg/dL
Hgb urine dipstick: NEGATIVE
Ketones, ur: 5 mg/dL — AB
Leukocytes,Ua: NEGATIVE
Nitrite: NEGATIVE
Protein, ur: NEGATIVE mg/dL
Specific Gravity, Urine: 1.018 (ref 1.005–1.030)
pH: 7 (ref 5.0–8.0)

## 2022-09-29 NOTE — MAU Provider Note (Signed)
History     CSN: [id number]  Arrival date and time: 09/29/22 1035   None     No chief complaint on file.  HPI This is a 22 year old G2 P0-1-0-2 at 39 weeks and 0 days.  She presents with decreased fetal movement that started earlier today.  Normally she says feels 10+ movements a day but is only felt a couple of movements an hour so far.  Additionally, she complains of shortness of breath with right upper quadrant/right lower chest pain.  She states that it hurts to take a deep breath in that area.  She finds that she gets winded very easily when she starts to ambulate.  Has been worsening since last night.  No difficulty breathing while lying down.  No chest pressure.  OB History     Gravida  2   Para  1   Term  0   Preterm  1   AB  0   Living  2      SAB  0   IAB  0   Ectopic  0   Multiple  1   Live Births  2           Past Medical History:  Diagnosis Date   Anxiety    COVID-19    Depression    GERD (gastroesophageal reflux disease)    Tachycardia    no medications-intermittent    Past Surgical History:  Procedure Laterality Date   NO PAST SURGERIES      Family History  Problem Relation Age of Onset   Diabetes Mother    Diabetes Mellitus II Mother    Hypertension Mother    Arthritis Father    Hypertension Father    Stomach cancer Maternal Aunt    Stomach cancer Paternal Grandmother     Social History   Tobacco Use   Smoking status: Never   Smokeless tobacco: Never  Vaping Use   Vaping Use: Never used  Substance Use Topics   Alcohol use: Not Currently   Drug use: Not Currently    Allergies:  Allergies  Allergen Reactions   Dust Mite Extract Hives   Mixed Ragweed Hives   Venofer [Iron Sucrose] Other (See Comments)    Chest pain    Medications Prior to Admission  Medication Sig Dispense Refill Last Dose   aspirin 81 MG chewable tablet Chew by mouth daily.   09/28/2022   cetirizine (ZYRTEC) 10 MG tablet Take 10 mg by mouth  daily.   09/28/2022   Prenatal Vit-Fe Fumarate-FA (PRENATAL MULTIVITAMIN) TABS tablet Take 1 tablet by mouth daily at 12 noon.   09/28/2022   albuterol (VENTOLIN HFA) 108 (90 Base) MCG/ACT inhaler Inhale 1-2 puffs into the lungs every 6 (six) hours as needed for wheezing or shortness of breath. 1 each 0 More than a month   Doxylamine-Pyridoxine (DICLEGIS) 10-10 MG TBEC Take 1 tablet by mouth in the morning and at bedtime. 60 tablet 11     Review of Systems Physical Exam   Blood pressure 132/73, pulse 93, temperature 98.4 F (36.9 C), temperature source Oral, resp. rate 15, height 5\' 6"  (1.676 m), weight 89.5 kg, last menstrual period 12/30/2021, SpO2 98 %, not currently breastfeeding.  Physical Exam Vitals and nursing note reviewed.  Constitutional:      Appearance: Normal appearance.  Cardiovascular:     Rate and Rhythm: Normal rate and regular rhythm.  Pulmonary:     Effort: Pulmonary effort is normal.  Abdominal:  General: Abdomen is flat. There is no distension.     Palpations: Abdomen is soft.     Tenderness: There is no abdominal tenderness.  Skin:    Capillary Refill: Capillary refill takes less than 2 seconds.  Neurological:     General: No focal deficit present.     Mental Status: She is alert.  Psychiatric:        Mood and Affect: Mood normal.        Behavior: Behavior normal.        Thought Content: Thought content normal.        Judgment: Judgment normal.    Results for orders placed or performed during the hospital encounter of 09/29/22 (from the past 24 hour(s))  Urinalysis, Routine w reflex microscopic -Urine, Clean Catch     Status: Abnormal   Collection Time: 09/29/22 11:40 AM  Result Value Ref Range   Color, Urine YELLOW YELLOW   APPearance CLOUDY (A) CLEAR   Specific Gravity, Urine 1.018 1.005 - 1.030   pH 7.0 5.0 - 8.0   Glucose, UA NEGATIVE NEGATIVE mg/dL   Hgb urine dipstick NEGATIVE NEGATIVE   Bilirubin Urine NEGATIVE NEGATIVE   Ketones, ur 5 (A)  NEGATIVE mg/dL   Protein, ur NEGATIVE NEGATIVE mg/dL   Nitrite NEGATIVE NEGATIVE   Leukocytes,Ua NEGATIVE NEGATIVE  CBC     Status: Abnormal   Collection Time: 09/29/22 12:05 PM  Result Value Ref Range   WBC 7.0 4.0 - 10.5 K/uL   RBC 3.28 (L) 3.87 - 5.11 MIL/uL   Hemoglobin 10.3 (L) 12.0 - 15.0 g/dL   HCT 16.1 (L) 09.6 - 04.5 %   MCV 97.3 80.0 - 100.0 fL   MCH 31.4 26.0 - 34.0 pg   MCHC 32.3 30.0 - 36.0 g/dL   RDW 40.9 81.1 - 91.4 %   Platelets 249 150 - 400 K/uL   nRBC 0.0 0.0 - 0.2 %  Comprehensive metabolic panel     Status: Abnormal   Collection Time: 09/29/22 12:05 PM  Result Value Ref Range   Sodium 136 135 - 145 mmol/L   Potassium 3.7 3.5 - 5.1 mmol/L   Chloride 107 98 - 111 mmol/L   CO2 21 (L) 22 - 32 mmol/L   Glucose, Bld 116 (H) 70 - 99 mg/dL   BUN 5 (L) 6 - 20 mg/dL   Creatinine, Ser 7.82 0.44 - 1.00 mg/dL   Calcium 8.7 (L) 8.9 - 10.3 mg/dL   Total Protein 6.2 (L) 6.5 - 8.1 g/dL   Albumin 2.8 (L) 3.5 - 5.0 g/dL   AST 18 15 - 41 U/L   ALT 16 0 - 44 U/L   Alkaline Phosphatase 97 38 - 126 U/L   Total Bilirubin 0.4 0.3 - 1.2 mg/dL   GFR, Estimated >95 >62 mL/min   Anion gap 8 5 - 15  D-dimer, quantitative     Status: Abnormal   Collection Time: 09/29/22 12:05 PM  Result Value Ref Range   D-Dimer, Quant 0.91 (H) 0.00 - 0.50 ug/mL-FEU     MAU Course  Procedures NST:  Baseline: 120  Variability: moderate Accelerations: present  Decelerations: none Contractions: occasional  MDM Patient discussed with Dr Hester Mates. BPP 10/10. Comfortable with patient being sent home.  Assessment and Plan   1. [redacted] weeks gestation of pregnancy   2. SOB (shortness of breath)   3. Decreased fetal movement during pregnancy, antepartum, single or unspecified fetus    It appears that the shortness of breath  and right upper quadrant pain is due to the baby kicking in that side. BPP 10 out of 10. D-dimer appropriate for gestational age.  No concern of DVE/PE. Follow-up  outpatient appointment this week.  [name] 09/29/2022, 1:09 PM

## 2022-09-29 NOTE — MAU Note (Signed)
Alexis [name] is a 22 y.o. at [redacted]w[redacted]d here in MAU reporting: ever since like 0600, hasn't felt the baby move as much as he usually does.  Has only felt him move like 6 or 7 times since then. All weekend, has been dealing with SOB< can't take a deep breath in .  Denies fever, cough, has had some like nasal congestion- had that like for 2 wks. When she tries to take a deep breath, she has pain in her RUQ. ? Braxton hicks. Onset of complaint: DFM 0600, SOB over the weekend Pain score: mild, but a 6 Vitals:   09/29/22 1056 09/29/22 1058  BP:  132/73  Pulse:  93  Resp:  15  Temp:  98.4 F (36.9 C)  SpO2: 100% 100%     FHT:141 Lab orders placed from triage:

## 2022-10-02 ENCOUNTER — Other Ambulatory Visit: Payer: Self-pay | Admitting: Obstetrics and Gynecology

## 2022-10-02 ENCOUNTER — Encounter (HOSPITAL_COMMUNITY): Payer: Self-pay | Admitting: *Deleted

## 2022-10-02 ENCOUNTER — Telehealth (HOSPITAL_COMMUNITY): Payer: Self-pay | Admitting: *Deleted

## 2022-10-02 NOTE — Telephone Encounter (Signed)
Preadmission screen  

## 2022-10-05 ENCOUNTER — Encounter (HOSPITAL_COMMUNITY): Payer: Self-pay | Admitting: Obstetrics and Gynecology

## 2022-10-05 ENCOUNTER — Other Ambulatory Visit: Payer: Self-pay

## 2022-10-05 ENCOUNTER — Inpatient Hospital Stay (EMERGENCY_DEPARTMENT_HOSPITAL)
Admission: AD | Admit: 2022-10-05 | Discharge: 2022-10-05 | Disposition: A | Discharge status: Home / Self Care | Payer: Medicaid Other | Source: Home / Self Care | Attending: Obstetrics and Gynecology | Admitting: Obstetrics and Gynecology

## 2022-10-05 DIAGNOSIS — Z3A39 39 weeks gestation of pregnancy: Secondary | ICD-10-CM | POA: Insufficient documentation

## 2022-10-05 DIAGNOSIS — O471 False labor at or after 37 completed weeks of gestation: Secondary | ICD-10-CM | POA: Insufficient documentation

## 2022-10-05 DIAGNOSIS — O479 False labor, unspecified: Secondary | ICD-10-CM | POA: Diagnosis not present

## 2022-10-05 HISTORY — DX: Anemia, unspecified: D64.9

## 2022-10-05 NOTE — MAU Note (Signed)
Delisa Finck is a 22 y.o. at [redacted]w[redacted]d here in MAU reporting: she's having ctxs that are 5 minutes apart and wiped a noticed large amount mucus type VB.  Also states is nauseated and vomited once this morning.  Denies LOF.  Endorses +FM LMP: NA Onset of complaint: today Pain score: 7 Vitals:   10/05/22 1426  BP: 136/78  Pulse: 100  Resp: 18  Temp: 98.8 F (37.1 C)  SpO2: 100%     FHT: 135 bpm Lab orders placed from triage:  None

## 2022-10-05 NOTE — MAU Provider Note (Signed)
  S: [name] is a 22 y.o. [phone] at [redacted]w[redacted]d  who presents to MAU today complaining contractions q 5 minutes. She endorses passing mucus w/ scant vaginal bleeding. She denies LOF. She reports normal fetal movement.    O: BP 133/77   Pulse 99   Temp 98.8 F (37.1 C) (Oral)   Resp 17   Ht  (1.676 m)   Wt 91.2 kg   LMP 12/30/2021   SpO2 100%   BMI 32.44 kg/m  GENERAL: Well-developed, well-nourished female in no acute distress.  HEAD: Normocephalic, atraumatic.  CHEST: Normal effort of breathing, regular heart rate ABDOMEN: Soft, nontender, gravid  Cervical exam:  Dilation: 1 Effacement (%): 70 Cervical Position: Posterior Station: -3 Presentation: Vertex Exam by:: Kandis Fantasia RN   Fetal Monitoring: Baseline: 130 Variability: mod Accelerations: 15x15 Decelerations: none Contractions: 3-5, mild  MAU Course/MDM Exam performed by RN. C/W false labor. FHR reactive/Cat !. Nml BP. CNM agrees w/ POC.   A: SIUP at 108w6d  False labor  P: D/C home is stable condition. Labor precautions and FKCs  Follow-up Information     [facility] Follow up.   Specialty: Obstetrics and Gynecology Contact information: [address] [place] [address] [phone]                 Katrinka Blazing [place], [place] 10/05/2022 3:12 PM

## 2022-10-06 ENCOUNTER — Inpatient Hospital Stay (HOSPITAL_COMMUNITY)
Admission: AD | Admit: 2022-10-06 | Discharge: 2022-10-08 | DRG: 806 | Disposition: A | Discharge status: Home / Self Care | Payer: Medicaid Other | Attending: Obstetrics and Gynecology | Admitting: Obstetrics and Gynecology

## 2022-10-06 ENCOUNTER — Encounter (HOSPITAL_COMMUNITY): Payer: Self-pay | Admitting: Obstetrics and Gynecology

## 2022-10-06 ENCOUNTER — Inpatient Hospital Stay (HOSPITAL_COMMUNITY): Payer: Medicaid Other | Admitting: Anesthesiology

## 2022-10-06 DIAGNOSIS — O1092 Unspecified pre-existing hypertension complicating childbirth: Secondary | ICD-10-CM | POA: Diagnosis present

## 2022-10-06 DIAGNOSIS — I471 Supraventricular tachycardia, unspecified: Secondary | ICD-10-CM | POA: Diagnosis present

## 2022-10-06 DIAGNOSIS — Z8616 Personal history of COVID-19: Secondary | ICD-10-CM

## 2022-10-06 DIAGNOSIS — Z3A40 40 weeks gestation of pregnancy: Secondary | ICD-10-CM

## 2022-10-06 DIAGNOSIS — O9942 Diseases of the circulatory system complicating childbirth: Secondary | ICD-10-CM | POA: Diagnosis present

## 2022-10-06 DIAGNOSIS — O26893 Other specified pregnancy related conditions, third trimester: Secondary | ICD-10-CM | POA: Diagnosis present

## 2022-10-06 HISTORY — DX: Supraventricular tachycardia, unspecified: I47.10

## 2022-10-06 LAB — LACTATE DEHYDROGENASE: LDH: 145 U/L (ref 98–192)

## 2022-10-06 LAB — COMPREHENSIVE METABOLIC PANEL
ALT: 17 U/L (ref 0–44)
AST: 23 U/L (ref 15–41)
Albumin: 2.6 g/dL — ABNORMAL LOW (ref 3.5–5.0)
Alkaline Phosphatase: 91 U/L (ref 38–126)
Anion gap: 7 (ref 5–15)
BUN: 7 mg/dL (ref 6–20)
CO2: 21 mmol/L — ABNORMAL LOW (ref 22–32)
Calcium: 7.9 mg/dL — ABNORMAL LOW (ref 8.9–10.3)
Chloride: 109 mmol/L (ref 98–111)
Creatinine, Ser: 0.7 mg/dL (ref 0.44–1.00)
GFR, Estimated: >60 mL/min (ref >60)
Glucose, Bld: 90 mg/dL (ref 70–99)
Potassium: 3.4 mmol/L — ABNORMAL LOW (ref 3.5–5.1)
Sodium: 137 mmol/L (ref 135–145)
Total Bilirubin: 0.8 mg/dL (ref 0.3–1.2)
Total Protein: 5.9 g/dL — ABNORMAL LOW (ref 6.5–8.1)

## 2022-10-06 LAB — PROTEIN / CREATININE RATIO, URINE
Creatinine, Urine: 298 mg/dL
Protein Creatinine Ratio: 0.13 mg/mg{Cre} (ref 0.00–0.15)
Total Protein, Urine: 38 mg/dL

## 2022-10-06 LAB — CBC
HCT: 33.4 % — ABNORMAL LOW (ref 36.0–46.0)
Hemoglobin: 11 g/dL — ABNORMAL LOW (ref 12.0–15.0)
MCH: 31.5 pg (ref 26.0–34.0)
MCHC: 32.9 g/dL (ref 30.0–36.0)
MCV: 95.7 fL (ref 80.0–100.0)
Platelets: 222 10*3/uL (ref 150–400)
RBC: 3.49 MIL/uL — ABNORMAL LOW (ref 3.87–5.11)
RDW: 13.8 % (ref 11.5–15.5)
WBC: 5.8 10*3/uL (ref 4.0–10.5)
nRBC: 0 % (ref 0.0–0.2)

## 2022-10-06 LAB — URIC ACID: Uric Acid, Serum: 4.6 mg/dL (ref 2.5–7.1)

## 2022-10-06 LAB — TYPE AND SCREEN
ABO/RH(D): O POS
Antibody Screen: NEGATIVE

## 2022-10-06 LAB — MAGNESIUM: Magnesium: 1.7 mg/dL (ref 1.7–2.4)

## 2022-10-06 LAB — RPR: RPR Ser Ql: NONREACTIVE

## 2022-10-06 LAB — TSH: TSH: 0.753 u[IU]/mL (ref 0.350–4.500)

## 2022-10-06 MED ORDER — SENNOSIDES-DOCUSATE SODIUM 8.6-50 MG PO TABS
2.0000 | ORAL_TABLET | Freq: Every day | ORAL | Status: DC
  Administered 2022-10-07 – 2022-10-08 (×2): 2 via ORAL
  Filled 2022-10-06 (×3): qty 2

## 2022-10-06 MED ORDER — BENZOCAINE-MENTHOL 20-0.5 % EX AERO: 1.0000 | INHALATION_SPRAY | CUTANEOUS | Status: DC | PRN

## 2022-10-06 MED ORDER — ACETAMINOPHEN 325 MG PO TABS
650.0000 mg | ORAL_TABLET | ORAL | Status: DC | PRN
  Administered 2022-10-06 – 2022-10-07 (×3): 650 mg via ORAL

## 2022-10-06 MED ORDER — OXYCODONE-ACETAMINOPHEN 5-325 MG PO TABS: 2.0000 | ORAL_TABLET | ORAL | Status: DC | PRN

## 2022-10-06 MED ORDER — LACTATED RINGERS IV SOLN
500.0000 mL | Freq: Once | INTRAVENOUS | Status: AC
  Administered 2022-10-06: 500 mL via INTRAVENOUS

## 2022-10-06 MED ORDER — OXYTOCIN-SODIUM CHLORIDE 30-0.9 UT/500ML-% IV SOLN
1.0000 m[IU]/min | INTRAVENOUS | Status: DC
  Administered 2022-10-06: 1 m[IU]/min via INTRAVENOUS
  Filled 2022-10-06: qty 500

## 2022-10-06 MED ORDER — LIDOCAINE-EPINEPHRINE (PF) 1.5 %-1:200000 IJ SOLN
INTRAMUSCULAR | Status: DC | PRN
  Administered 2022-10-06: 5 mL via EPIDURAL

## 2022-10-06 MED ORDER — OXYCODONE-ACETAMINOPHEN 5-325 MG PO TABS: 1.0000 | ORAL_TABLET | ORAL | Status: DC | PRN

## 2022-10-06 MED ORDER — ACETAMINOPHEN 325 MG PO TABS
650.0000 mg | ORAL_TABLET | ORAL | Status: DC | PRN
  Administered 2022-10-06: 650 mg via ORAL
  Filled 2022-10-06 (×5): qty 2

## 2022-10-06 MED ORDER — OXYTOCIN BOLUS FROM INFUSION
333.0000 mL | Freq: Once | INTRAVENOUS | Status: AC
  Administered 2022-10-06: 333 mL via INTRAVENOUS

## 2022-10-06 MED ORDER — LACTATED RINGERS IV SOLN: 500.0000 mL | INTRAVENOUS | Status: DC | PRN

## 2022-10-06 MED ORDER — IBUPROFEN 600 MG PO TABS
600.0000 mg | ORAL_TABLET | Freq: Four times a day (QID) | ORAL | Status: DC
  Administered 2022-10-06 – 2022-10-08 (×7): 600 mg via ORAL
  Filled 2022-10-06 (×7): qty 1

## 2022-10-06 MED ORDER — SOD CITRATE-CITRIC ACID 500-334 MG/5ML PO SOLN
30.0000 mL | ORAL | Status: DC | PRN
  Administered 2022-10-06: 30 mL via ORAL
  Filled 2022-10-06: qty 30

## 2022-10-06 MED ORDER — FENTANYL-BUPIVACAINE-NACL 0.5-0.125-0.9 MG/250ML-% EP SOLN
12.0000 mL/h | EPIDURAL | Status: DC | PRN
  Administered 2022-10-06: 12 mL/h via EPIDURAL
  Filled 2022-10-06: qty 250

## 2022-10-06 MED ORDER — ONDANSETRON HCL 4 MG PO TABS: 4.0000 mg | ORAL_TABLET | ORAL | Status: DC | PRN

## 2022-10-06 MED ORDER — TERBUTALINE SULFATE 1 MG/ML IJ SOLN: 0.2500 mg | Freq: Once | INTRAMUSCULAR | Status: DC | PRN

## 2022-10-06 MED ORDER — OXYTOCIN-SODIUM CHLORIDE 30-0.9 UT/500ML-% IV SOLN
2.5000 [IU]/h | INTRAVENOUS | Status: DC
  Administered 2022-10-06: 2.5 [IU]/h via INTRAVENOUS

## 2022-10-06 MED ORDER — COCONUT OIL OIL: 1.0000 | TOPICAL_OIL | Status: DC | PRN

## 2022-10-06 MED ORDER — DIPHENHYDRAMINE HCL 50 MG/ML IJ SOLN
12.5000 mg | INTRAMUSCULAR | Status: DC | PRN
  Administered 2022-10-06: 12.5 mg via INTRAVENOUS
  Filled 2022-10-06: qty 1

## 2022-10-06 MED ORDER — WITCH HAZEL-GLYCERIN EX PADS: 1.0000 | MEDICATED_PAD | CUTANEOUS | Status: DC | PRN

## 2022-10-06 MED ORDER — LIDOCAINE HCL (PF) 1 % IJ SOLN: 30.0000 mL | INTRAMUSCULAR | Status: DC | PRN

## 2022-10-06 MED ORDER — OXYCODONE HCL 5 MG PO TABS: 10.0000 mg | ORAL_TABLET | ORAL | Status: DC | PRN

## 2022-10-06 MED ORDER — SIMETHICONE 80 MG PO CHEW: 80.0000 mg | CHEWABLE_TABLET | ORAL | Status: DC | PRN

## 2022-10-06 MED ORDER — DIBUCAINE (PERIANAL) 1 % EX OINT: 1.0000 | TOPICAL_OINTMENT | CUTANEOUS | Status: DC | PRN

## 2022-10-06 MED ORDER — EPHEDRINE 5 MG/ML INJ: 10.0000 mg | INTRAVENOUS | Status: DC | PRN

## 2022-10-06 MED ORDER — ONDANSETRON HCL 4 MG/2ML IJ SOLN: 4.0000 mg | INTRAMUSCULAR | Status: DC | PRN

## 2022-10-06 MED ORDER — PHENYLEPHRINE 80 MCG/ML (10ML) SYRINGE FOR IV PUSH (FOR BLOOD PRESSURE SUPPORT): 80.0000 ug | PREFILLED_SYRINGE | INTRAVENOUS | Status: DC | PRN

## 2022-10-06 MED ORDER — LACTATED RINGERS IV SOLN
INTRAVENOUS | Status: DC
  Administered 2022-10-06: 1000 mL via INTRAVENOUS

## 2022-10-06 MED ORDER — ADENOSINE 6 MG/2ML IV SOLN
INTRAVENOUS | Status: AC
  Administered 2022-10-06: 6 mg via INTRAVENOUS
  Filled 2022-10-06: qty 2

## 2022-10-06 MED ORDER — PHENYLEPHRINE 80 MCG/ML (10ML) SYRINGE FOR IV PUSH (FOR BLOOD PRESSURE SUPPORT)
80.0000 ug | PREFILLED_SYRINGE | INTRAVENOUS | Status: DC | PRN
  Administered 2022-10-06: 240 ug via INTRAVENOUS
  Administered 2022-10-06: 160 ug via INTRAVENOUS

## 2022-10-06 MED ORDER — LACTATED RINGERS IV SOLN: INTRAVENOUS | Status: DC

## 2022-10-06 MED ORDER — FENTANYL CITRATE (PF) 100 MCG/2ML IJ SOLN: 50.0000 ug | INTRAMUSCULAR | Status: DC | PRN

## 2022-10-06 MED ORDER — METOPROLOL TARTRATE 5 MG/5ML IV SOLN
5.0000 mg | Freq: Four times a day (QID) | INTRAVENOUS | Status: DC | PRN
  Filled 2022-10-06: qty 5

## 2022-10-06 MED ORDER — ADENOSINE 12 MG/4ML IV SOLN
INTRAVENOUS | Status: AC
  Filled 2022-10-06: qty 4

## 2022-10-06 MED ORDER — ZOLPIDEM TARTRATE 5 MG PO TABS: 5.0000 mg | ORAL_TABLET | Freq: Every evening | ORAL | Status: DC | PRN

## 2022-10-06 MED ORDER — OXYCODONE HCL 5 MG PO TABS: 5.0000 mg | ORAL_TABLET | ORAL | Status: DC | PRN

## 2022-10-06 MED ORDER — ADENOSINE 6 MG/2ML IV SOLN: 6.0000 mg | INTRAVENOUS | Status: AC

## 2022-10-06 MED ORDER — FLEET ENEMA 7-19 GM/118ML RE ENEM: 1.0000 | ENEMA | RECTAL | Status: DC | PRN

## 2022-10-06 MED ORDER — DIPHENHYDRAMINE HCL 25 MG PO CAPS: 25.0000 mg | ORAL_CAPSULE | Freq: Four times a day (QID) | ORAL | Status: DC | PRN

## 2022-10-06 MED ORDER — HYDROXYZINE HCL 50 MG PO TABS: 50.0000 mg | ORAL_TABLET | Freq: Four times a day (QID) | ORAL | Status: DC | PRN

## 2022-10-06 MED ORDER — ONDANSETRON HCL 4 MG/2ML IJ SOLN: 4.0000 mg | Freq: Four times a day (QID) | INTRAMUSCULAR | Status: DC | PRN

## 2022-10-06 NOTE — MAU Note (Signed)
Adenosine 6 mg given.

## 2022-10-06 NOTE — Anesthesia Procedure Notes (Signed)
Epidural Patient location during procedure: OB Start time: 10/06/2022 7:30 AM End time: 10/06/2022 7:40 AM  Staffing Anesthesiologist: Atilano Median, DO Performed: anesthesiologist   Preanesthetic Checklist Completed: patient identified, IV checked, site marked, risks and benefits discussed, surgical consent, monitors and equipment checked, pre-op evaluation and timeout performed  Epidural Patient position: sitting Prep: ChloraPrep Patient monitoring: heart rate, continuous pulse ox and blood pressure Approach: midline Location: L3-L4 Injection technique: LOR saline  Needle:  Needle type: Tuohy  Needle gauge: 17 G Needle length: 9 cm Needle insertion depth: 7 cm Catheter type: closed end flexible Catheter size: 20 Guage Catheter at skin depth: 12 cm Test dose: negative and 1.5% lidocaine with Epi 1:200 K  Assessment Events: blood not aspirated, no cerebrospinal fluid, injection not painful, no injection resistance and no paresthesia  Additional Notes Patient identified. Risks/Benefits/Options discussed with patient including but not limited to bleeding, infection, nerve damage, paralysis, failed block, incomplete pain control, headache, blood pressure changes, nausea, vomiting, reactions to medications, itching and postpartum back pain. Confirmed with bedside nurse the patient's most recent platelet count. Confirmed with patient that they are not currently taking any anticoagulation, have any bleeding history or any family history of bleeding disorders. Patient expressed understanding and wished to proceed. All questions were answered. Sterile technique was used throughout the entire procedure. Please see nursing notes for vital signs. Test dose was given through epidural catheter and negative prior to continuing to dose epidural or start infusion. Warning signs of high block given to the patient including shortness of breath, tingling/numbness in hands, complete motor block,  or any concerning symptoms with instructions to call for help. Patient was given instructions on fall risk and not to get out of bed. All questions and concerns addressed with instructions to call with any issues or inadequate analgesia.    Reason for block:procedure for pain

## 2022-10-06 NOTE — Progress Notes (Signed)
Subjective: Postpartum Day # 1 : S/P NSVD due to pt was admitted on 4/22 for IOL for prolonged latent phase and SVT, s/p IV x1 dose adenosine on tele, HR currently 76, PCR was 0.13, TSH was 0.753, pending Free T4 8, asymptomatic now, h/o anxiety and depression no meds, mood stable. Pt progressed to SVD on 4/22 with pitocin and AROM, had SVD on 4/22 over intact perineum with wbl of , and hgb drop of 11-10.3. Patient up ad lib, denies syncope or dizziness. Reports consuming regular diet without issues and denies N/V. Patient reports 0 bowel movement + passing flatus.  Denies issues with urination and reports bleeding is "lighter."  Patient is breastfeeding and reports going well.  Desires undecided for postpartum contraception.  Pain is being appropriately managed with use of po meds.   No laceration Feeding:  breast Contraceptive plan:  undecided BB: Circ in pt desired  Objective: Vital signs in last 24 hours: Patient Vitals for the past 24 hrs:  BP Temp Temp src Pulse Resp SpO2  10/07/22 0528 109/71 98.2 F (36.8 C) Oral 68 16 100 %  10/06/22 2341 113/77 98.3 F (36.8 C) Oral 77 -- 100 %  10/06/22 1958 (!) 114/58 98.8 F (37.1 C) Oral 86 17 99 %  10/06/22 1600 101/89 99.3 F (37.4 C) Oral (!) 109 18 98 %  10/06/22 1442 111/63 100.2 F (37.9 C) -- -- 18 100 %  10/06/22 1316 125/86 -- -- (!) 103 -- --  10/06/22 1301 132/79 -- -- 96 -- --  10/06/22 1245 121/73 -- -- (!) 102 -- --  10/06/22 1231 114/79 -- -- (!) 101 -- --  10/06/22 1215 129/66 -- -- (!) 104 -- --  10/06/22 1208 (!) 140/62 -- -- (!) 107 -- --  10/06/22 1001 110/60 -- -- 85 -- --  10/06/22 0931 (!) 107/59 -- -- 89 -- --  10/06/22 0901 119/66 -- -- 100 -- --  10/06/22 0831 (!) 98/49 -- -- 92 -- --  10/06/22 0816 (!) 117/49 -- -- 90 -- --  10/06/22 0810 (!) 100/40 -- -- 95 -- --  10/06/22 0806 (!) 100/55 -- -- 97 -- --  10/06/22 0800 109/62 98.7 F (37.1 C) Oral 95 -- --  10/06/22 0757 90/71 -- -- 86 -- --   10/06/22 0748 (!) 93/30 -- -- 95 -- --  10/06/22 0745 (!) 79/24 -- -- 81 -- --  10/06/22 0743 (!) 60/19 -- -- 85 -- --  10/06/22 0730 -- -- -- -- -- 100 %  10/06/22 0700 121/70 -- -- 100 -- --     Physical Exam:  General: alert, cooperative, and appears stated age Mood/Affect: happy Lungs: clear to auscultation, no wheezes, rales or rhonchi, symmetric air entry.  Heart: normal rate, regular rhythm, normal S1, S2, no murmurs, rubs, clicks or gallops. Breast: breasts appear normal, no suspicious masses, no skin or nipple changes or axillary nodes. Abdomen:  + bowel sounds, soft, non-tender GU: perineum intact, healing well. No signs of external hematomas.  Uterine Fundus: firm Lochia: appropriate Skin: Warm, Dry. DVT Evaluation: No evidence of DVT seen on physical exam. Negative Homan's sign. No cords or calf tenderness. No significant calf/ankle edema.     Latest Ref Rng & Units 10/07/2022    5:40 AM 10/06/2022    2:35 AM 09/29/2022   12:05 PM  CBC  WBC 4.0 - 10.5 K/uL 6.6  5.8  7.0   Hemoglobin 12.0 - 15.0 g/dL 10.3  11.0  10.3   Hematocrit 36.0 - 46.0 % 30.5  33.4  31.9   Platelets 150 - 400 K/uL 215  222  249     Results for orders placed or performed during the hospital encounter of 10/06/22 (from the past 24 hour(s))  CBC     Status: Abnormal   Collection Time: 10/07/22  5:40 AM  Result Value Ref Range   WBC 6.6 4.0 - 10.5 K/uL   RBC 3.21 (L) 3.87 - 5.11 MIL/uL   Hemoglobin 10.3 (L) 12.0 - 15.0 g/dL   HCT 10.2 (L) 72.5 - 36.6 %   MCV 95.0 80.0 - 100.0 fL   MCH 32.1 26.0 - 34.0 pg   MCHC 33.8 30.0 - 36.0 g/dL   RDW 44.0 34.7 - 42.5 %   Platelets 215 150 - 400 K/uL   nRBC 0.0 0.0 - 0.2 %     CBG (last 3)  No results for input(s): "GLUCAP" in the last 72 hours.   I/O last 3 completed shifts: In: 0  Out: 313 [Urine:235; Blood:78]   Assessment Postpartum Day # 1 : S/P NSVD due to pt was admitted on 4/22 for IOL for prolonged latent phase and SVT, s/p IV x1  dose adenosine on tele, HR currently 76, PCR was 0.13, TSH was 0.753, pending Free T4 8, asymptomatic now, h/o anxiety and depression no meds, mood stable. Pt progressed to SVD on 4/22 with pitocin and AROM, had SVD on 4/22 over intact perineum with wbl of , and hgb drop of 11-10.3. Patient up ad lib, denies syncope or dizziness. Reports consuming regular diet without issues and denies N/V. Patient reports 0 bowel movement + passing flatus.  Denies issues with urination and reports bleeding is "lighter."  Patient is breastfeeding and reports going well.  Desires undecided for postpartum contraception.  Pain is being appropriately managed with use of po meds.  Pt stable. -1 involution. Breastfeeding. Hemodynamically stable.   Plan: Continue other mgmt as ordered SVT: can come off telemetry today, if HR >150 will give IV metoprolol Q5H PRN  VTE prophylactics: Early ambulated as tolerates.  Pain control: Motrin/Tylenol PRN Education given regarding options for contraception, including barrier methods, injectable contraception, IUD placement, oral contraceptives.  Plan for discharge tomorrow, Discharge home, Breastfeeding, and Circumcision prior to discharge  Dr. Normand Sloop to be updated on patient status  [facility] CNM, FNP-C, PMHNP-BC  [address]  [place], [place] [address]  Cell: [phone]  Office Phone: [phone] Fax: [phone] 10/07/2022  6:55 AM

## 2022-10-06 NOTE — MAU Provider Note (Signed)
MAU History & Physical  10/06/2022 - 2:48 AM Primary OBGYN: [facility]  Chief Complaint: SVT and labor eval  History of Present Illness  22 y.o. G2P0102 at [redacted]w[redacted]d, with the above CC. Pregnancy complicated by: CHTN, h/o palpitations.  [name] states starting 2 hours prior to arrival (approximately 2300 on 4/21) she started having chest discomfort and with no prior history. SVT diagnosed by EMS and given IVF bolus, BPs 100s/70s and HR 170s.   Patient seen earlier in the day on 4/21 and had a negative eval with her cervix being 1.5cm  Review of Systems: as noted in the History of Present Illness.  Patient Active Problem List   Diagnosis Date Noted   SVT (supraventricular tachycardia) 10/06/2022   Prolonged latent phase of labor 10/06/2022   COVID-19    Anxiety    Chronic hypertension 08/27/2020   GERD (gastroesophageal reflux disease)     PMHx:  Past Medical History:  Diagnosis Date   Anemia    Anxiety    COVID-19    Depression    GERD (gastroesophageal reflux disease)    History of postpartum hemorrhage, currently pregnant    History of pre-eclampsia    Tachycardia    no medications-intermittent   PSHx:  Past Surgical History:  Procedure Laterality Date   NO PAST SURGERIES     Medications:  Medications Prior to Admission  Medication Sig Dispense Refill Last Dose   aspirin 81 MG chewable tablet Chew by mouth daily.   10/05/2022   cetirizine (ZYRTEC) 10 MG tablet Take 10 mg by mouth daily.   10/05/2022   Ferrous Sulfate (IRON) 325 (65 Fe) MG TABS Take 325 mg by mouth in the morning and at bedtime.   10/05/2022   Prenatal Vit-Fe Fumarate-FA (PRENATAL MULTIVITAMIN) TABS tablet Take 1 tablet by mouth daily at 12 noon.   10/05/2022   albuterol (VENTOLIN HFA) 108 (90 Base) MCG/ACT inhaler Inhale 1-2 puffs into the lungs every 6 (six) hours as needed for wheezing or shortness of breath. 1 each 0    Doxylamine-Pyridoxine (DICLEGIS) 10-10 MG TBEC  Take 1 tablet by mouth in the morning and at bedtime. 60 tablet 11      Allergies: is allergic to dust mite extract, mixed ragweed, and venofer [iron sucrose]. OBHx:  OB History  Gravida Para Term Preterm AB Living  2 1 0 1 0 2  SAB IAB Ectopic Multiple Live Births  0 0 0 1 2    # Outcome Date GA Lbr Len/2nd Weight Sex Delivery Anes PTL Lv  2 Current           1A Preterm 11/01/20 [redacted]w[redacted]d 04:04 / 01:27 2385 g M Vag-Spont EPI  LIV  1B Preterm 11/01/20 [redacted]w[redacted]d 04:04 / 01:43 2275 g F Vag-Spont EPI  LIV         FHx:  Family History  Problem Relation Age of Onset   Diabetes Mother    Diabetes Mellitus II Mother    Hypertension Mother    Arthritis Father    Hypertension Father    Stomach cancer Maternal Aunt    Stomach cancer Paternal Grandmother    Soc Hx:  Social History   Socioeconomic History   Marital status: Single    Spouse name: Not on file   Number of children: Not on file   Years of education: Not on file   Highest education level: Not on file  Occupational History   Not on file  Tobacco  Use   Smoking status: Never   Smokeless tobacco: Never  Vaping Use   Vaping Use: Never used  Substance and Sexual Activity   Alcohol use: Not Currently   Drug use: Not Currently   Sexual activity: Yes    Birth control/protection: None  Other Topics Concern   Not on file  Social History Narrative   Not on file   Social Determinants of Health   Financial Resource Strain: Not on file  Food Insecurity: Not on file  Transportation Needs: Not on file  Physical Activity: Not on file  Stress: Not on file  Social Connections: Not on file  Intimate Partner Violence: Not on file    Objective  Patient Vitals for the past 12 hrs:  BP Temp src Pulse Resp SpO2  10/06/22 0233 (!) 118/57 -- (!) 113 -- --  10/06/22 0215 117/71 -- (!) 115 -- --  10/06/22 0125 107/64 -- (!) 120 -- --  10/06/22 0121 117/73 -- (!) 114 -- --  10/06/22 0107 100/61 Axillary -- 17 --  10/06/22 0105 --  -- -- -- 100 %   On arrival, VS stable and baby category I with q6-47m UCs. NAD, mild to moderate distress with contraction RRR, HR 150s 12 lead confirms SVT in the 150s    Labs  Recent Labs  Lab 09/29/22 1205  WBC 7.0  HGB 10.3*  HCT 31.9*  PLT 249    Recent Labs  Lab 09/29/22 1205  NA 136  K 3.7  CL 107  CO2 21*  BUN 5*  CREATININE 0.61  CALCIUM 8.7*  PROT 6.2*  BILITOT 0.4  ALKPHOS 97  ALT 16  AST 18  GLUCOSE 116*    Radiology none   Assessment & Plan  No change in HR with valsalva x 2. Adenosine  x 1 given and maternal HR to the 70s and then 120s.    Labs ordered and f/u cervical exam  Cornelia Copa MD Attending Center for [facility])

## 2022-10-06 NOTE — MAU Provider Note (Signed)
MAU Note  Patient states that starting 2 hours ago she started having chest discomfort and with no prior history.  SVT diagnosed by EMS and given IVF bolus, BPs 100s/70s and HR 170s.  On arrival, VS stable and baby category I with q6-87m UCs. NAD, mild to moderate distress with contraction RRR, HR 150s 12 lead confirms SVT in the 150s  No change in HR with valsalva x 2. Adenosine  x 1 given and maternal HR to the 70s and then 120s.   Labs ordered and f/u cervical exam  Cornelia Copa MD Attending Center for [facility] (Faculty Practice) 10/06/2022 Time: 0130

## 2022-10-06 NOTE — MAU Note (Signed)
Awaiting room in LD-pt reports chest pressure has continued to decrease  - remains lightheaded and dizzy supine at rest.  Denies requests or needs at this time. Ice water provided prt pt request Dr.Ogunbekun in to speak with pt-new orders received.

## 2022-10-06 NOTE — Progress Notes (Signed)
Jacora Hopkins is a 22 y.o. G2P0102 at [redacted]w[redacted]d admitted for induction of labor due to sustained SVT requiring adenosine, like due to stress from prolonged latent phase labor.  Subjective: Pitocin started and now at 3 mu/min. Patient doing well not requesting anything for pain.  Objective: BP 125/75   Pulse (!) 104   Temp 100.1 F (37.8 C) (Oral)   Resp 17   Ht  (1.676 m)   Wt 91.2 kg   LMP 12/30/2021   SpO2 100%   BMI 32.44 kg/m  No intake/output data recorded. No intake/output data recorded.  FHT:  FHR: 130 bpm, variability: moderate,  accelerations:  Present,  decelerations:  Absent UC:   irregular, every 3-6 minutes SVE:   Dilation: 2 Effacement (%): 80 Station: -2 Exam by:: Karl Ito, rnc  Labs: Lab Results  Component Value Date   WBC 5.8 10/06/2022   HGB 11.0 (L) 10/06/2022   HCT 33.4 (L) 10/06/2022   MCV 95.7 10/06/2022   PLT 222 10/06/2022    Assessment / Plan: Induction of labor due to sustained SVT requiring adenosine, on pitocin  Labor: Progressing on Pitocin, will continue to increase then AROM Preeclampsia:  no signs or symptoms of toxicity Fetal Wellbeing:  Category I Pain Control:  Labor support without medications I/D:  n/a Anticipated MOD:  NSVD  Jackie Plum, MD 10/06/2022, 6:20 AM

## 2022-10-06 NOTE — Lactation Note (Signed)
This note was copied from a baby's chart. Lactation Consultation Note  Patient Name: Boy Sharissa Brierley ZOXWR'U Date: 10/06/2022 Age:22 hours Reason for consult: Initial assessment;Term  [facility] student visited room for initial visit. MOB feeding choice is formula.   Feeding Mother's Current Feeding Choice: Formula Nipple Type: Slow - flow  Consult Status Consult Status: Follow-up Date: 10/07/22 Follow-up type: In-patient    Stpehen Petitjean 10/06/2022, 8:30 PM

## 2022-10-06 NOTE — MAU Note (Signed)
Pt reports chest pressure and SOB have decreased. Heart rate 110-125bpm. Respirations are even and unlabored.

## 2022-10-06 NOTE — Anesthesia Preprocedure Evaluation (Addendum)
Anesthesia Evaluation  Patient identified by MRN, date of birth, ID band Patient awake    Reviewed: Allergy & Precautions, NPO status , Patient's Chart, lab work & pertinent test results  Airway Mallampati: II  TM Distance: >3 FB     Dental no notable dental hx.    Pulmonary neg pulmonary ROS   Pulmonary exam normal        Cardiovascular hypertension,  Rhythm:Regular Rate:Normal     Neuro/Psych   Anxiety Depression    negative neurological ROS     GI/Hepatic Neg liver ROS,GERD  ,,  Endo/Other  negative endocrine ROS    Renal/GU negative Renal ROS  negative genitourinary   Musculoskeletal   Abdominal Normal abdominal exam  (+)   Peds  Hematology  (+) Blood dyscrasia, anemia   Anesthesia Other Findings   Reproductive/Obstetrics                             Anesthesia Physical Anesthesia Plan  ASA: 2  Anesthesia Plan: Epidural   Post-op Pain Management:    Induction:   PONV Risk Score and Plan: 2 and Treatment may vary due to age or medical condition  Airway Management Planned: Natural Airway  Additional Equipment: None  Intra-op Plan:   Post-operative Plan:   Informed Consent: I have reviewed the patients History and Physical, chart, labs and discussed the procedure including the risks, benefits and alternatives for the proposed anesthesia with the patient or authorized representative who has indicated his/her understanding and acceptance.     Dental advisory given  Plan Discussed with:   Anesthesia Plan Comments:        Anesthesia Quick Evaluation

## 2022-10-06 NOTE — Significant Event (Signed)
Rapid Response Event Note   Reason for Call : SVT Initial Focused Assessment:  Notified by nursing staff of pt coming in c/o CP, SOB with HR 170s-190s. Pt treated in route with 500cc bolus. Upon arrival, pt was alert, not in distress, c/p of CP and SOB. She stated she had tightness in her chest. SVT 150-160 confirmed with 12 lead EKG. Valsalva attempted by Dr. Vergie Living. Adenosine  IV rapid push administered. Pt converted to ST 120s.  0115-afebrile, HR 160 SVT, 100/61, RR 18 with sats 100%  After Adenosine, Hr 120 ST, 107/64, RR 17 with sats 100%   Interventions:  -12 lead EKG -Adenosine  IV    MD Notified: Dr. Vergie Living at bedside. Call Time: 0029 Arrival Time: 0042 End Time: 0130  Rose Fillers, RN

## 2022-10-06 NOTE — Lactation Note (Signed)
This note was copied from a baby's chart. Lactation Consultation Note  Patient Name: Alexis [name] [id number] Date: 10/06/2022 Age:22 hours Reason for consult: L&D Initial assessment  Baby was latched when [facility] entered room with lip flanged.  Mother did not breastfeed her twins that were born early.  Baby unlatched during consult and LC assisted with guiding baby deep on breast with intermittent swallows. Lactation to follow up on MBU.   Feeding Mother's Current Feeding Choice: Breast Milk  LATCH Score Latch: Grasps breast easily, tongue down, lips flanged, rhythmical sucking.  Audible Swallowing: A few with stimulation  Type of Nipple: Everted at rest and after stimulation  Comfort (Breast/Nipple): Soft / non-tender  Hold (Positioning): Assistance needed to correctly position infant at breast and maintain latch.  LATCH Score: 8 Interventions Interventions: Education;Assisted with latch   Consult Status Consult Status: Follow-up from L&D    Dahlia Byes [facility] 10/06/2022, 1:09 PM

## 2022-10-06 NOTE — MAU Note (Signed)
.  Alexis [name] is a 22 y.o. at [redacted]w[redacted]d received by EMS in MAU reporting: Feeling heart racing suddenly at 2230-while having labor contractions.Pt reports pressure in chest that increased with minimal exertion. Does not radiate or extend. Reports SOB while at rest. And feeling lightheaded and dizzy with/and without exertion. Pt is currently alert and oriented; not diaphoretic and was treated for nausea enroute via EMS - 4 mg Zofran. 18g NS loc in LAC Present in room OB RR RN                           Adult RR RN                           [facility]                           Dorathy Daft CNM                           Dr.Pickens                           Darlina Rumpf Twitty-charge RN  Labor evaluation: regular pain contractions- pt rates pain at 8 on 0-10 scale. Lower abdomen that raps around to lower back.  Denies SROM Endorses + fetal movement.  Reports single episode of bloody show this am and was seen and discharged from MAU as labor eval.  0054-RR RN-having patient breathe into straw. Telemetry monitor applied per zoll/ LD crash cart.  0101- EKG per RR RN  0108 Valsalva x2 without change in hear rate  Adenosine ordered per Dr.Pickens  0115  Adenosine given IVP - see RR RN note  Onset of complaint: 2230  Pain score: 8 lower abdomen radiating to lower back                     8 chest  Vitals:   10/06/22 0121 10/06/22 0125  BP: 117/73 107/64  Pulse: (!) 114 (!) 120  Resp:    SpO2:       FHT:138bpm Lab orders placed from triage:  EKG                                              Telemetry monitor                                           MAU standing labor

## 2022-10-06 NOTE — H&P (Signed)
Alexis [name] is a 22 y.o. female G2P0102 at [redacted]w[redacted]d presenting for latent labor and chest discomfort. Patient has h/o sinus tachycardia requiring no mediation. Pt. Arrived via EMS and dx with SVT. S/p rapid response evaluation and management. S/p IVF bolus, followed by valsalva x 2 with no change in HR, then Adenosine  x 1 given and maternal HR to the 70s and then 120s. FHT category 1 throughout.   Pregnancy Hx: History of postpartum hemorrhage History of pre-eclampsia Sinus tachycardia Vitamin D deficiency  OB History     Gravida  2   Para  1   Term  0   Preterm  1   AB  0   Living  2      SAB  0   IAB  0   Ectopic  0   Multiple  1   Live Births  2          Past Medical History:  Diagnosis Date   Anemia    Anxiety    COVID-19    Depression    GERD (gastroesophageal reflux disease)    History of postpartum hemorrhage, currently pregnant    History of pre-eclampsia    Tachycardia    no medications-intermittent   Past Surgical History:  Procedure Laterality Date   NO PAST SURGERIES     Family History: family history includes Arthritis in her father; Diabetes in her mother; Diabetes Mellitus II in her mother; Hypertension in her father and mother; Stomach cancer in her maternal aunt and paternal grandmother. Social History:  reports that she has never smoked. She has never used smokeless tobacco. She reports that she does not currently use alcohol. She reports that she does not currently use drugs.     Maternal Diabetes: No Genetic Screening: Normal, low risk female, horizon negative Maternal Ultrasounds/Referrals: Normal Fetal Ultrasounds or other Referrals:  None Maternal Substance Abuse:  No Significant Maternal Medications:  None Significant Maternal Lab Results:  Group B Strep negative Number of Prenatal Visits:greater than 3 verified prenatal visits Other Comments:  None  Review of Systems  Constitutional: Negative.   HENT:  Negative.    Eyes: Negative.   Respiratory:  Positive for shortness of breath.   Cardiovascular:  Positive for palpitations.  Gastrointestinal: Negative.   Genitourinary:  Positive for vaginal bleeding.  Neurological: Negative.   Psychiatric/Behavioral: Negative.     Maternal Medical History:  Reason for admission: Contractions.   Contractions: Onset was yesterday.   Frequency: irregular.   Duration is approximately 1 minute.   Perceived severity is strong.   Fetal activity: Perceived fetal activity is normal.     Dilation: 2.5 Effacement (%): 80 Station: -2 Exam by:: [employer] RN Blood pressure 107/64, pulse (!) 120, resp. rate 17, last menstrual period 12/30/2021, SpO2 100 %, not currently breastfeeding. Maternal Exam:  Uterine Assessment: Contraction strength is firm.  Contraction duration is 1 minute. Contraction frequency is irregular.  Abdomen: Patient reports no abdominal tenderness. Introitus: Normal vulva. Pelvis: adequate for delivery.   Cervix: Cervix evaluated by digital exam.   2.5/80/-2, vertex, bloody show per MAU provider  Fetal Exam Fetal Monitor Review: Baseline rate: 135.  Variability: moderate (6-25 bpm).   Pattern: accelerations present and no decelerations.   Fetal State Assessment: Category I - tracings are normal.   Physical Exam Constitutional:      Appearance: Normal appearance.  Cardiovascular:     Rate and Rhythm: Tachycardia present.  Pulmonary:     Effort:  Pulmonary effort is normal. No respiratory distress.  Abdominal:     General: There is no distension.     Palpations: Abdomen is soft.     Tenderness: There is no abdominal tenderness.  Genitourinary:    General: Normal vulva.  Neurological:     General: No focal deficit present.     Mental Status: She is alert and oriented to person, place, and time.  Psychiatric:        Mood and Affect: Mood normal.     Prenatal labs: ABO, Rh: --/--/O POS (08/23 1209) Antibody:    negative Rubella:   immune RPR: Non Reactive (08/03 0915)  HBsAg: Negative (08/03 0915)  HIV: Non Reactive (08/23 1209)  GBS:   negative  Assessment/Plan: 22 y.o. female G2P0102 at [redacted]w[redacted]d Prolonged latent labor SVT - tx adenosine 6 mg IV History of postpartum hemorrhage History of pre-eclampsia Sinus tachycardia Vitamin D deficiency   Admit to labor and delivery General admission labs CLD EFM/TOCO Continuous Tele TSH/T4 Metoprolol 5 mg IV q6hr PRN HR>150 bpm Labor augmentation with pitocin Early epidural  Que Meneely D Pinkney Venard 10/06/2022, 2:14 AM

## 2022-10-07 LAB — CBC
HCT: 30.5 % — ABNORMAL LOW (ref 36.0–46.0)
Hemoglobin: 10.3 g/dL — ABNORMAL LOW (ref 12.0–15.0)
MCH: 32.1 pg (ref 26.0–34.0)
MCHC: 33.8 g/dL (ref 30.0–36.0)
MCV: 95 fL (ref 80.0–100.0)
Platelets: 215 10*3/uL (ref 150–400)
RBC: 3.21 MIL/uL — ABNORMAL LOW (ref 3.87–5.11)
RDW: 13.9 % (ref 11.5–15.5)
WBC: 6.6 10*3/uL (ref 4.0–10.5)
nRBC: 0 % (ref 0.0–0.2)

## 2022-10-07 LAB — T4: T4, Total: 8 ug/dL (ref 4.5–12.0)

## 2022-10-07 NOTE — Progress Notes (Signed)
CSW received consult for hx of Anxiety and Depression.  CSW met with MOB to offer support and complete assessment. MOB was on the phone and granted CSW verbal permission to speak about anything while she was on the phone. CSW introduced self and explained reason for consult. MOB was pleasant and remained engaged during assessment. CSW and MOB discussed MOB's mental health history. MOB reported that she was diagnosed with anxiety in 2019 and could not recall when she was diagnosed with depression. MOB shared that she has more anxiety than depression and denied any current symptoms of either diagnosis. MOB reported that she is not taking any medication nor participating in therapy to treat mental health diagnoses. MOB denied needing any therapy resources.   FOB entered the room. MOB granted CSW verbal permission to speak in front of FOB about anything.   MOB reported that she experienced postpartum anxiety that lasted a few weeks. MOB described her postpartum anxiety as being anxious over little things in regards to her twins. MOB attributed her anxiety to adjusting to motherhood. MOB reported that she did not receive any treatment for postpartum anxiety and noted that the symptoms subsided on their own. MOB denied any additional mental health history. CSW inquired about how MOB was feeling emotionally since giving birth, MOB reported that she was feeling good just in physical pain. MOB presented calm and did not demonstrate any acute mental health signs/symptoms. CSW assessed for safety, MOB denied SI and HI. CSW did not assess for domestic violence as FOB was present. CSW inquired about MOB's support system, MOB reported that her mom, family, and FOB are supports.   CSW provided education regarding the baby blues period vs. perinatal mood disorders, discussed treatment and gave resources for mental health follow up if concerns arise.  CSW recommends self-evaluation during the postpartum time period using the  New Mom Checklist from Postpartum Progress and encouraged MOB to contact a medical professional if symptoms are noted at any time.    CSW provided review of Sudden Infant Death Syndrome (SIDS) precautions. MOB verbalized understanding and reported having all items needed to care for infant including a car seat and bassinet.   CSW identifies no further need for intervention and no barriers to discharge at this time.   Celso Sickle, LCSW Clinical Social Worker [facility] Cell#: [phone]

## 2022-10-07 NOTE — Anesthesia Postprocedure Evaluation (Signed)
Anesthesia Post Note  Patient: Alexis [name]  Procedure(s) Performed: AN AD HOC LABOR EPIDURAL     Patient location during evaluation: Mother Baby Anesthesia Type: Epidural Level of consciousness: awake and alert Pain management: pain level controlled Vital Signs Assessment: post-procedure vital signs reviewed and stable Respiratory status: spontaneous breathing, nonlabored ventilation and respiratory function stable Cardiovascular status: stable Postop Assessment: no headache, no backache and epidural receding Anesthetic complications: no   No notable events documented.  Last Vitals:  Vitals:   10/07/22 0802 10/07/22 1227  BP: 113/77 113/73  Pulse: 72 79  Resp: 17 17  Temp: 36.5 C 36.7 C  SpO2: 99% 99%    Last Pain:  Vitals:   10/07/22 1227  TempSrc: Oral  PainSc:    Pain Goal: Patients Stated Pain Goal: 3 (10/06/22 1455)                 Amelio Brosky

## 2022-10-08 LAB — SURGICAL PATHOLOGY

## 2022-10-08 MED ORDER — DOCUSATE SODIUM 100 MG PO CAPS: 100.0000 mg | ORAL_CAPSULE | Freq: Two times a day (BID) | ORAL | 0 refills | Status: AC

## 2022-10-08 MED ORDER — IBUPROFEN 600 MG PO TABS: 600.0000 mg | ORAL_TABLET | Freq: Four times a day (QID) | ORAL | 0 refills | Status: DC

## 2022-10-08 NOTE — Plan of Care (Signed)
  Problem: Health Behavior/Discharge Planning: Goal: Ability to manage health-related needs will improve Outcome: Adequate for Discharge   Problem: Clinical Measurements: Goal: Ability to maintain clinical measurements within normal limits will improve Outcome: Adequate for Discharge Goal: Will remain free from infection Outcome: Adequate for Discharge Goal: Diagnostic test results will improve Outcome: Adequate for Discharge Goal: Respiratory complications will improve Outcome: Adequate for Discharge Goal: Cardiovascular complication will be avoided Outcome: Adequate for Discharge   Problem: Activity: Goal: Risk for activity intolerance will decrease Outcome: Adequate for Discharge   Problem: Nutrition: Goal: Adequate nutrition will be maintained Outcome: Adequate for Discharge   Problem: Coping: Goal: Level of anxiety will decrease Outcome: Adequate for Discharge   Problem: Elimination: Goal: Will not experience complications related to bowel motility Outcome: Adequate for Discharge Goal: Will not experience complications related to urinary retention Outcome: Adequate for Discharge   Problem: Pain Managment: Goal: General experience of comfort will improve Outcome: Adequate for Discharge   Problem: Safety: Goal: Ability to remain free from injury will improve Outcome: Adequate for Discharge   Problem: Skin Integrity: Goal: Risk for impaired skin integrity will decrease Outcome: Adequate for Discharge   Problem: Education: Goal: Knowledge of condition will improve Outcome: Adequate for Discharge Goal: Individualized Educational Video(s) Outcome: Adequate for Discharge Goal: Individualized Newborn Educational Video(s) Outcome: Adequate for Discharge   Problem: Activity: Goal: Will verbalize the importance of balancing activity with adequate rest periods Outcome: Adequate for Discharge Goal: Ability to tolerate increased activity will improve Outcome: Adequate  for Discharge   Problem: Coping: Goal: Ability to identify and utilize available resources and services will improve Outcome: Adequate for Discharge   Problem: Life Cycle: Goal: Chance of risk for complications during the postpartum period will decrease Outcome: Adequate for Discharge   Problem: Role Relationship: Goal: Ability to demonstrate positive interaction with newborn will improve Outcome: Adequate for Discharge   Problem: Skin Integrity: Goal: Demonstration of wound healing without infection will improve Outcome: Adequate for Discharge   

## 2022-10-08 NOTE — Discharge Summary (Signed)
OB Discharge Summary     Patient Name: Alexis [name] DOB: 2001-06-12 MRN: [id number]  Date of admission: 10/06/2022 Delivering MD: Hoover Browns   Date of discharge: 10/08/2022  Admitting diagnosis: Prolonged latent phase of labor [O63.0] Intrauterine pregnancy: [redacted]w[redacted]d     Secondary diagnosis:  Principal Problem:   Prolonged latent phase of labor Active Problems:   SVT (supraventricular tachycardia)   SVD (spontaneous vaginal delivery)   Normal postpartum course  Additional problems: None     Discharge diagnosis: Term Pregnancy Delivered                                                                                                Post partum procedures: None  Augmentation: AROM and Pitocin  Complications: None  Hospital course:  Induction of Labor With Vaginal Delivery   22 y.o. yo [id number] at [redacted]w[redacted]d was admitted to the hospital 10/06/2022 for induction of labor.  Indication for induction:  SVT and prolonged latent labor .  Patient had an labor course complicated by none Membrane Rupture Time/Date: 11:52 AM ,10/06/2022   Delivery Method:Vaginal, Spontaneous  Episiotomy: None  Lacerations:  None  Details of delivery can be found in separate delivery note.  Patient had a postpartum course complicated by none. Patient is discharged home 10/08/22.  Newborn Data: Birth date:10/06/2022  Birth time:12:02 PM  Gender:Female  Living status:Living  Apgars:3 ,8  Weight:3240 g   Physical exam  Vitals:   10/07/22 1609 10/07/22 2018 10/08/22 0607 10/08/22 0932  BP: 118/76 118/75 117/82 127/78  Pulse: 73 75 68 80  Resp: Temp: 98.1 F (36.7 C) 97.7 F (36.5 C) (!) 97.5 F (36.4 C) 97.7 F (36.5 C)  TempSrc: Oral Oral Oral Oral  SpO2: 98% 100% 100% 100%  Weight:      Height:       General: alert, cooperative, and no distress Lochia: appropriate Uterine Fundus: firm Incision: N/A DVT Evaluation: No evidence of DVT seen on physical exam. Calf/Ankle edema  is present Labs: Lab Results  Component Value Date   WBC 6.6 10/07/2022   HGB 10.3 (L) 10/07/2022   HCT 30.5 (L) 10/07/2022   MCV 95.0 10/07/2022   PLT 215 10/07/2022      Latest Ref Rng & Units 10/06/2022    2:35 AM  CMP  Glucose 70 - 99 mg/dL 90   BUN 6 - 20 mg/dL 7   Creatinine 1.30 - 8.65 mg/dL 7.84   Sodium 696 - 295 mmol/L 137   Potassium 3.5 - 5.1 mmol/L 3.4   Chloride 98 - 111 mmol/L 109   CO2 22 - 32 mmol/L 21   Calcium 8.9 - 10.3 mg/dL 7.9   Total Protein 6.5 - 8.1 g/dL 5.9   Total Bilirubin 0.3 - 1.2 mg/dL 0.8   Alkaline Phos 38 - 126 U/L 91   AST 15 - 41 U/L 23   ALT 0 - 44 U/L 17     Discharge instruction: per After Visit Summary and "Baby and Me Booklet".  After visit meds:  Allergies as of 10/08/2022  Reactions   Dust Mite Extract Hives   Mixed Ragweed Hives   Venofer [iron Sucrose] Other (See Comments)   Chest pain        Medication List     STOP taking these medications    aspirin 81 MG chewable tablet   Doxylamine-Pyridoxine 10-10 MG Tbec Commonly known as: Diclegis       TAKE these medications    albuterol 108 (90 Base) MCG/ACT inhaler Commonly known as: VENTOLIN HFA Inhale 1-2 puffs into the lungs every 6 (six) hours as needed for wheezing or shortness of breath.   cetirizine 10 MG tablet Commonly known as: ZYRTEC Take 10 mg by mouth daily.   docusate sodium 100 MG capsule Commonly known as: Colace Take 1 capsule (100 mg total) by mouth 2 (two) times daily for 10 days.   ibuprofen 600 MG tablet Commonly known as: ADVIL Take 1 tablet (600 mg total) by mouth every 6 (six) hours.   Iron 325 (65 Fe) MG Tabs Take 325 mg by mouth in the morning and at bedtime.   prenatal multivitamin Tabs tablet Take 1 tablet by mouth daily at 12 noon.        Diet: routine diet  Activity: Advance as tolerated. Pelvic rest for 6 weeks.   Outpatient follow up:6 weeks Follow up Appt:No future appointments. Follow up Visit:No  follow-ups on file.  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   10/08/2022 Jackie Plum, MD

## 2022-10-08 NOTE — Progress Notes (Signed)
   10/08/22 1252  Departure Condition  Departure Condition Good  Mobility at [facility]  Patient/Caregiver Teaching Teach Back Method Used;Discharge instructions reviewed;Prescriptions reviewed;Follow-up care reviewed;Pain management discussed;Medications discussed;Patient/caregiver verbalized understanding;Educated about hypertension in pregnancy  Departure Mode With significant other  Was procedural sedation performed on this patient during this visit? No   Patient alert and oriented x4, VS and pain stable.

## 2022-10-15 ENCOUNTER — Inpatient Hospital Stay (HOSPITAL_COMMUNITY)
Admission: RE | Admit: 2022-10-15 | Payer: Medicaid Other | Source: Home / Self Care | Admitting: Obstetrics and Gynecology

## 2022-10-15 ENCOUNTER — Inpatient Hospital Stay (HOSPITAL_COMMUNITY): Payer: Medicaid Other

## 2022-11-15 ENCOUNTER — Ambulatory Visit
Admission: EM | Admit: 2022-11-15 | Discharge: 2022-11-15 | Disposition: A | Discharge status: Home / Self Care | Payer: Medicaid Other | Attending: Nurse Practitioner | Admitting: Nurse Practitioner

## 2022-11-15 DIAGNOSIS — B9689 Other specified bacterial agents as the cause of diseases classified elsewhere: Secondary | ICD-10-CM

## 2022-11-15 DIAGNOSIS — J208 Acute bronchitis due to other specified organisms: Secondary | ICD-10-CM | POA: Diagnosis not present

## 2022-11-15 LAB — POCT RAPID STREP A (OFFICE): Rapid Strep A Screen: NEGATIVE

## 2022-11-15 MED ORDER — ALBUTEROL SULFATE HFA 108 (90 BASE) MCG/ACT IN AERS
1.0000 | INHALATION_SPRAY | Freq: Four times a day (QID) | RESPIRATORY_TRACT | 0 refills | Status: DC | PRN
Start: 2022-11-15 — End: 2023-02-18

## 2022-11-15 MED ORDER — AMOXICILLIN-POT CLAVULANATE 875-125 MG PO TABS
1.0000 | ORAL_TABLET | Freq: Two times a day (BID) | ORAL | 0 refills | Status: DC
Start: 2022-11-15 — End: 2022-12-18

## 2022-11-15 MED ORDER — PROMETHAZINE-DM 6.25-15 MG/5ML PO SYRP
5.0000 mL | ORAL_SOLUTION | Freq: Four times a day (QID) | ORAL | 0 refills | Status: DC | PRN
Start: 2022-11-15 — End: 2022-11-26

## 2022-11-15 NOTE — ED Triage Notes (Addendum)
Pt presents to UC w/ c/o productive cough, chest congestion, sore throat, bilateral ear pain, fever,  x1 week.

## 2022-11-15 NOTE — Discharge Instructions (Signed)
Start Augmentin twice daily for 7 days Albuterol inhaler as needed for shortness of breath or wheezing Promethazine DM as needed for cough.  Please of this medication can make you drowsy.  Do not drink alcohol or drive while on this medication Rest and fluids Follow-up with your PCP if your symptoms do not improve Please go to the ER for any worsening symptoms

## 2022-11-15 NOTE — ED Provider Notes (Signed)
[facility]    CSN: [id number] Arrival date & time: 11/15/22  1033      History   Chief Complaint No chief complaint on file.   HPI Alexis [name] is a 22 y.o. female  presents for evaluation of URI symptoms for 7 days. Patient reports associated symptoms of a worsening cough with purulent sputum, congestion, sore throat, ear pain, and reported fever of 103 on day 1 of symptoms. Denies N/V/D, no fevers after day 1, body aches, shortness of breath.  She does states she feels some wheezing intermittently.  Patient does not have a hx of asthma or smoking.  States mother has similar symptoms.  Pt has taken DayQuil/NyQuil OTC for symptoms. Pt has no other concerns at this time.   HPI  Past Medical History:  Diagnosis Date   Anemia    Anxiety    COVID-19    Depression    GERD (gastroesophageal reflux disease)    History of postpartum hemorrhage, currently pregnant    History of pre-eclampsia    Tachycardia    no medications-intermittent    Patient Active Problem List   Diagnosis Date Noted   SVT (supraventricular tachycardia) 10/06/2022   Prolonged latent phase of labor 10/06/2022   SVD (spontaneous vaginal delivery) 10/06/2022   Normal postpartum course 10/06/2022   COVID-19    Anxiety    Chronic hypertension 08/27/2020   GERD (gastroesophageal reflux disease)     Past Surgical History:  Procedure Laterality Date   NO PAST SURGERIES      OB History     Gravida  2   Para  2   Term  1   Preterm  1   AB  0   Living  3      SAB  0   IAB  0   Ectopic  0   Multiple  1   Live Births  3            Home Medications    Prior to Admission medications   Medication Sig Start Date End Date Taking? Authorizing Provider  albuterol (VENTOLIN HFA) 108 (90 Base) MCG/ACT inhaler Inhale 1-2 puffs into the lungs every 6 (six) hours as needed for wheezing or shortness of breath. 11/15/22  Yes Radford Pax, NP  amoxicillin-clavulanate  (AUGMENTIN) 875-125 MG tablet Take 1 tablet by mouth every 12 (twelve) hours. 11/15/22  Yes Radford Pax, NP  promethazine-dextromethorphan (PROMETHAZINE-DM) 6.25-15 MG/5ML syrup Take 5 mLs by mouth 4 (four) times daily as needed for cough. 11/15/22  Yes Radford Pax, NP  cetirizine (ZYRTEC) 10 MG tablet Take 10 mg by mouth daily.    [provider]  Ferrous Sulfate (IRON) 325 (65 Fe) MG TABS Take 325 mg by mouth in the morning and at bedtime.    [provider]  ibuprofen (ADVIL) 600 MG tablet Take 1 tablet (600 mg total) by mouth every 6 (six) hours. 10/08/22   Jackie Plum, MD  Prenatal Vit-Fe Fumarate-FA (PRENATAL MULTIVITAMIN) TABS tablet Take 1 tablet by mouth daily at 12 noon.    [provider]    Family History Family History  Problem Relation Age of Onset   Diabetes Mother    Diabetes Mellitus II Mother    Hypertension Mother    Arthritis Father    Hypertension Father    Stomach cancer Maternal Aunt    Stomach cancer Paternal Grandmother     Social History Social History   Tobacco Use  Smoking status: Never   Smokeless tobacco: Never  Vaping Use   Vaping Use: Never used  Substance Use Topics   Alcohol use: Not Currently   Drug use: Not Currently     Allergies   Dust mite extract, Mixed ragweed, and Venofer [iron sucrose]   Review of Systems Review of Systems  Constitutional:  Positive for fever.  HENT:  Positive for congestion and sore throat.   Respiratory:  Positive for cough.      Physical Exam Triage Vital Signs ED Triage Vitals  Enc Vitals Group     BP 11/15/22 1158 (!) 143/90     Pulse Rate 11/15/22 1158 83     Resp 11/15/22 1158 16     Temp 11/15/22 1158 98.8 F (37.1 C)     Temp Source 11/15/22 1158 Oral     SpO2 11/15/22 1158 98 %     Weight --      Height --      Head Circumference --      Peak Flow --      Pain Score 11/15/22 1202 3     Pain Loc --      Pain Edu? --      Excl. in GC? --    No data  found.  Updated Vital Signs BP (!) 143/90 (BP Location: Right Arm)   Pulse 83   Temp 98.8 F (37.1 C) (Oral)   Resp 16   LMP 11/09/2022 (Approximate)   SpO2 98%   Visual Acuity Right Eye Distance:   Left Eye Distance:   Bilateral Distance:    Right Eye Near:   Left Eye Near:    Bilateral Near:     Physical Exam Vitals and nursing note reviewed.  Constitutional:      General: She is not in acute distress.    Appearance: She is well-developed. She is not ill-appearing.  HENT:     Head: Normocephalic and atraumatic.     Right Ear: Tympanic membrane and ear canal normal.     Left Ear: Tympanic membrane and ear canal normal.     Nose: Congestion present.     Mouth/Throat:     Mouth: Mucous membranes are moist.     Pharynx: Oropharynx is clear. Uvula midline. Posterior oropharyngeal erythema present.     Tonsils: No tonsillar exudate or tonsillar abscesses.  Eyes:     Conjunctiva/sclera: Conjunctivae normal.     Pupils: Pupils are equal, round, and reactive to light.  Cardiovascular:     Rate and Rhythm: Normal rate and regular rhythm.     Heart sounds: Normal heart sounds.  Pulmonary:     Effort: Pulmonary effort is normal.     Breath sounds: Normal breath sounds.  Musculoskeletal:     Cervical back: Normal range of motion and neck supple.  Lymphadenopathy:     Cervical: No cervical adenopathy.  Skin:    General: Skin is warm and dry.  Neurological:     General: No focal deficit present.     Mental Status: She is alert and oriented to person, place, and time.  Psychiatric:        Mood and Affect: Mood normal.        Behavior: Behavior normal.      UC Treatments / Results  Labs (all labs ordered are listed, but only abnormal results are displayed) Labs Reviewed  POCT RAPID STREP A (OFFICE)   Comprehensive metabolic panel Order: [id number] Status: Final result  Visible to patient: Yes (not seen)     Next appt: 12/12/2022 at 02:20 PM in Cardiology  Lavona Mound Tobb, DO)   0 Result Notes          Component Ref Range & Units 1 mo ago (10/06/22) 1 mo ago (09/29/22) 1 mo ago (09/21/22) 2 mo ago (09/15/22) 1 yr ago (11/30/20) 1 yr ago (11/21/20) 2 yr ago (11/01/20)  Sodium 135 - 145 mmol/L 137 136 137 134 Low  140 137 134 Low   Potassium 3.5 - 5.1 mmol/L 3.4 Low  3.7 3.8 3.6 4.1 4.0 3.7  Chloride 98 - 111 mmol/L 109 107 107 107 110 106 109  CO2 22 - 32 mmol/L 21 Low  21 Low  19 Low  20 Low  25 24 20  Low   Glucose, Bld 70 - 99 mg/dL 90 161 High  CM 81 CM 91 CM 104 High  CM 92 CM 107 High  CM  Comment: Glucose reference range applies only to samples taken after fasting for at least 8 hours.  BUN 6 - 20 mg/dL 7 5 Low  <5 Low  5 Low  5 Low  7 <5 Low   Creatinine, Ser 0.44 - 1.00 mg/dL 0.96 0.45 4.09 8.11 9.14 0.80 0.66  Calcium 8.9 - 10.3 mg/dL 7.9 Low  8.7 Low  8.4 Low  8.4 Low  8.9 9.0 8.0 Low   Total Protein 6.5 - 8.1 g/dL 5.9 Low  6.2 Low  6.3 Low  6.1 Low  6.7 6.8 5.9 Low   Albumin 3.5 - 5.0 g/dL 2.6 Low  2.8 Low  2.8 Low  2.8 Low  3.8 3.8 2.9 Low   AST 15 - 41 U/L 23 18 15 18 21 19 24   ALT 0 - 44 U/L 17 16 14 13 14 16 14   Alkaline Phosphatase 38 - 126 U/L 91 97 88 88 68 76 142 High   Total Bilirubin 0.3 - 1.2 mg/dL 0.8 0.4 0.2 Low  0.3 0.5 0.5 0.4  GFR, Estimated >60 mL/min >60 >60 CM >60 CM >60 CM >60 CM >60 CM >60 CM  Comment: (NOTE) Calculated using the CKD-EPI Creatinine Equation (2021)  Anion gap 5 - 15 7 8  CM 11 CM 7 CM 5 CM 7 CM 5 CM  Comment: Performed at [facility], [address], [place], [place] [address]  Resulting Agency [facility]                EKG   Radiology No results found.  Procedures Procedures (including critical care time)  Medications Ordered in UC Medications - No data to display  Initial Impression / Assessment and Plan / UC Course  I have reviewed the triage vital signs and the nursing  notes.  Pertinent labs & imaging results that were available during my care of the patient were reviewed by me and considered in my medical decision making (see chart for details).     Negative rapid strep.  Will start Augmentin for bronchitis given progression and length of symptoms Albuterol inhaler as needed Promethazine DM as needed for cough.  Side effect profile reviewed Rest and fluids PCP follow-up if symptoms do not improve ER precautions reviewed and patient verbalized understanding Final Clinical Impressions(s) / UC Diagnoses   Final diagnoses:  Acute bacterial bronchitis     Discharge Instructions  Start Augmentin twice daily for 7 days Albuterol inhaler as needed for shortness of breath or wheezing Promethazine DM as needed for cough.  Please of this medication can make you drowsy.  Do not drink alcohol or drive while on this medication Rest and fluids Follow-up with your PCP if your symptoms do not improve Please go to the ER for any worsening symptoms    ED Prescriptions     Medication Sig Dispense Auth. Provider   promethazine-dextromethorphan (PROMETHAZINE-DM) 6.25-15 MG/5ML syrup Take 5 mLs by mouth 4 (four) times daily as needed for cough. 118 mL Radford Pax, NP   albuterol (VENTOLIN HFA) 108 (90 Base) MCG/ACT inhaler Inhale 1-2 puffs into the lungs every 6 (six) hours as needed for wheezing or shortness of breath. 1 each Radford Pax, NP   amoxicillin-clavulanate (AUGMENTIN) 875-125 MG tablet Take 1 tablet by mouth every 12 (twelve) hours. 14 tablet Radford Pax, NP      PDMP not reviewed this encounter.   Radford Pax, NP 11/15/22 1215

## 2022-11-26 ENCOUNTER — Ambulatory Visit (INDEPENDENT_AMBULATORY_CARE_PROVIDER_SITE_OTHER): Payer: Medicaid Other

## 2022-11-26 ENCOUNTER — Ambulatory Visit
Admission: EM | Admit: 2022-11-26 | Discharge: 2022-11-26 | Disposition: A | Discharge status: Home / Self Care | Payer: Medicaid Other

## 2022-11-26 DIAGNOSIS — J189 Pneumonia, unspecified organism: Secondary | ICD-10-CM | POA: Diagnosis not present

## 2022-11-26 DIAGNOSIS — J209 Acute bronchitis, unspecified: Secondary | ICD-10-CM | POA: Diagnosis not present

## 2022-11-26 MED ORDER — LEVOFLOXACIN 750 MG PO TABS: 750.0000 mg | ORAL_TABLET | Freq: Every day | ORAL | 0 refills | Status: DC

## 2022-11-26 MED ORDER — PREDNISONE 20 MG PO TABS: ORAL_TABLET | ORAL | 0 refills | Status: DC

## 2022-11-26 MED ORDER — BENZONATATE 100 MG PO CAPS: 100.0000 mg | ORAL_CAPSULE | Freq: Three times a day (TID) | ORAL | 0 refills | Status: DC | PRN

## 2022-11-26 NOTE — ED Triage Notes (Signed)
Pt reports cough x 3 weeks; on and off chills, fever 103.0 F x 3 weeks. Pt was told she has bronchitis 1 week ago,reports she is not feeling better. Mucinex, Robitussin and amoxicillin gives no relief. Pt finished amoxicillin yesterday.

## 2022-11-26 NOTE — Discharge Instructions (Addendum)
I will update you on your chest x-ray results when I get the report from the radiology department. Otherwise, take prednisone for the bronchitis and persistent cough. Use cough capsules as needed.   Please go ahead and start levofloxacin to help with the pneumonia of both sides of your lungs. If you have no improvement and definitely if you get worse please go to the emergency room.  Otherwise, I do recommend having a repeat chest x-ray in 4 to 6 weeks to make sure everything is healed.

## 2022-11-26 NOTE — ED Provider Notes (Addendum)
[name]  Note:  This document was prepared using Conservation officer, historic buildings and may include unintentional dictation errors.  MRN: [id number] DOB: 04-08-2001  Subjective:   Alexis [name] is a 22 y.o. female presenting for 3 week history of acute onset persistent coughing, fever, chills.  Patient was seen 11/15/2022 and prescribed Augmentin to help with acute bacterial bronchitis.  She just finished the antibiotic yesterday and continues to have symptoms.  She is now experiencing chest pain over the mid sternum with her cough.  Continues to use Mucinex and Robitussin but would like to make sure she does not need further treatment.  No history of asthma, respiratory disorders.  No smoking of any kind including cigarettes, cigars, vaping, marijuana use.    No current facility-administered medications for this encounter.  Current Outpatient Medications:    dextromethorphan-guaiFENesin (MUCINEX DM) 30-600 MG 12hr tablet, Take 1 tablet by mouth 2 (two) times daily., Disp: , Rfl:    Pseudoephedrine-DM-GG (ROBITUSSIN CF PO), Take by mouth., Disp: , Rfl:    albuterol (VENTOLIN HFA) 108 (90 Base) MCG/ACT inhaler, Inhale 1-2 puffs into the lungs every 6 (six) hours as needed for wheezing or shortness of breath., Disp: 1 each, Rfl: 0   amoxicillin-clavulanate (AUGMENTIN) 875-125 MG tablet, Take 1 tablet by mouth every 12 (twelve) hours., Disp: 14 tablet, Rfl: 0   cetirizine (ZYRTEC) 10 MG tablet, Take 10 mg by mouth daily., Disp: , Rfl:    Ferrous Sulfate (IRON) 325 (65 Fe) MG TABS, Take 325 mg by mouth in the morning and at bedtime., Disp: , Rfl:    ibuprofen (ADVIL) 600 MG tablet, Take 1 tablet (600 mg total) by mouth every 6 (six) hours., Disp: 30 tablet, Rfl: 0   Prenatal Vit-Fe Fumarate-FA (PRENATAL MULTIVITAMIN) TABS tablet, Take 1 tablet by mouth daily at 12 noon., Disp: , Rfl:    promethazine-dextromethorphan (PROMETHAZINE-DM) 6.25-15 MG/5ML syrup, Take  5 mLs by mouth 4 (four) times daily as needed for cough., Disp: 118 mL, Rfl: 0   Allergies  Allergen Reactions   Dust Mite Extract Hives   Mixed Ragweed Hives   Venofer [Iron Sucrose] Other (See Comments)    Chest pain    Past Medical History:  Diagnosis Date   Anemia    Anxiety    COVID-19    Depression    GERD (gastroesophageal reflux disease)    History of postpartum hemorrhage, currently pregnant    History of pre-eclampsia    Tachycardia    no medications-intermittent     Past Surgical History:  Procedure Laterality Date   NO PAST SURGERIES      Family History  Problem Relation Age of Onset   Diabetes Mother    Diabetes Mellitus II Mother    Hypertension Mother    Arthritis Father    Hypertension Father    Stomach cancer Maternal Aunt    Stomach cancer Paternal Grandmother     Social History   Tobacco Use   Smoking status: Never   Smokeless tobacco: Never  Vaping Use   Vaping Use: Never used  Substance Use Topics   Alcohol use: Not Currently   Drug use: Not Currently    ROS   Objective:   Vitals: BP (!) 139/92 (BP Location: Left Arm)   Pulse 99   Temp 99.4 F (37.4 C) (Oral)   Resp 18   LMP 11/22/2022 (Exact Date)   SpO2 98%   Breastfeeding No   Physical Exam Constitutional:  General: She is not in acute distress.    Appearance: Normal appearance. She is well-developed. She is not ill-appearing, toxic-appearing or diaphoretic.  HENT:     Head: Normocephalic and atraumatic.     Nose: Nose normal.     Mouth/Throat:     Mouth: Mucous membranes are moist.     Pharynx: No pharyngeal swelling, oropharyngeal exudate, posterior oropharyngeal erythema or uvula swelling.     Tonsils: No tonsillar exudate or tonsillar abscesses. 0 on the right. 0 on the left.  Eyes:     General: No scleral icterus.       Right eye: No discharge.        Left eye: No discharge.     Extraocular Movements: Extraocular movements intact.  Cardiovascular:      Rate and Rhythm: Normal rate and regular rhythm.     Heart sounds: Normal heart sounds. No murmur heard.    No friction rub. No gallop.  Pulmonary:     Effort: Pulmonary effort is normal. No respiratory distress.     Breath sounds: No stridor. No wheezing, rhonchi or rales.  Chest:     Chest wall: No tenderness.  Skin:    General: Skin is warm and dry.  Neurological:     General: No focal deficit present.     Mental Status: She is alert and oriented to person, place, and time.  Psychiatric:        Mood and Affect: Mood normal.        Behavior: Behavior normal.        Thought Content: Thought content normal.        Judgment: Judgment normal.     Assessment and Plan :   PDMP not reviewed this encounter.  1. Acute bronchitis, unspecified organism    X-ray over-read was pending at time of discharge, recommended follow up with only abnormal results. Otherwise will not call for negative over-read. Patient was in agreement.  Will update treatment plan following x-ray results. For now, recommended a prednisone course given her history of bronchitis, persistent cough. Will use an antibiotic if her chest x-ray shows pneumonia. Will call patient after I receive the radiology over-read. Counseled patient on potential for adverse effects with medications prescribed/recommended today, ER and return-to-clinic precautions discussed, patient verbalized understanding.    Wallis Bamberg, [place] 11/26/22 1031   DG Chest 2 View  Result Date: 11/26/2022 CLINICAL DATA:  Cough and fever for the past 3 weeks. EXAM: CHEST - 2 VIEW COMPARISON:  Chest x-ray dated January 02, 2022. FINDINGS: The heart size and mediastinal contours are within normal limits. Normal pulmonary vascularity. Patchy consolidation in the lingula at the left lung base. Possible small opacity in the medial right middle lobe as well. No pleural effusion or pneumothorax. No acute osseous abnormality. IMPRESSION: 1. Lingular and possible right  middle lobe pneumonia. Electronically Signed   By: Obie Dredge M.D.   On: 11/26/2022 10:29    1. Pneumonia of both lower lobes due to infectious organism   2. Acute bronchitis, unspecified organism    Will cover for bilateral pneumonia using levofloxacin.  We did discuss the possibility of using azithromycin and cefdinir but for now, will take the route of levofloxacin. Counseled patient on potential for adverse effects with medications prescribed/recommended today, ER and return-to-clinic precautions discussed, patient verbalized understanding.    Wallis Bamberg, PA-C 11/26/22 1047

## 2022-12-12 ENCOUNTER — Ambulatory Visit: Payer: Medicaid Other | Admitting: Cardiology

## 2022-12-15 ENCOUNTER — Ambulatory Visit
Admission: EM | Admit: 2022-12-15 | Discharge: 2022-12-15 | Discharge status: Against Medical Advice | Payer: Medicaid Other

## 2022-12-18 ENCOUNTER — Ambulatory Visit
Admission: EM | Admit: 2022-12-18 | Discharge: 2022-12-18 | Disposition: A | Discharge status: Home / Self Care | Payer: Medicaid Other | Attending: Family Medicine | Admitting: Family Medicine

## 2022-12-18 ENCOUNTER — Ambulatory Visit (INDEPENDENT_AMBULATORY_CARE_PROVIDER_SITE_OTHER): Payer: Medicaid Other

## 2022-12-18 DIAGNOSIS — R051 Acute cough: Secondary | ICD-10-CM

## 2022-12-18 DIAGNOSIS — J069 Acute upper respiratory infection, unspecified: Secondary | ICD-10-CM | POA: Diagnosis not present

## 2022-12-18 DIAGNOSIS — R062 Wheezing: Secondary | ICD-10-CM | POA: Diagnosis not present

## 2022-12-18 MED ORDER — PREDNISONE 20 MG PO TABS: 40.0000 mg | ORAL_TABLET | Freq: Every day | ORAL | 0 refills | Status: DC

## 2022-12-18 MED ORDER — HYDROCODONE BIT-HOMATROP MBR 5-1.5 MG/5ML PO SOLN: 5.0000 mL | Freq: Four times a day (QID) | ORAL | 0 refills | Status: DC | PRN

## 2022-12-18 NOTE — Discharge Instructions (Signed)
Be aware, your cough medication may cause drowsiness. Please do not drive, operate heavy machinery or make important decisions while on this medication, it can cloud your judgement.  

## 2022-12-18 NOTE — ED Provider Notes (Signed)
[facility]ARE [facility]ENTER   [id number] 12/18/22 Arrival Time: 0800  ASSESSMENT & PLAN:  1. Acute cough   2. Wheezing   3. Viral URI with cough    Discussed typical duration of likely viral illness. I have personally viewed and independently interpreted the imaging studies ordered this visit. No acute changes today. Suspected PNA from previous films has resolved.  OTC symptom care as needed.  New Prescriptions   HYDROCODONE BIT-HOMATROPINE (HYCODAN) 5-1.5 MG/5ML SYRUP    Take 5 mLs by mouth every 6 (six) hours as needed for cough.   PREDNISONE (DELTASONE) 20 MG TABLET    Take 2 tablets (40 mg total) by mouth daily.   No resp distress.   Follow-up Information     [facility]).   Specialty: Urgent Care Why: If worsening or failing to improve as anticipated. Contact information: [address] [place] [address] [phone]                Reviewed expectations re: course of current medical issues. Questions answered. Outlined signs and symptoms indicating need for more acute intervention. Understanding verbalized. After Visit Summary given.   SUBJECTIVE: History from: Patient. Alexis [name] is a 22 y.o. female. Pt presents to UC w/ c/o cough, wheezing, green mucus, and congestion x4 days. PNA dx on 11/26/22; finished antibiotics. Was feeling much better until current symptoms started. Sudafed did not help. Denies: fever/SOB. Normal PO intake without n/v/d.  OBJECTIVE:  Vitals:   12/18/22 0810  BP: (!) 141/78  Pulse: 81  Resp: 16  Temp: 98.2 F (36.8 C)  TempSrc: Oral  SpO2: 96%    General appearance: alert; no distress Eyes: PERRLA; EOMI; conjunctiva normal HENT: [place]; AT; with nasal congestion Neck: supple  Lungs: speaks full sentences without difficulty; unlabored; coughing; mild bilat exp wheezing present Extremities: no edema Skin: warm and dry Neurologic: normal  gait Psychological: alert and cooperative; normal mood and affect   Allergies  Allergen Reactions   Dust Mite Extract Hives   Mixed Ragweed Hives   Venofer [Iron Sucrose] Other (See Comments)    Chest pain    Past Medical History:  Diagnosis Date   Anemia    Anxiety    COVID-19    Depression    GERD (gastroesophageal reflux disease)    History of postpartum hemorrhage, currently pregnant    History of pre-eclampsia    Pneumonia    Tachycardia    no medications-intermittent   Social History   Socioeconomic History   Marital status: Single    Spouse name: Not on file   Number of children: Not on file   Years of education: Not on file   Highest education level: Not on file  Occupational History   Not on file  Tobacco Use   Smoking status: Never   Smokeless tobacco: Never  Vaping Use   Vaping Use: Never used  Substance and Sexual Activity   Alcohol use: Not Currently   Drug use: Not Currently   Sexual activity: Yes    Birth control/protection: None  Other Topics Concern   Not on file  Social History Narrative   Not on file   Social Determinants of Health   Financial Resource Strain: Not on file  Food Insecurity: No Food Insecurity (10/06/2022)   Hunger Vital Sign    Worried About Running Out of Food in the Last Year: Never true    Ran Out of Food in  the Last Year: Never true  Transportation Needs: No Transportation Needs (10/06/2022)   PRAPARE - Administrator, Civil Service (Medical): No    Lack of Transportation (Non-Medical): No  Physical Activity: Not on file  Stress: Not on file  Social Connections: Not on file  Intimate Partner Violence: Not At Risk (10/06/2022)   Humiliation, Afraid, Rape, and Kick questionnaire    Fear of Current or Ex-Partner: No    Emotionally Abused: No    Physically Abused: No    Sexually Abused: No   Family History  Problem Relation Age of Onset   Diabetes Mother    Diabetes Mellitus II Mother    Hypertension  Mother    Arthritis Father    Hypertension Father    Stomach cancer Maternal Aunt    Stomach cancer Paternal Grandmother    Past Surgical History:  Procedure Laterality Date   NO PAST SURGERIES       Mardella Layman, MD 12/18/22 [phone]

## 2022-12-18 NOTE — ED Triage Notes (Signed)
Pt presents to UC w/ c/o cough, wheezing, green mucus, and congestion x4 days. Pt states she just got over bilateral pneumonia and finished her antibiotics and her symptoms improved then. Sudafed did not help.

## 2023-01-05 ENCOUNTER — Encounter: Payer: Self-pay | Admitting: Family Medicine

## 2023-01-17 IMAGING — US US MFM OB TRANSVAGINAL
1 series · 14 of 28 positions shown · non-contrast
Comparison: none

[Series 1: us mfm ob transvaginal · 40 acquisitions, 14 frames shown]
[im 2/40]
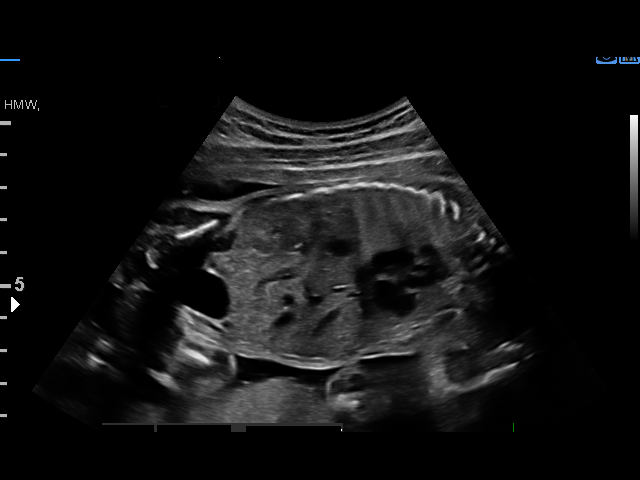
[im 5/40]
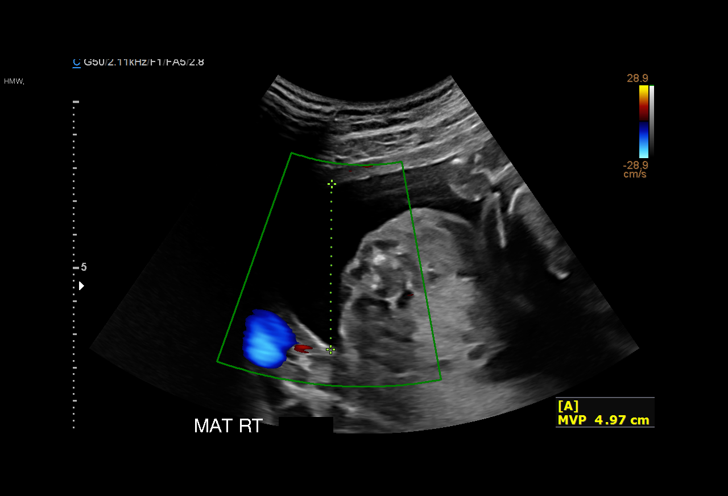
[im 8/40]
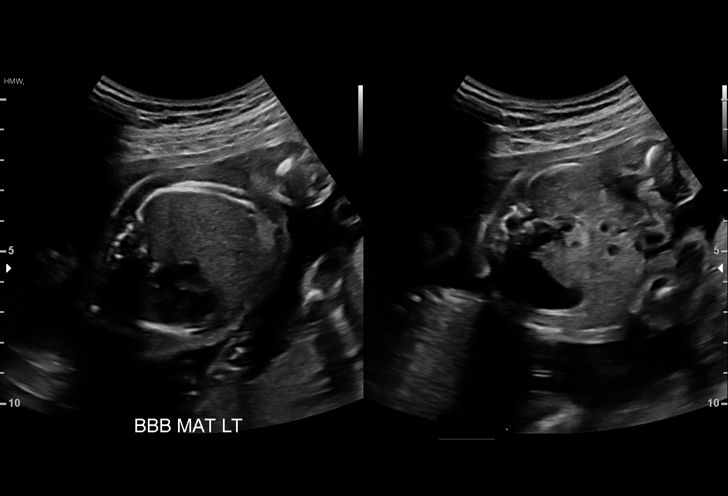
[im 11/40]
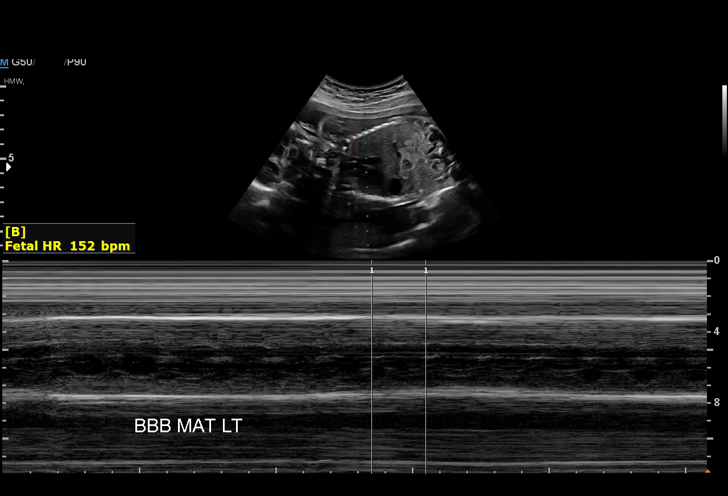
[im 14/40]
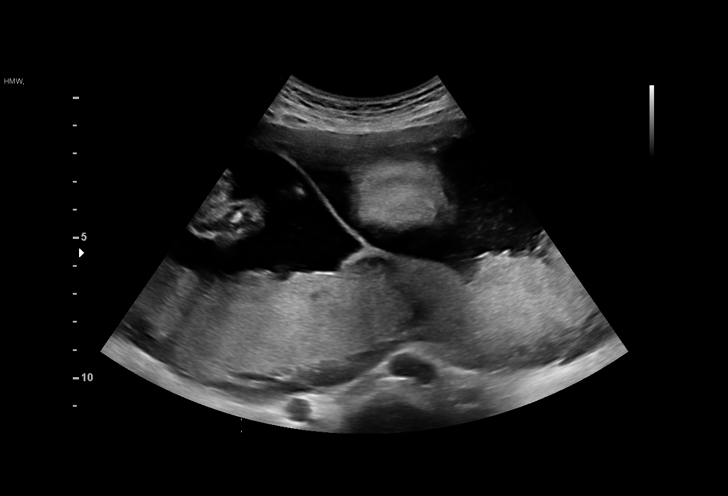
[im 16/40]
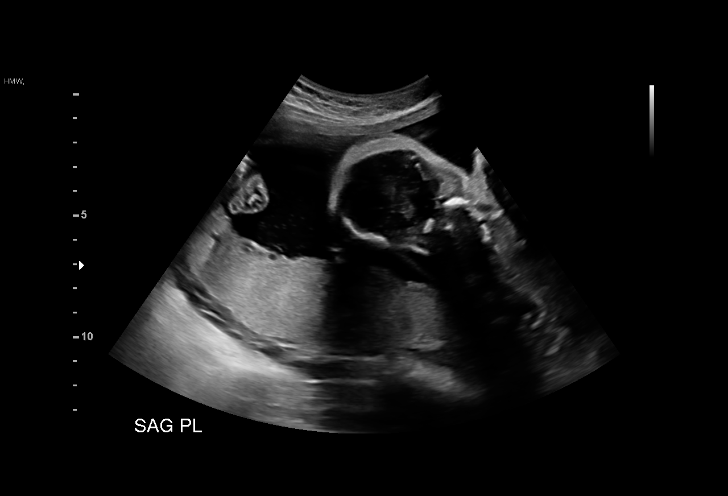
[im 19/40]
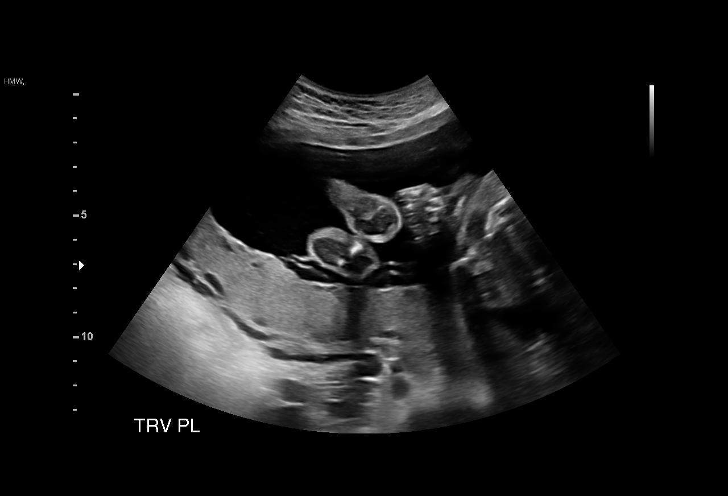
[im 22/40]
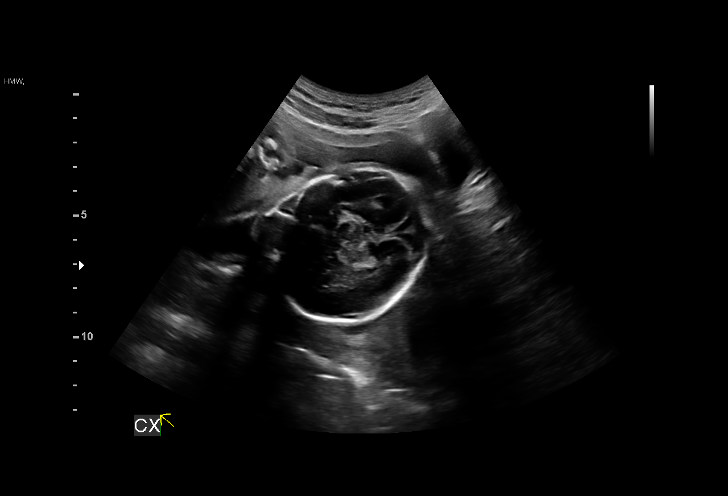
[im 25/40]
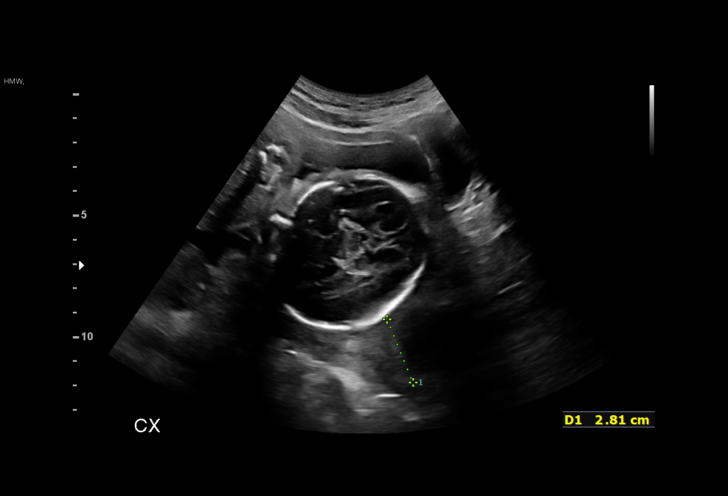
[im 28/40]
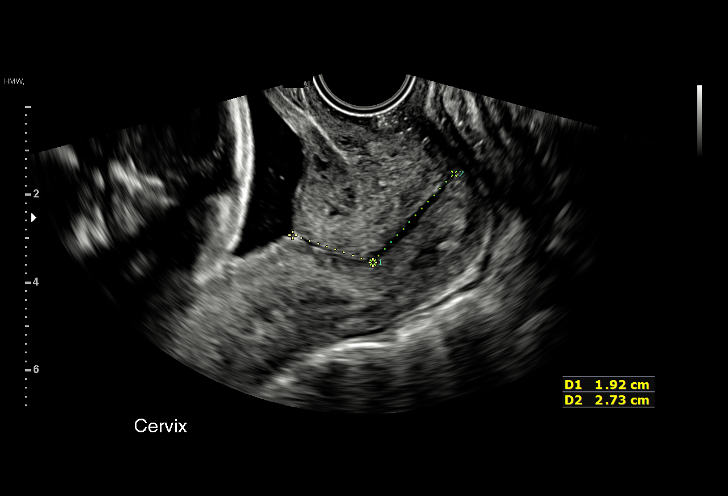
[im 31/40]
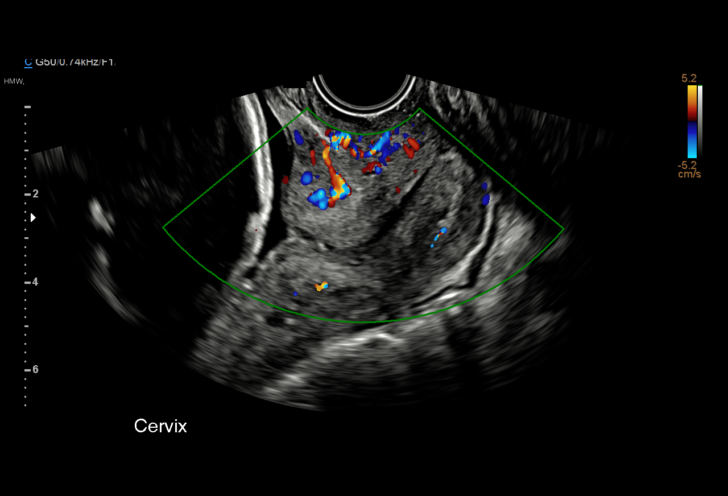
[im 34/40]
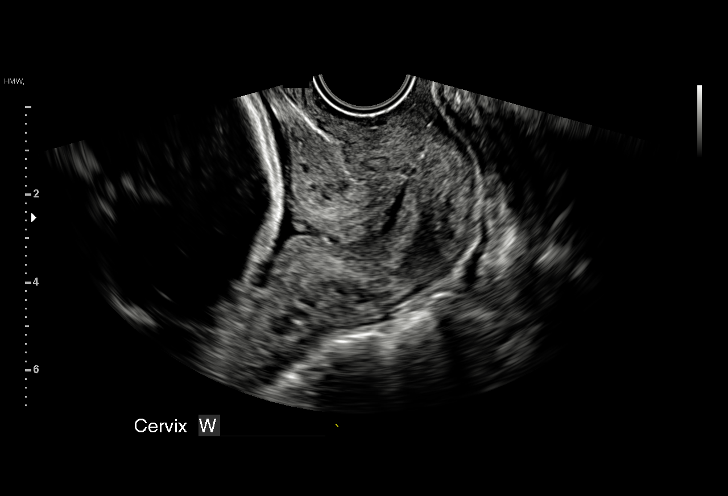
[im 37/40]
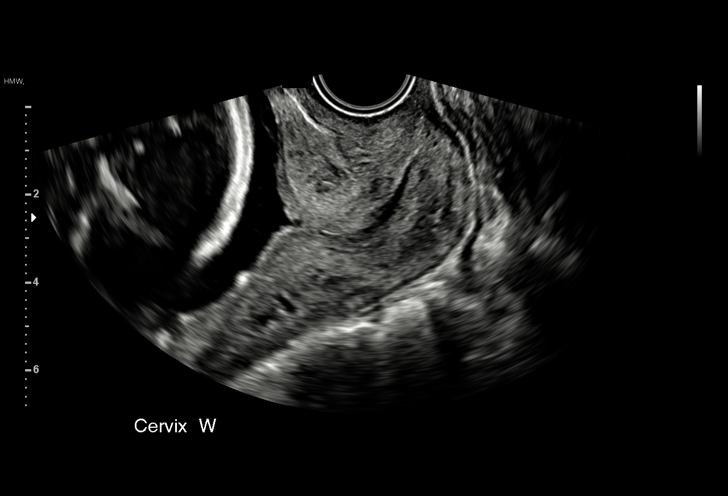
[im 40/40]
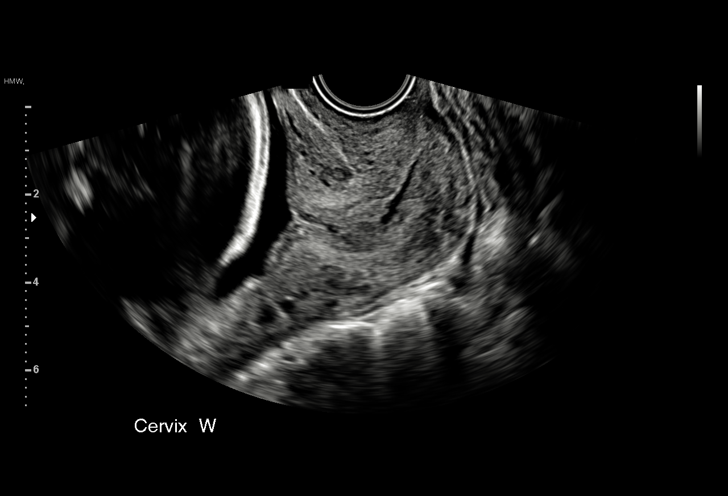

[14 of 28 positions shown; findings below may reference images not displayed]

Indications

 Abdominal pain in pregnancy
 23 weeks gestation of pregnancy
 Twin pregnancy, di/di, second trimester
 Fetal abnormality - other known or
 suspected (ICEF twin B)
Fetal Evaluation (Fetus A)

 Num Of Fetuses:         2
 Fetal Heart Rate(bpm):  155
 Cardiac Activity:       Observed
 Fetal Lie:              Maternal right side
 Presentation:           Cephalic
 Placenta:               Posterior
 Membrane Desc:      Dividing Membrane seen

 Amniotic Fluid
 AFI FV:      Within normal limits

                             Largest Pocket(cm)
                             5.
OB History

 Gravidity:    1
Gestational Age (Fetus A)

 LMP:           23w 2d        Date:  02/22/20                 EDD:   11/28/20
 Best:          23w 2d     Det. By:  LMP  (02/22/20)          EDD:   11/28/20
Anatomy (Fetus A)

 Thoracic:              Appears normal         Bladder:                Appears normal
 Stomach:               Appears normal, left
                        sided

Fetal Evaluation (Fetus B)

 Num Of Fetuses:         2
 Fetal Heart Rate(bpm):  152
 Cardiac Activity:       Observed
 Fetal Lie:              Maternal left side
 Presentation:           Breech
 Placenta:               Posterior
 Membrane Desc:      Dividing Membrane seen

 Amniotic Fluid
 AFI FV:      Within normal limits

                             Largest Pocket(cm)

Gestational Age (Fetus B)

 LMP:           23w 2d        Date:  02/22/20                 EDD:   11/28/20
 Best:          23w 2d     Det. By:  LMP  (02/22/20)          EDD:   11/28/20
Anatomy (Fetus B)

 Thoracic:              Appears normal         Bladder:                Appears normal
 Stomach:               Appears normal, left
                        sided
Cervix Uterus Adnexa

 Cervix
 Length:              4  cm.
 Measured transvaginally.

 Uterus
 No abnormality visualized.

 Right Ovary
 No adnexal mass visualized.

 Left Ovary
 No adnexal mass visualized.

 Cul De Sac
 No free fluid seen.

 Adnexa
 No abnormality visualized.
Impression

 Limited exam in diamniotic and dichorionic twin pregnancy
 due to maternal abdominal pain
 Twin A-Cephalic normal amnitoic fluid, stomach and bladder.
 Good fetal movement
 Twin B- Breech, normal amniotic fluid, stomach and bladder.
 Good fetal movement.
Recommendations

 Clinical correlation recommended.
 Continue serial growth exams.

## 2023-01-30 ENCOUNTER — Ambulatory Visit: Payer: Medicaid Other | Admitting: Cardiology

## 2023-02-05 ENCOUNTER — Encounter: Payer: Self-pay | Admitting: Family Medicine

## 2023-02-05 ENCOUNTER — Ambulatory Visit (INDEPENDENT_AMBULATORY_CARE_PROVIDER_SITE_OTHER): Payer: Medicaid Other | Admitting: Family Medicine

## 2023-02-05 ENCOUNTER — Other Ambulatory Visit (HOSPITAL_COMMUNITY)
Admission: RE | Admit: 2023-02-05 | Discharge: 2023-02-05 | Disposition: A | Discharge status: Home / Self Care | Payer: Medicaid Other | Source: Ambulatory Visit | Attending: Family Medicine | Admitting: Family Medicine

## 2023-02-05 VITALS — BP 139/98 | HR 77 | Wt 186.0 lb

## 2023-02-05 DIAGNOSIS — Z113 Encounter for screening for infections with a predominantly sexual mode of transmission: Secondary | ICD-10-CM | POA: Insufficient documentation

## 2023-02-05 DIAGNOSIS — Z01419 Encounter for gynecological examination (general) (routine) without abnormal findings: Secondary | ICD-10-CM

## 2023-02-05 DIAGNOSIS — Z8742 Personal history of other diseases of the female genital tract: Secondary | ICD-10-CM

## 2023-02-05 DIAGNOSIS — Z1339 Encounter for screening examination for other mental health and behavioral disorders: Secondary | ICD-10-CM | POA: Diagnosis not present

## 2023-02-05 DIAGNOSIS — Z302 Encounter for sterilization: Secondary | ICD-10-CM | POA: Diagnosis not present

## 2023-02-05 NOTE — Progress Notes (Signed)
ANNUAL EXAM Patient name: Alexis [name] MRN [id number]  Date of birth: 2000-11-16 Chief Complaint:   Annual Exam  History of Present Illness:   Alexis [name] is a 22 y.o.  [phone]  female  being seen today for a routine annual exam.  Current complaints: She is 4 months postpartum. Had abnormal PAP at the beginning of her last pregnancy - was told she needed a repeat.   She also desires a BTL - she has 3 children and had preeclampsia with her first pregnancy and an episode of SVT with her last pregnancy. She does not want to do any hormonal birth control and does not want to get pregnant again.  Patient's last menstrual period was 01/31/2023 (exact date).    Last pap 2023. Results were:  abnormal per patient      02/05/2023   10:26 AM 05/28/2021   11:43 AM  Depression screen PHQ 2/9  Decreased Interest 0 0  Down, Depressed, Hopeless 0 0  PHQ - 2 Score 0 0  Altered sleeping 0 1  Tired, decreased energy 1 1  Change in appetite 0 0  Feeling bad or failure about yourself  0 0  Trouble concentrating 0 0  Moving slowly or fidgety/restless 0 0  Suicidal thoughts 0 0  PHQ-9 Score 1 2        02/05/2023   10:26 AM 05/28/2021   11:44 AM  GAD 7 : Generalized Anxiety Score  Nervous, Anxious, on Edge 1 1  Control/stop worrying 0 0  Worry too much - different things 0 0  Trouble relaxing 0 0  Restless 0 0  Easily annoyed or irritable 0 0  Afraid - awful might happen 0 0  Total GAD 7 Score 1 1     Review of Systems:   Pertinent items are noted in HPI Denies any headaches, blurred vision, fatigue, shortness of breath, chest pain, abdominal pain, abnormal vaginal discharge/itching/odor/irritation, problems with periods, bowel movements, urination, or intercourse unless otherwise stated above. Pertinent History Reviewed:  Reviewed past medical,surgical, social and family history.  Reviewed problem list, medications and allergies. Physical Assessment:    Vitals:   02/05/23 1020 02/05/23 1027  BP: (!) 140/83 (!) 139/98  Pulse: 68 77  Weight: 186 lb (84.4 kg)   Body mass index is 30.02 kg/m.        Physical Examination:   General appearance - well appearing, and in no distress  Mental status - alert, oriented to person, place, and time  Psych:  She has a normal mood and affect  Skin - warm and dry, normal color, no suspicious lesions noted  Chest - effort normal, all lung fields clear to auscultation bilaterally  Heart - normal rate and regular rhythm  Neck:  midline trachea, no thyromegaly or nodules  Breasts - breasts appear normal, no suspicious masses, no skin or nipple changes or axillary nodes  Abdomen - soft, nontender, nondistended, no masses or organomegaly  Pelvic - VULVA: normal appearing vulva with no masses, tenderness or lesions  VAGINA: normal appearing vagina with normal color and discharge, no lesions  CERVIX: normal appearing cervix without discharge or lesions, no CMT  Thin prep pap is done with HR HPV cotesting  UTERUS: uterus is felt to be normal size, shape, consistency and nontender   ADNEXA: No adnexal masses or tenderness noted.  Extremities:  No swelling or varicosities noted  Chaperone present for exam  Assessment & Plan:  1. Well woman exam with  routine gynecological exam - Cytology - PAP( [place]) - Cervicovaginal ancillary only( [place])  2. Screen for STD (sexually transmitted disease) - Cervicovaginal ancillary only( [place]) - HIV antibody (with reflex) - Hepatitis C Antibody - Hepatitis B Surface AntiGEN - RPR  3. Request for sterilization Discussed alternatives, including LARCs. The patient is adamant that she does not want any more children and said "I've got things that I want to do with my life". She does not want to do anything hormonal due to negative side effects and does not want an IUD. Signed medicaid paperwork for BTL. Discussed procedure. I told her that I do not do  this procedure but would try to find one of my partners who is willing to do the procedure.  4. History of abnormal cervical Pap smear PAP today   Labs/procedures today:   Orders Placed This Encounter  Procedures   HIV antibody (with reflex)   Hepatitis C Antibody   Hepatitis B Surface AntiGEN   RPR    Meds: No orders of the defined types were placed in this encounter.   Follow-up: No follow-ups on file.  [name], DO 02/05/2023 1:28 PM

## 2023-02-06 ENCOUNTER — Encounter: Payer: Self-pay | Admitting: Family Medicine

## 2023-02-06 LAB — CERVICOVAGINAL ANCILLARY ONLY
Bacterial Vaginitis (gardnerella): POSITIVE — AB
Candida Glabrata: NEGATIVE
Candida Vaginitis: NEGATIVE
Chlamydia: NEGATIVE
Comment: NEGATIVE
Comment: NEGATIVE
Comment: NEGATIVE
Comment: NEGATIVE
Comment: NEGATIVE
Comment: NORMAL
Neisseria Gonorrhea: NEGATIVE
Trichomonas: NEGATIVE

## 2023-02-06 LAB — CYTOLOGY - PAP
Chlamydia: NEGATIVE
Comment: NEGATIVE
Comment: NORMAL
Diagnosis: NEGATIVE
Neisseria Gonorrhea: NEGATIVE

## 2023-02-06 LAB — HEPATITIS C ANTIBODY: Hep C Virus Ab: NONREACTIVE

## 2023-02-06 LAB — RPR: RPR Ser Ql: NONREACTIVE

## 2023-02-06 LAB — HEPATITIS B SURFACE ANTIGEN: Hepatitis B Surface Ag: NEGATIVE

## 2023-02-06 LAB — HIV ANTIBODY (ROUTINE TESTING W REFLEX): HIV Screen 4th Generation wRfx: NONREACTIVE

## 2023-02-09 ENCOUNTER — Other Ambulatory Visit: Payer: Self-pay

## 2023-02-09 ENCOUNTER — Encounter: Payer: Self-pay | Admitting: Family Medicine

## 2023-02-09 DIAGNOSIS — N760 Acute vaginitis: Secondary | ICD-10-CM

## 2023-02-09 MED ORDER — METRONIDAZOLE 500 MG PO TABS
500.0000 mg | ORAL_TABLET | Freq: Two times a day (BID) | ORAL | 0 refills | Status: DC
Start: 2023-02-09 — End: 2023-02-18

## 2023-02-09 NOTE — Progress Notes (Signed)
Patient sent Mychart message requesting a Rx for BV. Flagyl 500 mg 1 tab PO x 7 days was sent to hr pharmacy.  Alexis [name] l Alexis [name], CMA

## 2023-02-17 ENCOUNTER — Encounter: Payer: Self-pay | Admitting: Family Medicine

## 2023-02-18 ENCOUNTER — Ambulatory Visit: Payer: Medicaid Other | Admitting: Family Medicine

## 2023-02-18 ENCOUNTER — Other Ambulatory Visit: Payer: Self-pay

## 2023-02-18 ENCOUNTER — Encounter: Payer: Self-pay | Admitting: Family Medicine

## 2023-02-18 VITALS — BP 122/76 | HR 86 | Temp 99.4°F | Ht 65.5 in | Wt 186.4 lb

## 2023-02-18 DIAGNOSIS — Z0000 Encounter for general adult medical examination without abnormal findings: Secondary | ICD-10-CM | POA: Diagnosis not present

## 2023-02-18 DIAGNOSIS — Z23 Encounter for immunization: Secondary | ICD-10-CM

## 2023-02-18 LAB — LIPID PANEL
Cholesterol: 170 mg/dL (ref 0–200)
HDL: 44 mg/dL (ref >39.00)
LDL Cholesterol: 98 mg/dL (ref 0–99)
NonHDL: 125.89
Total CHOL/HDL Ratio: 4
Triglycerides: 138 mg/dL (ref 0.0–149.0)
VLDL: 27.6 mg/dL (ref 0.0–40.0)

## 2023-02-18 LAB — COMPREHENSIVE METABOLIC PANEL
ALT: 13 U/L (ref 0–35)
AST: 14 U/L (ref 0–37)
Albumin: 4.4 g/dL (ref 3.5–5.2)
Alkaline Phosphatase: 49 U/L (ref 39–117)
BUN: 12 mg/dL (ref 6–23)
CO2: 27 meq/L (ref 19–32)
Calcium: 9.1 mg/dL (ref 8.4–10.5)
Chloride: 105 meq/L (ref 96–112)
Creatinine, Ser: 0.8 mg/dL (ref 0.40–1.20)
GFR: 104.8 mL/min (ref >60.00)
Glucose, Bld: 76 mg/dL (ref 70–99)
Potassium: 4.4 meq/L (ref 3.5–5.1)
Sodium: 138 meq/L (ref 135–145)
Total Bilirubin: 0.5 mg/dL (ref 0.2–1.2)
Total Protein: 7.4 g/dL (ref 6.0–8.3)

## 2023-02-18 LAB — CBC
HCT: 39.9 % (ref 36.0–46.0)
Hemoglobin: 12.8 g/dL (ref 12.0–15.0)
MCHC: 32.2 g/dL (ref 30.0–36.0)
MCV: 92.5 fl (ref 78.0–100.0)
Platelets: 265 10*3/uL (ref 150.0–400.0)
RBC: 4.31 Mil/uL (ref 3.87–5.11)
RDW: 12.4 % (ref 11.5–15.5)
WBC: 4.4 10*3/uL (ref 4.0–10.5)

## 2023-02-18 MED ORDER — FLUCONAZOLE 150 MG PO TABS: 150.0000 mg | ORAL_TABLET | Freq: Once | ORAL | 0 refills | Status: AC

## 2023-02-18 MED ORDER — FLUTICASONE PROPIONATE 50 MCG/ACT NA SUSP: 2.0000 | Freq: Every day | NASAL | 6 refills | Status: DC

## 2023-02-18 NOTE — Patient Instructions (Addendum)
Give us 2-3 business days to get the results of your labs back.   Keep the diet clean and stay active.  Please get me a copy of your advanced directive form at your convenience.   Let us know if you need anything.  

## 2023-02-18 NOTE — Progress Notes (Signed)
Chief Complaint  Patient presents with   New Patient (Initial Visit)    Hypertension      Well Woman Alexis [name] is here for a complete physical.   Her last physical was >1 year ago.  Current diet: in general, a "healthy" diet. Current exercise: walking. Contraception? No Patient's last menstrual period was 01/31/2023 (exact date). Fatigue out of ordinary? No Seatbelt? Yes Advanced directive? No  Health Maintenance Pap/HPV- Yes Tetanus- Yes HIV screening- Yes Hep C screening- Yes  Past Medical History:  Diagnosis Date   Anxiety    GERD (gastroesophageal reflux disease)    History of postpartum hemorrhage, currently pregnant    History of pre-eclampsia      Past Surgical History:  Procedure Laterality Date   NO PAST SURGERIES      Medications  No current outpatient medications on file prior to visit.   No current facility-administered medications on file prior to visit.     Allergies Allergies  Allergen Reactions   Dust Mite Extract Hives   Mixed Ragweed Hives   Venofer [Iron Sucrose] Other (See Comments)    Chest pain    Review of Systems: Constitutional:  no unexpected weight changes Eye:  no recent significant change in vision Ear/Nose/Mouth/Throat:  Ears:  no tinnitus or vertigo and no recent change in hearing Nose/Mouth/Throat:  no complaints of nasal congestion, no sore throat Cardiovascular: no chest pain Respiratory:  no cough and no shortness of breath Gastrointestinal:  no abdominal pain, no change in bowel habits GU:  Female: negative for dysuria or pelvic pain Musculoskeletal/Extremities:  no pain of the joints Integumentary (Skin/Breast):  no abnormal skin lesions reported Neurologic:  no headaches Endocrine:  denies fatigue Hematologic/Lymphatic:  No areas of easy bleeding  Exam BP 122/76 (BP Location: Left Arm, Cuff Size: Normal)   Pulse 86   Temp 99.4 F (37.4 C) (Oral)   Ht 5' 5.5" (1.664 m)   Wt 186 lb 6 oz (84.5  kg)   LMP 01/31/2023 (Exact Date)   SpO2 99%   BMI 30.54 kg/m  General:  well developed, well nourished, in no apparent distress Skin:  no significant moles, warts, or growths Head:  no masses, lesions, or tenderness Eyes:  pupils equal and round, sclera anicteric without injection Ears:  canals without lesions, TMs shiny without retraction, no obvious effusion, no erythema Nose:  nares patent, mucosa normal, and no drainage  Throat/Pharynx:  lips and gingiva without lesion; tongue and uvula midline; non-inflamed pharynx; no exudates or postnasal drainage Neck: neck supple without adenopathy, thyromegaly, or masses Lungs:  clear to auscultation, breath sounds equal bilaterally, no respiratory distress Cardio:  regular rate and rhythm, no bruits, no LE edema Abdomen:  abdomen soft, nontender; bowel sounds normal; no masses or organomegaly Genital: Defer to GYN Musculoskeletal:  symmetrical muscle groups noted without atrophy or deformity Extremities:  no clubbing, cyanosis, or edema, no deformities, no skin discoloration Neuro:  gait normal; deep tendon reflexes normal and symmetric Psych: well oriented with normal range of affect and appropriate judgment/insight  Assessment and Plan  Well adult exam - Plan: CBC, Comprehensive metabolic panel, Lipid panel   Well 22 y.o. female. Counseled on diet and exercise. Advanced directive form provided today.  Other orders as above. Follow up in 1 yr. The patient voiced understanding and agreement to the plan.  [name], DO 02/18/23 9:28 AM

## 2023-02-23 ENCOUNTER — Telehealth (INDEPENDENT_AMBULATORY_CARE_PROVIDER_SITE_OTHER): Payer: Medicaid Other | Admitting: Obstetrics and Gynecology

## 2023-02-23 ENCOUNTER — Encounter: Payer: Self-pay | Admitting: Obstetrics and Gynecology

## 2023-02-23 DIAGNOSIS — Z3009 Encounter for other general counseling and advice on contraception: Secondary | ICD-10-CM

## 2023-02-23 NOTE — Progress Notes (Signed)
GYNECOLOGY VIRTUAL VISIT ENCOUNTER NOTE  Provider location: Center for [facility]   Patient location: Home  I connected with Alexis [name] on 02/23/23 at 10:55 AM EDT by MyChart Video Encounter and verified that I am speaking with the correct person using two identifiers.   I discussed the limitations, risks, security and privacy concerns of performing an evaluation and management service virtually and the availability of in person appointments. I also discussed with the patient that there may be a patient responsible charge related to this service. The patient expressed understanding and agreed to proceed.   History:  Alexis [name] is a 22 y.o. [phone] female being evaluated today for unwanted fertility.   She does not want an IUD and she does not want anything hormonal.   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 She is happy with her children but does not want anymore under any circumstance. She does not want LARC options.    Past Medical History:  Diagnosis Date   Anxiety    GERD (gastroesophageal reflux disease)    History of postpartum hemorrhage, currently pregnant    History of pre-eclampsia    Past Surgical History:  Procedure Laterality Date   NO PAST SURGERIES     The following portions of the patient's history were reviewed and updated as appropriate: allergies, current medications, past family history, past medical history, past social history, past surgical history and problem list.   Review of Systems:  Pertinent items noted in HPI and remainder of comprehensive ROS otherwise negative.  Physical Exam:   General:  Alert, oriented and cooperative. Patient appears to be in no acute distress.  Mental Status: Normal mood and affect. Normal behavior. Normal judgment and thought content.   Respiratory: Normal respiratory effort, no problems with respiration noted  Rest  of physical exam deferred due to type of encounter  Labs and Imaging Results for orders placed or performed in visit on 02/18/23 (from the past 336 hour(s))  CBC   Collection Time: 02/18/23  9:29 AM  Result Value Ref Range   WBC 4.4 4.0 - 10.5 K/uL   RBC 4.31 3.87 - 5.11 Mil/uL   Platelets 265.0 150.0 - 400.0 K/uL   Hemoglobin 12.8 12.0 - 15.0 g/dL   HCT 45.4 09.8 - 11.9 %   MCV 92.5 78.0 - 100.0 fl   MCHC 32.2 30.0 - 36.0 g/dL   RDW 14.7 82.9 - 56.2 %  Comprehensive metabolic panel   Collection Time: 02/18/23  9:29 AM  Result Value Ref Range   Sodium 138 135 - 145 mEq/L   Potassium 4.4 3.5 - 5.1 mEq/L   Chloride 105 96 - 112 mEq/L   CO2 27 19 - 32 mEq/L   Glucose, Bld 76 70 - 99 mg/dL   BUN 12 6 - 23 mg/dL   Creatinine, Ser 1.30 0.40 - 1.20 mg/dL   Total Bilirubin 0.5 0.2 - 1.2 mg/dL   Alkaline Phosphatase 49 39 - 117 U/L   AST 14 0 - 37 U/L   ALT 13 0 - 35 U/L   Total Protein 7.4 6.0 - 8.3 g/dL   Albumin 4.4 3.5 - 5.2 g/dL   GFR 865.78 >46.96 mL/min   Calcium 9.1 8.4 - 10.5 mg/dL  Lipid panel   Collection Time: 02/18/23  9:29 AM  Result Value Ref Range   Cholesterol 170 0 - 200 mg/dL  Triglycerides 138.0 0.0 - 149.0 mg/dL   HDL 16.10 >96.04 mg/dL   VLDL 54.0 0.0 - 98.1 mg/dL   LDL Cholesterol 98 0 - 99 mg/dL   Total CHOL/HDL Ratio 4    NonHDL 125.89    No results found.     Assessment and Plan:     1. Unwanted fertility - She desires permanent sterilization. Discussed alternatives including LARC options and vasectomy. She declines these options. She is 100% sure she is done with childbearing under all circumstances.  - Discussed surgery of salpingectomy vs tubal ligation. She would like to do a salpingectomy.  - Risks of surgery include but are not limited to: bleeding, infection, injury to surrounding organs/tissues (i.e. bowel/bladder/ureters), need for additional procedures, wound complications, hospital re-admission, and conversion to open surgery, VTE -  Reviewed restrictions and recovery following surgery - Message sent to schedule surgery in October. She has signed her tubal papers on 8/22.        I discussed the assessment and treatment plan with the patient. The patient was provided an opportunity to ask questions and all were answered. The patient agreed with the plan and demonstrated an understanding of the instructions.   The patient was advised to call back or seek an in-person evaluation/go to the ED if the symptoms worsen or if the condition fails to improve as anticipated.  I provided 8 minutes of face-to-face time during this encounter.   Milas Hock, MD Center for [facility], [facility]

## 2023-02-25 ENCOUNTER — Telehealth: Payer: Self-pay

## 2023-02-25 NOTE — Telephone Encounter (Signed)
Called patient to schedule surgery w/ Dr. Para March. Patient agreed to have procedure on 03/17/23 at 3 pm @[facility] . Provided pre-op information over the phone and confirmed patient consent form was signed 30 days prior to surgery date.

## 2023-03-03 IMAGING — US US ABDOMEN LIMITED RUQ/ASCITES
1 series · 15 of 25 positions shown · non-contrast
Comparison: None.

CLINICAL DATA: Right upper quadrant pain

Currently pregnant
EXAM:
ULTRASOUND ABDOMEN LIMITED RIGHT UPPER QUADRANT

[Series 1: us abdomen limited ruq/ascites · 15 of 40 slices shown]
[im 1/40]
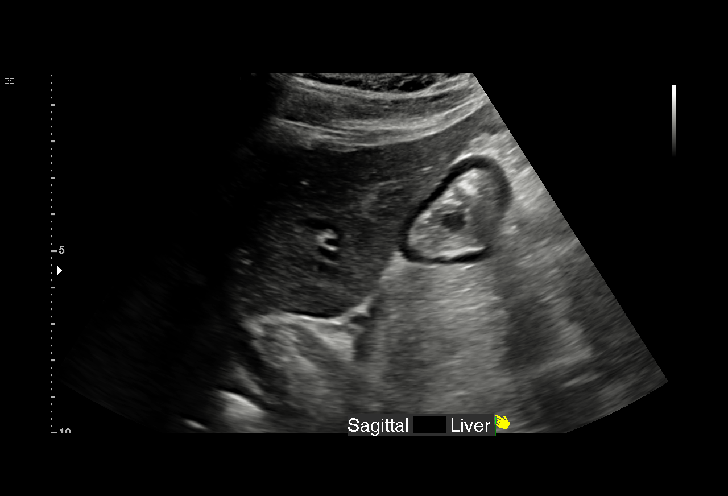
[im 4/40]
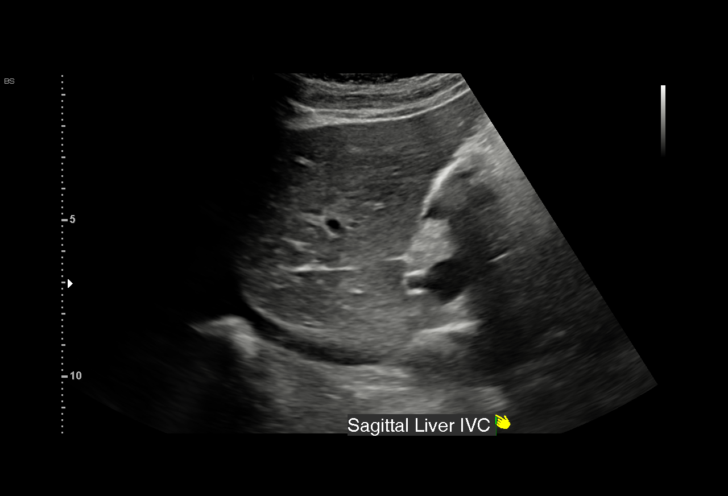
[im 7/40]
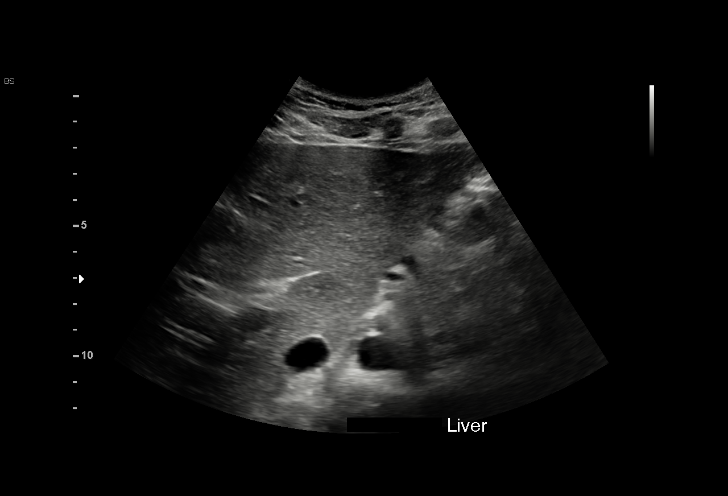
[im 9/40]
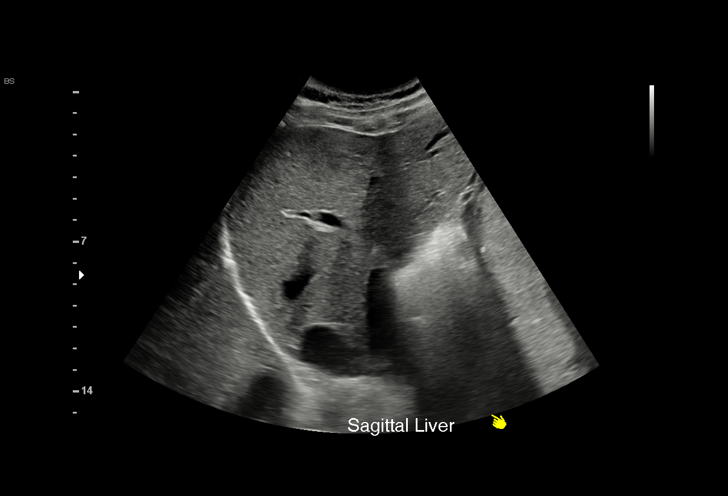
[im 12/40]
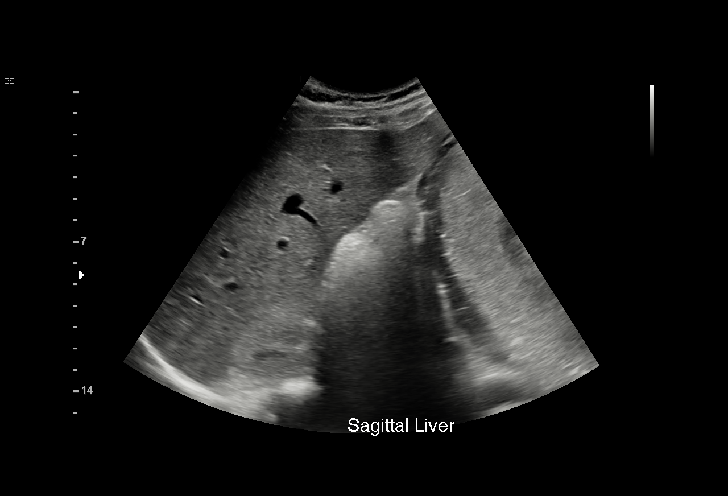
[im 15/40]
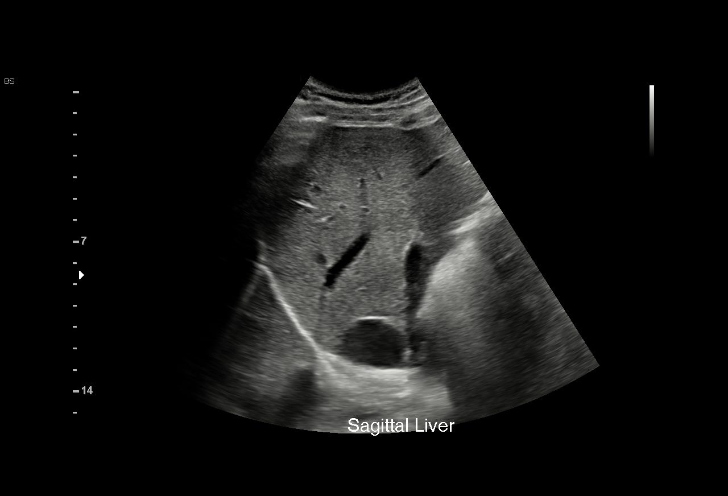
[im 17/40]
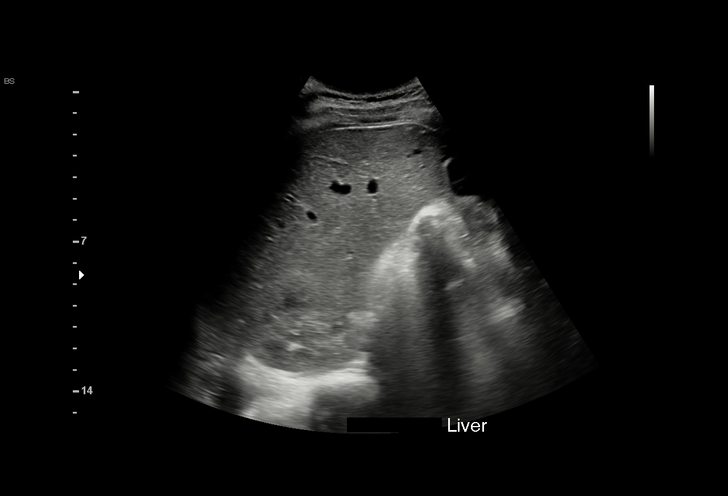
[im 20/40]
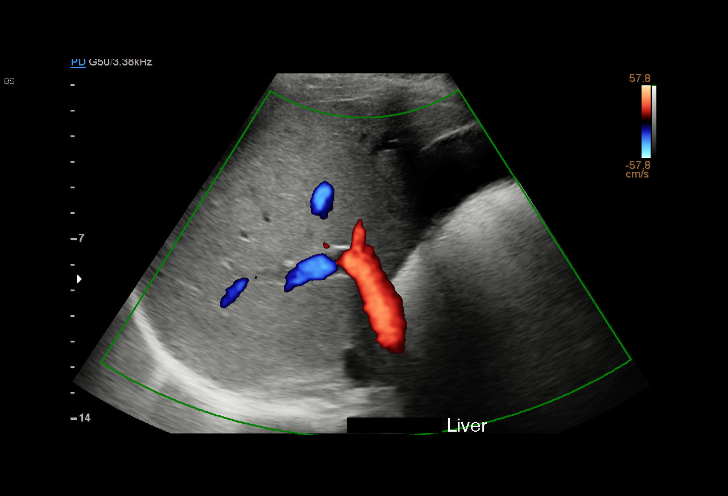
[im 23/40]
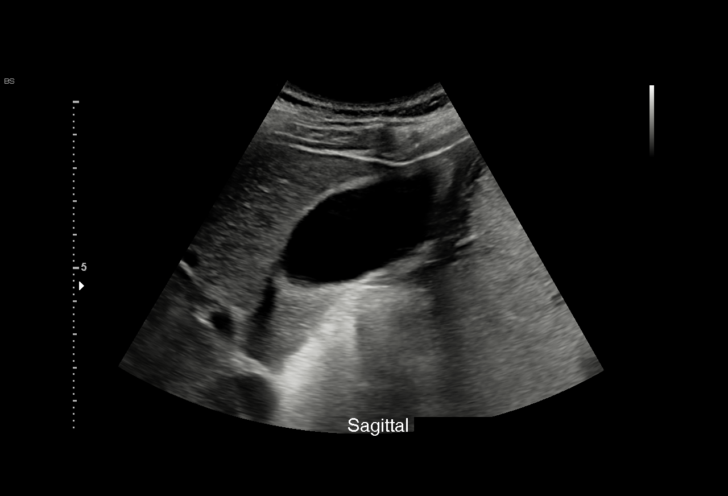
[im 25/40]
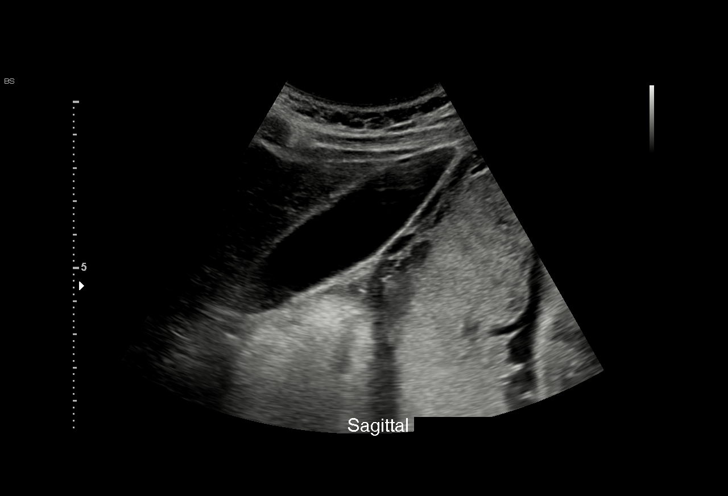
[im 28/40]
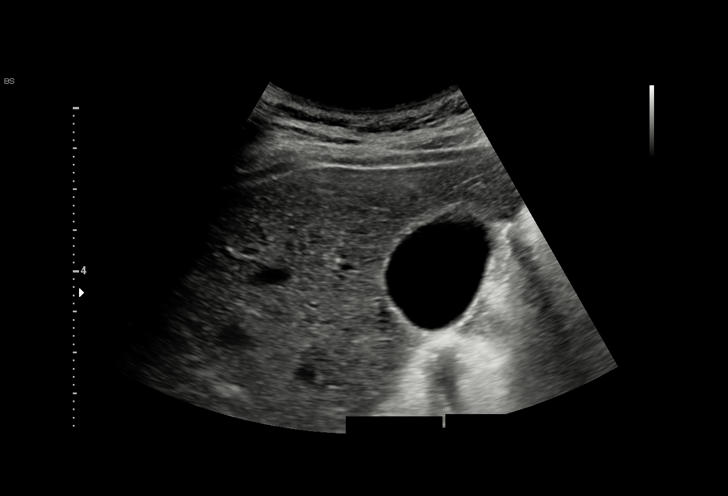
[im 31/40]
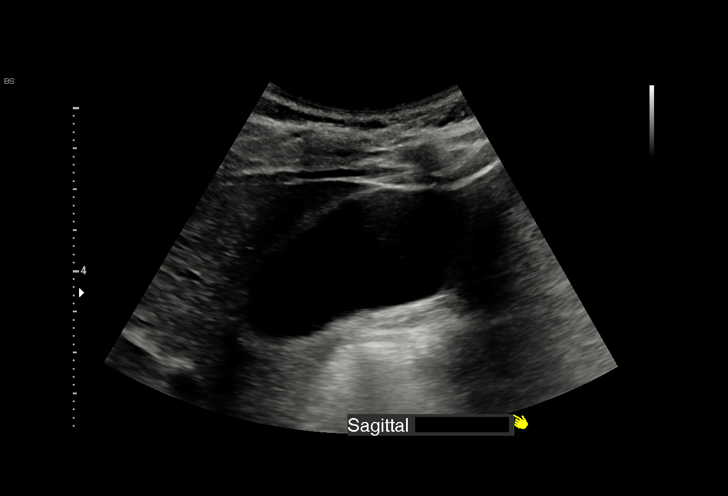
[im 33/40]
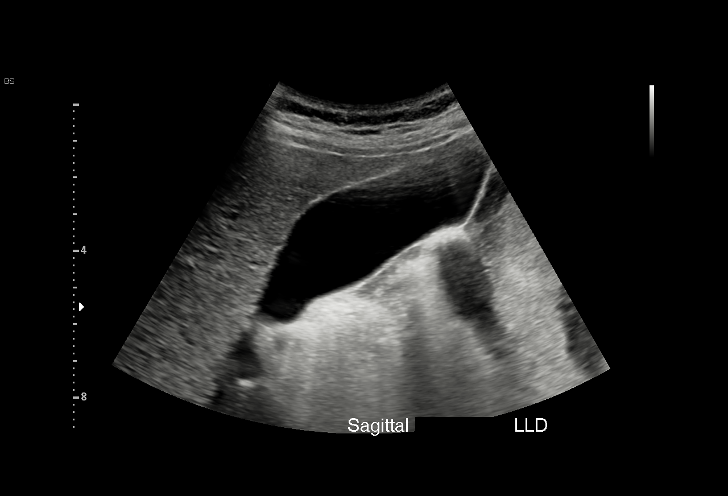
[im 36/40]
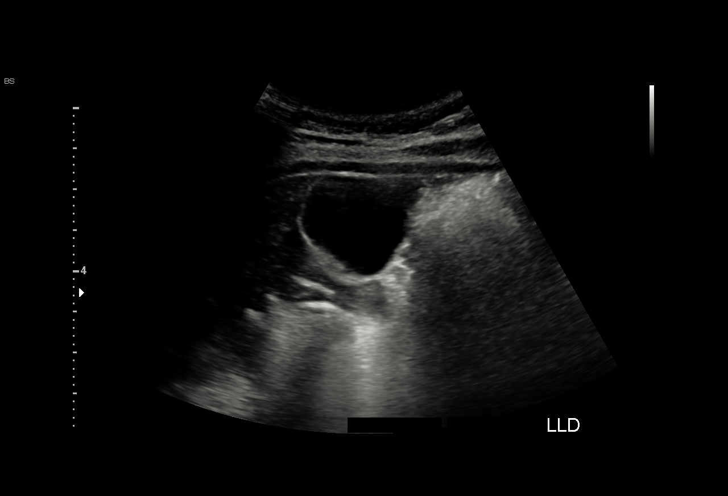
[im 40/40]
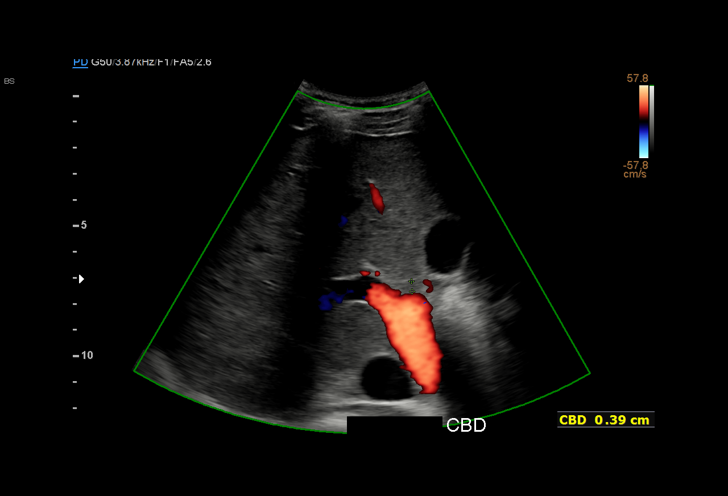

[15 of 25 positions shown; findings below may reference images not displayed]

FINDINGS: Gallbladder:

No gallstones or wall thickening visualized. No sonographic Murphy
sign noted by sonographer.

Common bile duct:

Diameter: 4 mm

Liver:

No focal lesion identified. Within normal limits in parenchymal
echogenicity. Portal vein is patent on color Doppler imaging with
normal direction of blood flow towards the liver.

Other: None.
IMPRESSION: Normal sonographic appearance of the liver and gallbladder

## 2023-03-09 ENCOUNTER — Telehealth: Payer: Self-pay

## 2023-03-09 ENCOUNTER — Other Ambulatory Visit: Payer: Self-pay

## 2023-03-09 ENCOUNTER — Encounter: Payer: Self-pay | Admitting: Family Medicine

## 2023-03-09 DIAGNOSIS — B379 Candidiasis, unspecified: Secondary | ICD-10-CM

## 2023-03-09 MED ORDER — FLUCONAZOLE 150 MG PO TABS
ORAL_TABLET | ORAL | 1 refills | Status: DC
Start: 2023-03-09 — End: 2023-03-31

## 2023-03-09 NOTE — Progress Notes (Signed)
Patient sent Mychart message requesting a Rx for yeast. Patient states she was prescribed Amoxicillin and thinks she has a yeast infection. Diflucan 150 mg PO was sent to her pharmacy.  Malijah Lietz l Julen Rubert, CMA

## 2023-03-09 NOTE — Telephone Encounter (Signed)
Patient is in the process of moving to a new home and would like to cancel the 03/17/23 procedure w/ Dr. Para March. Scheduling notified and procedure was canceled.

## 2023-03-12 ENCOUNTER — Encounter: Payer: Self-pay | Admitting: Obstetrics and Gynecology

## 2023-03-16 ENCOUNTER — Telehealth: Payer: Self-pay

## 2023-03-16 NOTE — Telephone Encounter (Signed)
Patient called to reschedule procedure w/ Dr. Para March. Confirmed the first available date is 05/05/23. Patient will check her schedule, and call back to confirm her availability before scheduling.

## 2023-03-17 ENCOUNTER — Encounter (HOSPITAL_BASED_OUTPATIENT_CLINIC_OR_DEPARTMENT_OTHER): Payer: Self-pay

## 2023-03-17 ENCOUNTER — Ambulatory Visit (HOSPITAL_BASED_OUTPATIENT_CLINIC_OR_DEPARTMENT_OTHER): Admit: 2023-03-17 | Payer: Medicaid Other | Admitting: Obstetrics and Gynecology

## 2023-03-17 DIAGNOSIS — Z3009 Encounter for other general counseling and advice on contraception: Secondary | ICD-10-CM

## 2023-03-17 SURGERY — SALPINGECTOMY, BILATERAL, LAPAROSCOPIC
Anesthesia: Choice | Laterality: Bilateral

## 2023-03-26 IMAGING — US US MFM FETAL BPP W/O NON-STRESS
1 series · 14 of 28 positions shown · non-contrast
Comparison: none

[Series 1: us mfm fetal bpp w/o non-stress · 51 acquisitions, 14 frames shown]
[im 2/51]
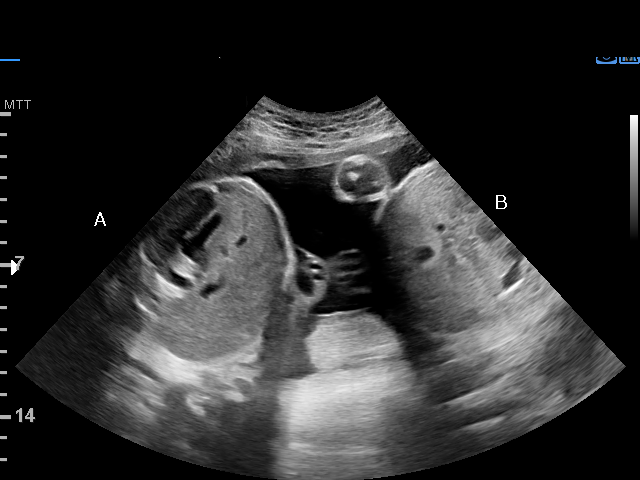
[im 6/51]
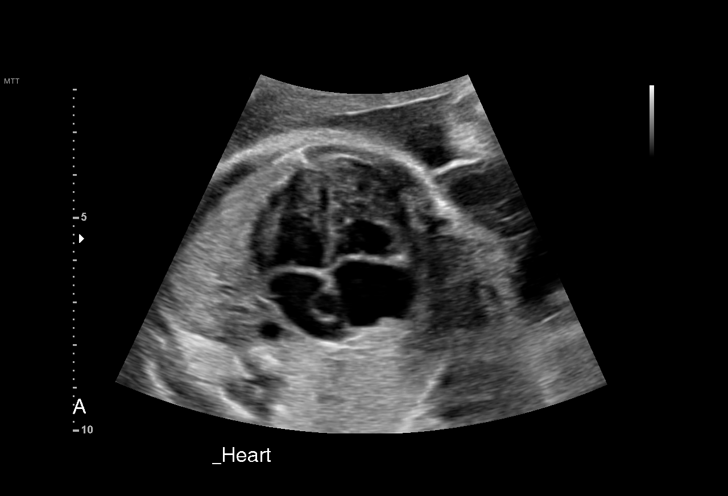
[im 10/51]
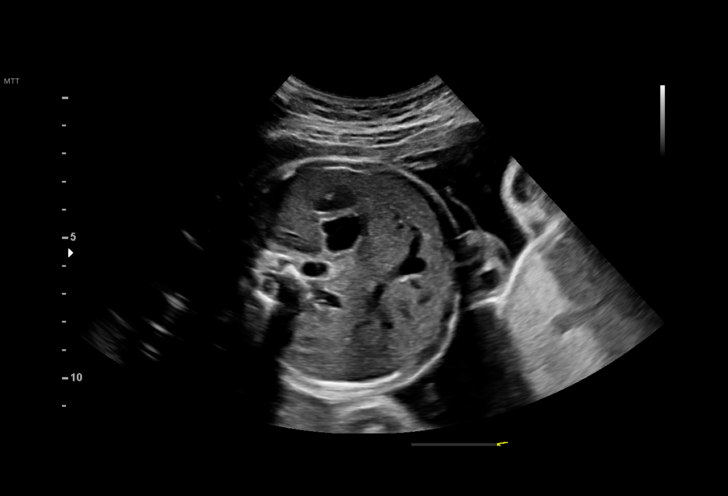
[im 13/51]
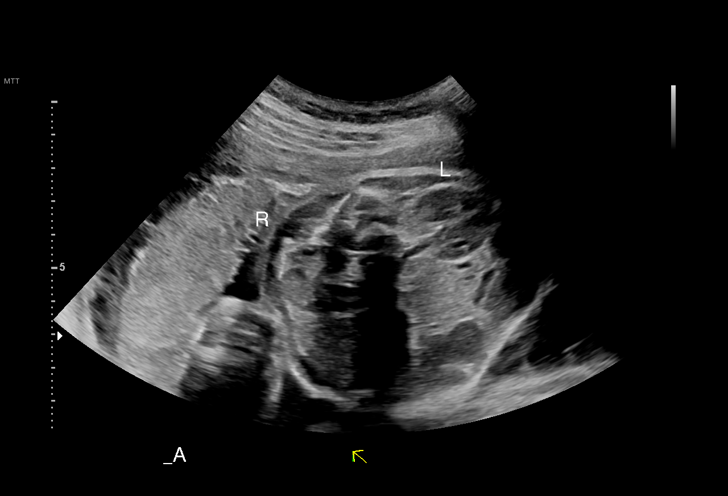
[im 17/51]
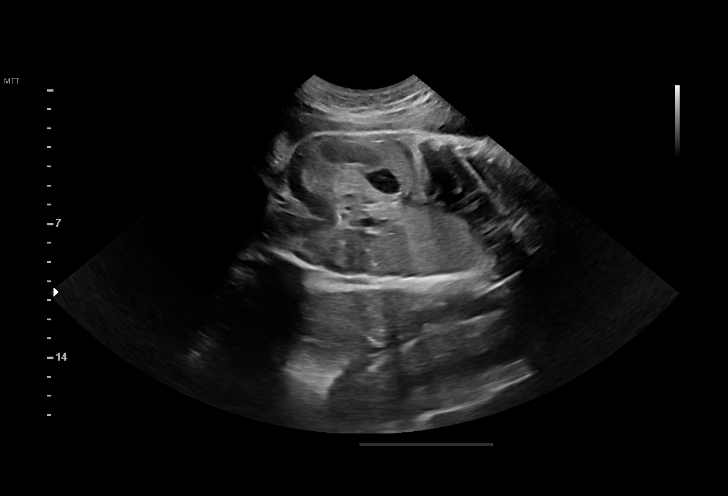
[im 21/51]
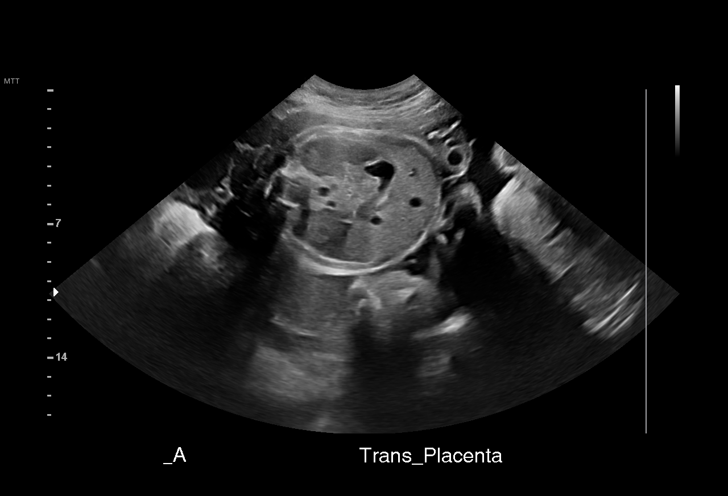
[im 25/51]
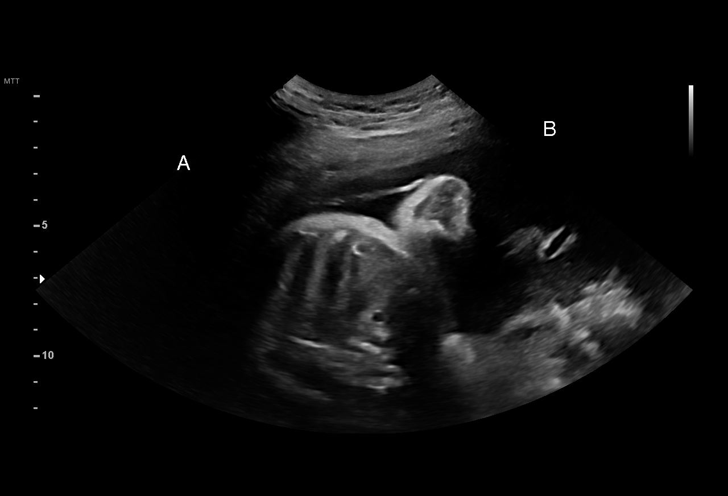
[im 28/51]
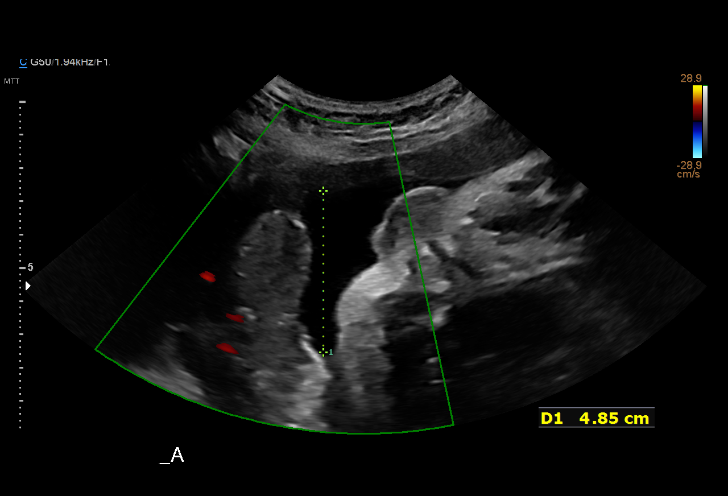
[im 32/51]
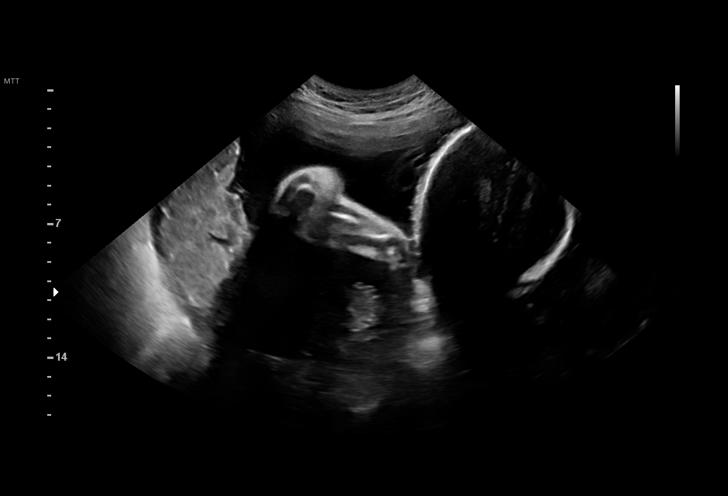
[im 36/51]
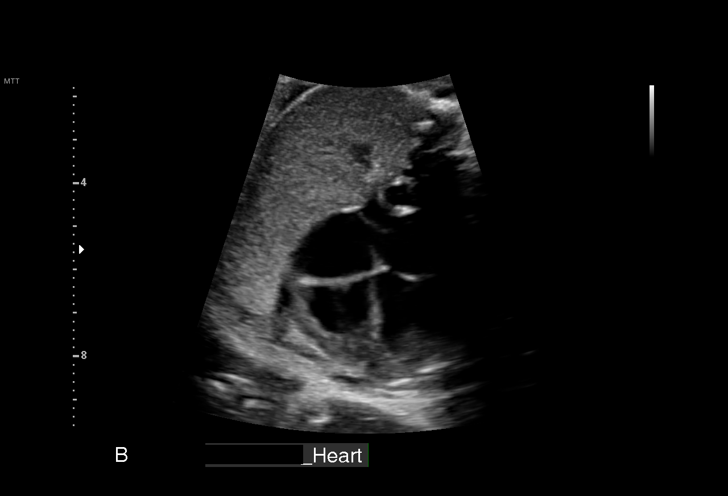
[im 39/51]
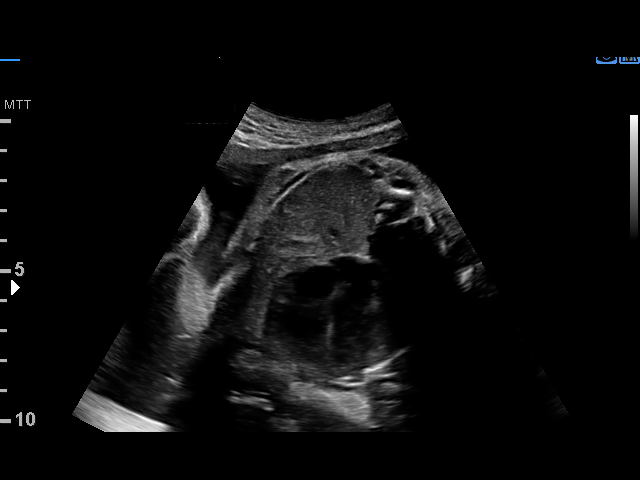
[im 43/51]
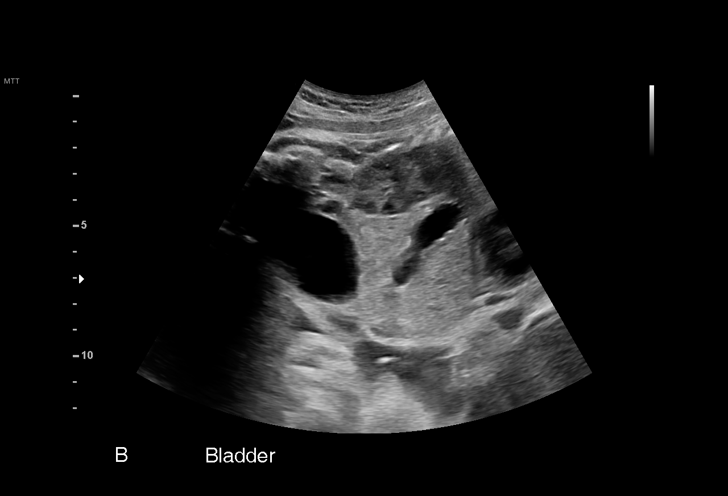
[im 47/51]
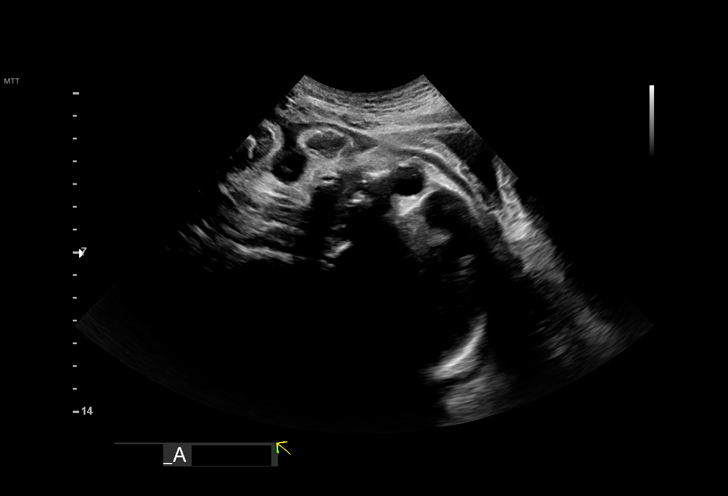
[im 51/51]
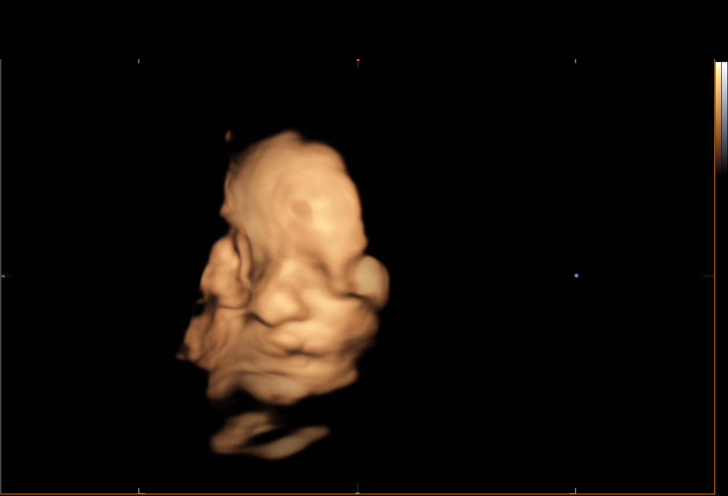

[14 of 28 positions shown; findings below may reference images not displayed]

ADDL GESTATION                                    LALKENS

Indications

 33 weeks gestation of pregnancy
 Twin pregnancy, di/di, third trimester
 Fetal abnormality - other known or
 suspected (ICEF twin B)
 Hypertension - Chronic/Pre-existing (ASA)
Fetal Evaluation (Fetus A)

 Num Of Fetuses:         2
 Fetal Heart Rate(bpm):  135
 Cardiac Activity:       Observed
 Fetal Lie:              Lower Fetus on Maternal Right
 Presentation:           Cephalic
 Placenta:               Posterior
 P. Cord Insertion:      Previously Visualized

 Amniotic Fluid
 AFI FV:      Within normal limits

                             Largest Pocket(cm)

Biophysical Evaluation (Fetus A)

 Amniotic F.V:   Pocket => 2 cm             F. Tone:        Observed
 F. Movement:    Observed                   Score:          [DATE]
 F. Breathing:   Observed
OB History

 Gravidity:    1
Gestational Age (Fetus A)

 LMP:           33w 0d        Date:  02/22/20                 EDD:   11/28/20
 Best:          33w 0d     Det. By:  LMP  (02/22/20)          EDD:   11/28/20
Anatomy (Fetus A)

 Ventricles:            Appears normal         Stomach:                Appears normal, left
                                                                       sided
 Heart:                 Appears normal         Kidneys:                Appear normal
                        (4CH, axis, and
                        situs)
 Diaphragm:             Appears normal         Bladder:                Appears normal

Fetal Evaluation (Fetus B)

 Num Of Fetuses:         2
 Fetal Heart Rate(bpm):  145
 Cardiac Activity:       Observed
 Fetal Lie:              Upper Fetus on Maternal Left
 Presentation:           Cephalic
 Placenta:               Posterior
 P. Cord Insertion:      Previously Visualized

 Amniotic Fluid
 AFI FV:      Within normal limits

                             Largest Pocket(cm)

Biophysical Evaluation (Fetus B)

 Amniotic F.V:   Pocket => 2 cm             F. Tone:        Observed
 F. Movement:    Observed                   Score:          [DATE]
 F. Breathing:   Observed
Gestational Age (Fetus B)

 LMP:           33w 0d        Date:  02/22/20                 EDD:   11/28/20
 Best:          33w 0d     Det. By:  LMP  (02/22/20)          EDD:   11/28/20
Anatomy (Fetus B)

 Ventricles:            Appears normal         Stomach:                Appears normal, left
                                                                       sided
 Heart:                 Appears normal; EIF    Kidneys:                Appear normal
 Diaphragm:             Appears normal         Bladder:                Appears normal
Cervix Uterus Adnexa
 Cervix
 Not visualized (advanced GA >96wks)

 Uterus
 No abnormality visualized.

 Right Ovary
 Within normal limits.

 Left Ovary
 Within normal limits.

 Cul De Sac
 No free fluid seen.

 Adnexa
 No abnormality visualized.
Impression

 Dichorionic-diamniotic twin pregnancy.
 Patient returned for antenatal testing (BPP) because of
 transient increase in her blood pressure.  Patient has chronic
 hypertension that is well controlled without antihypertensives.
 Blood pressure today at her office is 130/79 mmHg.
 Twin A: Lower fetus, maternal right, cephalic presentation,
 posterior placenta.  Amniotic fluid is normal and good fetal
 activity seen.  Antenatal testing is reassuring.  BPP [DATE].
 Twin B: Upper fetus, maternal left, cephalic presentation,
 posterior placenta. Amniotic fluid is normal and good fetal
 activity seen.  Antenatal testing is reassuring.  BPP [DATE].
 I reassured the patient of the findings.  Since her blood
 pressures have been normal, I recommend fetal growth
 assessment in 3 weeks.
Recommendations

 -An appointment was made for her to return in 3 weeks for
 fetal growth assessment and BPP.
 -Delivery at 38 weeks gestation.
                 Vika, Len[place]ka

## 2023-03-27 ENCOUNTER — Ambulatory Visit: Payer: Medicaid Other | Admitting: Family Medicine

## 2023-03-31 ENCOUNTER — Ambulatory Visit: Payer: Medicaid Other | Admitting: Physician Assistant

## 2023-03-31 ENCOUNTER — Encounter: Payer: Self-pay | Admitting: Physician Assistant

## 2023-03-31 VITALS — BP 137/89 | HR 75 | Temp 98.6°F | Ht 65.5 in | Wt 187.4 lb

## 2023-03-31 DIAGNOSIS — R591 Generalized enlarged lymph nodes: Secondary | ICD-10-CM

## 2023-03-31 NOTE — Progress Notes (Signed)
Established patient visit   Patient: Alexis [name]   DOB: 03/10/2001   22 y.o. Female  MRN: [id number] Visit Date: 03/31/2023  Today's healthcare provider: Alfredia Ferguson, PA-C   Cc. Lymph node swelling  Subjective     Discussed the use of AI scribe software for clinical note transcription with the patient, who gave verbal consent to proceed.  History of Present Illness   The patient presents with a swollen lymph node in front of her right ear that has been present for a month. She reports no associated pain or increase in size. Denies any illness around the time it appeared. She works from home and wears a headset for extended periods, which she describes as tight around the ears-- it has a hard plastic part that sits in front of her ear.       Medications: Outpatient Medications Prior to Visit  Medication Sig   [DISCONTINUED] escitalopram (LEXAPRO) 10 MG tablet Take 10 mg by mouth daily. (Patient not taking: Reported on 02/23/2023)   [DISCONTINUED] fluconazole (DIFLUCAN) 150 MG tablet Take one tablet. Repeat in 3 days if symptoms persists.   [DISCONTINUED] fluticasone (FLONASE) 50 MCG/ACT nasal spray Place 2 sprays into both nostrils daily. (Patient not taking: Reported on 02/23/2023)   [DISCONTINUED] JUNEL 1/20 1-20 MG-MCG tablet Take 1 tablet by mouth daily. (Patient not taking: Reported on 02/23/2023)   [DISCONTINUED] norethindrone (AYGESTIN) 5 MG tablet Take 5 mg by mouth 3 (three) times daily. (Patient not taking: Reported on 02/23/2023)   [DISCONTINUED] pantoprazole (PROTONIX) 40 MG tablet Take 1 tablet by mouth daily. (Patient not taking: Reported on 02/23/2023)   No facility-administered medications prior to visit.    Review of Systems     Objective    BP 137/89   Pulse 75   Temp 98.6 F (37 C) (Oral)   Ht 5' 5.5" (1.664 m)   Wt 187 lb 6 oz (85 kg)   SpO2 100%   BMI 30.71 kg/m    Physical Exam Vitals reviewed.  Constitutional:      Appearance: She  is not ill-appearing.  HENT:     Head: Normocephalic.  Eyes:     Conjunctiva/sclera: Conjunctivae normal.  Neck:     Comments: Right preauricular area w/ mild enlarged lymph node, mobile, soft. ~1 cm.  Cardiovascular:     Rate and Rhythm: Normal rate.  Pulmonary:     Effort: Pulmonary effort is normal. No respiratory distress.  Neurological:     General: No focal deficit present.     Mental Status: She is alert and oriented to person, place, and time.  Psychiatric:        Mood and Affect: Mood normal.        Behavior: Behavior normal.      No results found for any visits on 03/31/23.  Assessment & Plan    Lymphadenopathy  -soft, mobile, ~ 1 cm node.  -Advise to apply warm compresses. -Consider changing headset to reduce irritation. -If changes in size or consistency, consider ultrasound.  Reviewed recent labs.       Return if symptoms worsen or fail to improve.       Alfredia Ferguson, PA-C  [facility] at [facility] [phone] (phone) [phone] (fax)  [facility]

## 2023-04-01 ENCOUNTER — Ambulatory Visit: Payer: Medicaid Other | Admitting: Family Medicine

## 2023-04-06 ENCOUNTER — Telehealth: Payer: Self-pay | Admitting: Family Medicine

## 2023-04-06 ENCOUNTER — Other Ambulatory Visit: Payer: Self-pay | Admitting: Physician Assistant

## 2023-04-06 DIAGNOSIS — R591 Generalized enlarged lymph nodes: Secondary | ICD-10-CM

## 2023-04-06 NOTE — Telephone Encounter (Signed)
Pt called and stated that since she saw Lillia Abed last Tuesday, her symptoms hasn't improved. She mentioned that her and Lillia Abed discussed possibly having an ultrasound done if symptoms didn't improve. Please call and advise pt if an order can be sent in.

## 2023-04-16 ENCOUNTER — Other Ambulatory Visit (HOSPITAL_COMMUNITY)
Admission: RE | Admit: 2023-04-16 | Discharge: 2023-04-16 | Disposition: A | Discharge status: Home / Self Care | Payer: Medicaid Other | Source: Ambulatory Visit | Attending: Family Medicine | Admitting: Family Medicine

## 2023-04-16 ENCOUNTER — Ambulatory Visit: Payer: Medicaid Other

## 2023-04-16 ENCOUNTER — Ambulatory Visit (HOSPITAL_BASED_OUTPATIENT_CLINIC_OR_DEPARTMENT_OTHER)
Admission: RE | Admit: 2023-04-16 | Discharge: 2023-04-16 | Disposition: A | Discharge status: Home / Self Care | Payer: Medicaid Other | Source: Ambulatory Visit | Attending: Physician Assistant | Admitting: Physician Assistant

## 2023-04-16 VITALS — BP 130/86 | HR 79 | Wt 187.0 lb

## 2023-04-16 DIAGNOSIS — R300 Dysuria: Secondary | ICD-10-CM

## 2023-04-16 DIAGNOSIS — R591 Generalized enlarged lymph nodes: Secondary | ICD-10-CM | POA: Insufficient documentation

## 2023-04-16 DIAGNOSIS — N898 Other specified noninflammatory disorders of vagina: Secondary | ICD-10-CM | POA: Diagnosis present

## 2023-04-16 DIAGNOSIS — Z113 Encounter for screening for infections with a predominantly sexual mode of transmission: Secondary | ICD-10-CM | POA: Diagnosis present

## 2023-04-16 DIAGNOSIS — R350 Frequency of micturition: Secondary | ICD-10-CM | POA: Diagnosis not present

## 2023-04-16 LAB — POCT URINALYSIS DIPSTICK
Bilirubin, UA: NEGATIVE
Glucose, UA: NEGATIVE
Ketones, UA: NEGATIVE
Protein, UA: NEGATIVE
Spec Grav, UA: 1.01 (ref 1.010–1.025)
Urobilinogen, UA: 0.2 U/dL
pH, UA: 6.5 (ref 5.0–8.0)

## 2023-04-16 MED ORDER — NITROFURANTOIN MONOHYD MACRO 100 MG PO CAPS
100.0000 mg | ORAL_CAPSULE | Freq: Two times a day (BID) | ORAL | 1 refills | Status: DC
Start: 2023-04-16 — End: 2023-04-22

## 2023-04-16 MED ORDER — PHENAZOPYRIDINE HCL 200 MG PO TABS
200.0000 mg | ORAL_TABLET | Freq: Three times a day (TID) | ORAL | 1 refills | Status: DC | PRN
Start: 2023-04-16 — End: 2023-04-22

## 2023-04-16 NOTE — Progress Notes (Signed)
SUBJECTIVE:  22 y.o. female complains of dysuria, urinary frequency, and vaginal discharge. Denies abnormal vaginal bleeding or significant pelvic pain or fever. Denies history of known exposure to STD.  Patient's last menstrual period was 02/25/2023 (exact date).  OBJECTIVE:  She appears well, afebrile. Urine dipstick: positive for leukocytes.  ASSESSMENT:  Dysuria Vaginal Discharge Screening for STD's   PLAN:  GC, chlamydia, trichomonas, BVAG, CVAG probe sent to lab. Urine sent to lab for culture.  Treatment: Macrobid 100mg  BID for 7 days or until culture and sensitivity are back and Pyridium 200mg  TID PRN.  ROV prn if symptoms persist or worsen.

## 2023-04-17 ENCOUNTER — Ambulatory Visit: Payer: Self-pay

## 2023-04-17 ENCOUNTER — Emergency Department (HOSPITAL_BASED_OUTPATIENT_CLINIC_OR_DEPARTMENT_OTHER)
Admission: EM | Admit: 2023-04-17 | Discharge: 2023-04-17 | Disposition: A | Discharge status: Home / Self Care | Payer: Medicaid Other | Attending: Emergency Medicine | Admitting: Emergency Medicine

## 2023-04-17 ENCOUNTER — Encounter (HOSPITAL_BASED_OUTPATIENT_CLINIC_OR_DEPARTMENT_OTHER): Payer: Self-pay | Admitting: Urology

## 2023-04-17 ENCOUNTER — Other Ambulatory Visit: Payer: Self-pay

## 2023-04-17 DIAGNOSIS — J039 Acute tonsillitis, unspecified: Secondary | ICD-10-CM | POA: Diagnosis not present

## 2023-04-17 DIAGNOSIS — J029 Acute pharyngitis, unspecified: Secondary | ICD-10-CM | POA: Diagnosis present

## 2023-04-17 LAB — RPR: RPR Ser Ql: NONREACTIVE

## 2023-04-17 LAB — CERVICOVAGINAL ANCILLARY ONLY
Bacterial Vaginitis (gardnerella): NEGATIVE
Candida Glabrata: NEGATIVE
Candida Vaginitis: NEGATIVE
Chlamydia: NEGATIVE
Comment: NEGATIVE
Comment: NEGATIVE
Comment: NEGATIVE
Comment: NEGATIVE
Comment: NEGATIVE
Comment: NORMAL
Neisseria Gonorrhea: NEGATIVE
Trichomonas: NEGATIVE

## 2023-04-17 LAB — HEPATITIS C ANTIBODY: Hep C Virus Ab: NONREACTIVE

## 2023-04-17 LAB — HEPATITIS B SURFACE ANTIGEN: Hepatitis B Surface Ag: NEGATIVE

## 2023-04-17 LAB — HIV ANTIBODY (ROUTINE TESTING W REFLEX): HIV Screen 4th Generation wRfx: NONREACTIVE

## 2023-04-17 MED ORDER — AMOXICILLIN-POT CLAVULANATE 875-125 MG PO TABS: 1.0000 | ORAL_TABLET | Freq: Two times a day (BID) | ORAL | 0 refills | Status: DC

## 2023-04-17 MED ORDER — AMOXICILLIN-POT CLAVULANATE 875-125 MG PO TABS
1.0000 | ORAL_TABLET | Freq: Once | ORAL | Status: AC
  Administered 2023-04-17: 1 via ORAL
  Filled 2023-04-17: qty 1

## 2023-04-17 NOTE — ED Triage Notes (Signed)
Pt states sore throat to right side , cough, chills, body aches x 3 days  Was seen at [facility] Wednesday  and told she had a tonsil stone  Covid and Flu negative, Strep Neg

## 2023-04-17 NOTE — ED Provider Notes (Signed)
[place] [facility] AT [facility] Provider Note   CSN: [id number] Arrival date & time: 04/17/23  1857     History  Chief Complaint  Patient presents with   Sore Throat    Raynette Arras is a 22 y.o. female otherwise healthy here presenting with sore throat.  Patient has sore throat for several days.  Went to urgent care 2 days ago and had negative COVID and flu and strep test.  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  She was told to use salt water gargles and he will pass on its own.  She states that for the last 2 days she has persistent chills.  Also has worsening sore throat.  The history is provided by the patient.       Home Medications Prior to Admission medications   Medication Sig Start Date End Date Taking? Authorizing Provider  nitrofurantoin, macrocrystal-monohydrate, (MACROBID) 100 MG capsule Take 1 capsule (100 mg total) by mouth 2 (two) times daily. 04/16/23   [name], DO  phenazopyridine (PYRIDIUM) 200 MG tablet Take 1 tablet (200 mg total) by mouth 3 (three) times daily as needed for pain (urethral spasm). 04/16/23   [name], DO      Allergies    Dust mite extract, Mixed ragweed, and Venofer [iron sucrose]    Review of Systems   Review of Systems  HENT:  Positive for sore throat.   All other systems reviewed and are negative.   Physical Exam Updated Vital Signs BP 133/82 (BP Location: Left Arm)   Pulse 100   Temp 99 F (37.2 C) (Oral)   Resp 18   Ht 5\' 5"  (1.651 m)   Wt 84.8 kg   LMP 04/17/2023 (Exact Date)   SpO2 100%   BMI 31.11 kg/m  Physical Exam Vitals and nursing note reviewed.  Constitutional:      Appearance: She is well-developed.  HENT:     Head: Normocephalic.     Mouth/Throat:     Tonsils: Tonsillar abscess present.     Comments: Right tonsillar exudate Neck:     Comments: Right cervical lymphadenopathy Cardiovascular:     Rate and Rhythm: Normal rate and regular rhythm.      Heart sounds: Normal heart sounds.  Pulmonary:     Effort: Pulmonary effort is normal.     Breath sounds: Normal breath sounds.  Abdominal:     General: Bowel sounds are normal.     Palpations: Abdomen is soft.  Musculoskeletal:     Cervical back: Normal range of motion and neck supple.  Skin:    General: Skin is warm.  Neurological:     General: No focal deficit present.     Mental Status: She is alert and oriented to person, place, and time.  Psychiatric:        Mood and Affect: Mood normal.        Behavior: Behavior normal.     ED Results / Procedures / Treatments   Labs (all labs ordered are listed, but only abnormal results are displayed) Labs Reviewed - No data to display  EKG None  Radiology No results found.  Procedures Procedures    Medications Ordered in ED Medications  amoxicillin-clavulanate (AUGMENTIN) 875-125 MG per tablet 1 tablet (has no administration in time range)    ED Course/ Medical Decision Making/ A&P  Medical Decision Making Isaac Lacson is a 22 y.o. female here presenting with sore throat.  Patient had right tonsillar exudate on exam and cervical lymphadenopathy and chills.  I am concerned that she has tonsillitis and not just tonsillar stones.  Will give a course of Augmentin empirically.  She has no evidence of peritonsillar abscess   Problems Addressed: Tonsillitis: acute illness or injury  Risk Prescription drug management.    Final Clinical Impression(s) / ED Diagnoses Final diagnoses:  None    Rx / DC Orders ED Discharge Orders     None         Charlynne Pander, MD 04/17/23 2010

## 2023-04-17 NOTE — Discharge Instructions (Addendum)
You have tonsillitis.  Take Augmentin twice daily for a week  Continue Tylenol or Motrin for sore throat and fever  See your doctor for follow-up  Return to ER if you have worse sore throat, fever, trouble swallowing

## 2023-04-17 NOTE — Telephone Encounter (Signed)
Chief Complaint: Sore Throat  Symptoms: Sore throat 9/10, told it was a tonsil stone at urgent care, chills, low grade fever, difficulty swallowing  Frequency: constant  Pertinent Negatives: Patient denies chest pain  Disposition: [x] ED /[] Urgent Care (no appt availability in office) / [] Appointment(In office/virtual)/ []  [facility]/ [] Home Care/ [] Refused Recommended Disposition /[] [facility]/ []  Follow-up with PCP Additional Notes: Patient stated she went to [facility] urgent care on Wednesday and was told she had a tonsil stone. Patient stated she was advised to gargling with salt water and if symptoms get worse to seek care. Patient stated she went to a [place] urgent care today and was advised to go to the ED for assessment. Patient calling for additional advice. Care advise was given and patient advised again to go to the ED as recommended from Urgent Care. Patient plans to go to [facility] for care.   Reason for Disposition  Patient sounds very sick or weak to the triager  Answer Assessment - Initial Assessment Questions 1. ONSET: "When did the throat start hurting?" (Hours or days ago)      Wednesday this week  2. SEVERITY: "How bad is the sore throat?" (Scale 1-10; mild, moderate or severe)   - MILD (1-3):  Doesn't interfere with eating or normal activities.   - MODERATE (4-7): Interferes with eating some solids and normal activities.   - SEVERE (8-10):  Excruciating pain, interferes with most normal activities.   - SEVERE WITH DYSPHAGIA (10): Can't swallow liquids, drooling.     9/10 swallowing is very hard to do  3. STREP EXPOSURE: "Has there been any exposure to strep within the past week?" If Yes, ask: "What type of contact occurred?"      No  4.  VIRAL SYMPTOMS: "Are there any symptoms of a cold, such as a runny nose, cough, hoarse voice or red eyes?"      Chills, hot flashes, mild cough, feels like something is stuck in my throat  5. FEVER: "Do  you have a fever?" If Yes, ask: "What is your temperature, how was it measured, and when did it start?"     99.7 today  6. PUS ON THE TONSILS: "Is there pus on the tonsils in the back of your throat?"     No, but it is really red 7. OTHER SYMPTOMS: "Do you have any other symptoms?" (e.g., difficulty breathing, headache, rash)     Rash on the back of the neck  Protocols used: Sore Throat-A-AH

## 2023-04-17 NOTE — Progress Notes (Signed)
Chart reviewed - agree with CMA/RN documentation.  ° °

## 2023-04-18 LAB — URINE CULTURE: Organism ID, Bacteria: NO GROWTH

## 2023-04-20 ENCOUNTER — Emergency Department (HOSPITAL_BASED_OUTPATIENT_CLINIC_OR_DEPARTMENT_OTHER): Payer: Medicaid Other

## 2023-04-20 ENCOUNTER — Other Ambulatory Visit: Payer: Self-pay

## 2023-04-20 ENCOUNTER — Encounter (HOSPITAL_BASED_OUTPATIENT_CLINIC_OR_DEPARTMENT_OTHER): Payer: Self-pay | Admitting: Emergency Medicine

## 2023-04-20 ENCOUNTER — Emergency Department (HOSPITAL_BASED_OUTPATIENT_CLINIC_OR_DEPARTMENT_OTHER)
Admission: EM | Admit: 2023-04-20 | Discharge: 2023-04-20 | Disposition: A | Discharge status: Home / Self Care | Payer: Medicaid Other | Attending: Emergency Medicine | Admitting: Emergency Medicine

## 2023-04-20 DIAGNOSIS — R0981 Nasal congestion: Secondary | ICD-10-CM | POA: Insufficient documentation

## 2023-04-20 DIAGNOSIS — R070 Pain in throat: Secondary | ICD-10-CM | POA: Diagnosis present

## 2023-04-20 DIAGNOSIS — R059 Cough, unspecified: Secondary | ICD-10-CM | POA: Insufficient documentation

## 2023-04-20 DIAGNOSIS — J029 Acute pharyngitis, unspecified: Secondary | ICD-10-CM | POA: Diagnosis not present

## 2023-04-20 LAB — CBC
HCT: 34.8 % — ABNORMAL LOW (ref 36.0–46.0)
Hemoglobin: 11.4 g/dL — ABNORMAL LOW (ref 12.0–15.0)
MCH: 30.1 pg (ref 26.0–34.0)
MCHC: 32.8 g/dL (ref 30.0–36.0)
MCV: 91.8 fL (ref 80.0–100.0)
Platelets: 241 10*3/uL (ref 150–400)
RBC: 3.79 MIL/uL — ABNORMAL LOW (ref 3.87–5.11)
RDW: 12.2 % (ref 11.5–15.5)
WBC: 5.3 10*3/uL (ref 4.0–10.5)
nRBC: 0 % (ref 0.0–0.2)

## 2023-04-20 LAB — BASIC METABOLIC PANEL
Anion gap: 8 (ref 5–15)
BUN: 9 mg/dL (ref 6–20)
CO2: 24 mmol/L (ref 22–32)
Calcium: 9.1 mg/dL (ref 8.9–10.3)
Chloride: 106 mmol/L (ref 98–111)
Creatinine, Ser: 0.69 mg/dL (ref 0.44–1.00)
GFR, Estimated: >60 mL/min (ref >60)
Glucose, Bld: 87 mg/dL (ref 70–99)
Potassium: 3.7 mmol/L (ref 3.5–5.1)
Sodium: 138 mmol/L (ref 135–145)

## 2023-04-20 LAB — HCG, SERUM, QUALITATIVE: Preg, Serum: NEGATIVE

## 2023-04-20 MED ORDER — DEXAMETHASONE SODIUM PHOSPHATE 10 MG/ML IJ SOLN
10.0000 mg | Freq: Once | INTRAMUSCULAR | Status: AC
  Administered 2023-04-20: 10 mg via INTRAVENOUS
  Filled 2023-04-20: qty 1

## 2023-04-20 MED ORDER — IOHEXOL 300 MG/ML  SOLN
100.0000 mL | Freq: Once | INTRAMUSCULAR | Status: AC | PRN
  Administered 2023-04-20: 75 mL via INTRAVENOUS

## 2023-04-20 NOTE — Discharge Instructions (Signed)
Your CT scan read is not back yet, I will call you if there is a concerning result.  I recommend you finish the antibiotics, you can take Tylenol, Motrin for pain.  If you develop difficulty swallowing, severe pain or difficulty breathing you should return to the ED.

## 2023-04-20 NOTE — ED Triage Notes (Signed)
Was treated for tonsillitis 3 days ago , on antibiotics , yet persistent sore throat and fever , she said wants to be revaluated again .

## 2023-04-20 NOTE — ED Provider Notes (Signed)
[place] [facility] AT [facility] Provider Note   CSN: [id number] Arrival date & time: 04/20/23  7673     History {Add pertinent medical, surgical, social history, OB history to HPI:1} Chief Complaint  Patient presents with  . Sore Throat    Alexis [name] is a 22 y.o. female.   Sore Throat  22 year old female presenting for sore throat.  Patient was seen here few days ago for sore throat.  Was worse on the right at the time.  She started Augmentin which she has been taking but she feels like her symptoms are worsening.  She is continued pain across her throat and some pain with swallowing but is able to swallow.  No shortness of breath.  No fevers or chills in the last couple days.  No swelling under her neck or chin.  She has developed a mild cough and congestion.  No shortness of breath or chest pain.     Home Medications Prior to Admission medications   Medication Sig Start Date End Date Taking? Authorizing Provider  amoxicillin-clavulanate (AUGMENTIN) 875-125 MG tablet Take 1 tablet by mouth every 12 (twelve) hours. 04/17/23   Charlynne Pander, MD  nitrofurantoin, macrocrystal-monohydrate, (MACROBID) 100 MG capsule Take 1 capsule (100 mg total) by mouth 2 (two) times daily. 04/16/23   [name], DO  phenazopyridine (PYRIDIUM) 200 MG tablet Take 1 tablet (200 mg total) by mouth 3 (three) times daily as needed for pain (urethral spasm). 04/16/23   [name], DO      Allergies    Dust mite extract, Mixed ragweed, and Venofer [iron sucrose]    Review of Systems   Review of Systems Review of systems completed and notable as per HPI.  ROS otherwise negative.   Physical Exam Updated Vital Signs BP (!) 141/93   Pulse 84   Temp 98.4 F (36.9 C)   Resp 14   Wt 84.8 kg   LMP 04/17/2023 (Exact Date)   SpO2 100%   BMI 31.11 kg/m  Physical Exam Vitals and nursing note reviewed.  Constitutional:      General: She is not in  acute distress.    Appearance: She is well-developed.  HENT:     Head: Normocephalic and atraumatic.     Mouth/Throat:     Mouth: Mucous membranes are moist.     Pharynx: Posterior oropharyngeal erythema present. No pharyngeal swelling or oropharyngeal exudate.     Tonsils: No tonsillar abscesses.  Eyes:     Conjunctiva/sclera: Conjunctivae normal.     Pupils: Pupils are equal, round, and reactive to light.  Neck:     Thyroid: No thyromegaly.  Cardiovascular:     Rate and Rhythm: Normal rate and regular rhythm.     Heart sounds: No murmur heard. Pulmonary:     Effort: Pulmonary effort is normal. No respiratory distress.     Breath sounds: Normal breath sounds.  Abdominal:     Palpations: Abdomen is soft.     Tenderness: There is no abdominal tenderness.  Musculoskeletal:        General: No swelling.     Cervical back: Neck supple.  Lymphadenopathy:     Cervical: No cervical adenopathy.  Skin:    General: Skin is warm and dry.     Capillary Refill: Capillary refill takes less than 2 seconds.  Neurological:     Mental Status: She is alert.  Psychiatric:        Mood and Affect:  Mood normal.     ED Results / Procedures / Treatments   Labs (all labs ordered are listed, but only abnormal results are displayed) Labs Reviewed - No data to display  EKG None  Radiology No results found.  Procedures Procedures  {Document cardiac monitor, telemetry assessment procedure when appropriate:1}  Medications Ordered in ED Medications - No data to display  ED Course/ Medical Decision Making/ A&P Clinical Course as of 04/20/23 1937  Mon Apr 20, 2023  1139 On reassessment patient needs to leave.  I reviewed her CT scan outstanding obvious but they discussed we could be missing potential PTA or other serious pathology.  I told her I will call her with the results. [JD]    Clinical Course User Index [JD] Laurence Spates, MD   {   Click here for ABCD2, HEART and other  calculatorsREFRESH Note before signing :1}                              Medical Decision Making Amount and/or Complexity of Data Reviewed Labs: ordered. Radiology: ordered.  Risk Prescription drug management.   Medical Decision Making:   Alexis [name] is a 22 y.o. female who presented to the ED today with worsening sore throat.  Vital signs reviewed.  Exam she is well-appearing, no signs of systemic illness or sepsis.  Her throat exam looks quite reassuring, I do not see any clear peritonsillar abscess.  She is no trismus, no signs of RPA or epiglottitis.  Handling secretions okay.  She is describing worsening pain with swallowing or worsening symptoms and she wants to rule out a peritonsillar abscess.  Obtain CT scan to evaluate.  I wonder if she may have viral infection given her development of cough, congestion and lack of improvement on antibiotics.   Patient placed on continuous vitals and telemetry monitoring while in ED which was reviewed periodically.  Reviewed and confirmed nursing documentation for past medical history, family history, social history.  Reassessment and Plan:   On reassessment patient needs to leave.  I reviewed her CT scan, no obvious large abscess but we discussed we could be missing potential PTA or other serious pathology.  I recommend she follow up with her PCP and return for worsening symptoms.  I will follow up with results of the CT.   Patient's presentation is most consistent with {EM COPA:27473}     {Document critical care time when appropriate:1} {Document review of labs and clinical decision tools ie heart score, Chads2Vasc2 etc:1}  {Document your independent review of radiology images, and any outside records:1} {Document your discussion with family members, caretakers, and with consultants:1} {Document social determinants of health affecting pt's care:1} {Document your decision making why or why not admission, treatments were  needed:1} Final Clinical Impression(s) / ED Diagnoses Final diagnoses:  None    Rx / DC Orders ED Discharge Orders     None

## 2023-04-22 ENCOUNTER — Telehealth: Payer: Self-pay | Admitting: *Deleted

## 2023-04-22 NOTE — Transitions of Care (Post Inpatient/ED Visit) (Deleted)
   04/22/2023  Name: Alexis [name] MRN: [id number] DOB: 19-Aug-2000  Today's TOC FU Call Status:    Attempted to reach the patient regarding the most recent Inpatient/ED visit.  Follow Up Plan: Additional outreach attempts will be made to reach the patient to complete the Transitions of Care (Post Inpatient/ED visit) call.   Signature ***

## 2023-04-22 NOTE — Transitions of Care (Post Inpatient/ED Visit) (Cosign Needed)
   04/22/2023  Name: Alexis [name] MRN: [id number] DOB: 07-05-2000  Today's TOC FU Call Status: Today's TOC FU Call Status:: Successful TOC FU Call Completed TOC FU Call Complete Date: 04/20/23 Patient's Name and Date of Birth confirmed.  Transition Care Management Follow-up Telephone Call Date of Discharge: 04/20/23 Discharge Facility: [facility] Type of Discharge: Emergency Department Reason for ED Visit: Other: (Pharyngitis) How have you been since you were released from the hospital?: Better Any questions or concerns?: No  Items Reviewed: Did you receive and understand the discharge instructions provided?: Yes Medications obtained,verified, and reconciled?: Yes (Medications Reviewed) Any new allergies since your discharge?: No Dietary orders reviewed?: NA Do you have support at home?: Yes People in Home: child(ren), dependent Name of Support/Comfort Primary Source: N/a  Medications Reviewed Today: Medications Reviewed Today     Reviewed by Juel Burrow, CMA (Certified Medical Assistant) on 04/22/23 at 1150  Med List Status: <None>   Medication Order Taking? Sig Documenting Provider Last Dose Status Informant  amoxicillin-clavulanate (AUGMENTIN) 875-125 MG tablet [id number]  Take 1 tablet by mouth every 12 (twelve) hours. Charlynne Pander, MD  Active             Home Care and Equipment/Supplies: Were Home Health Services Ordered?: NA Any new equipment or medical supplies ordered?: NA  Functional Questionnaire: Do you need assistance with bathing/showering or dressing?: No Do you need assistance with meal preparation?: No Do you need assistance with eating?: No Do you have difficulty maintaining continence: No Do you need assistance with getting out of bed/getting out of a chair/moving?: No Do you have difficulty managing or taking your medications?: No  Follow up appointments reviewed: PCP Follow-up appointment confirmed?: Yes Date of  PCP follow-up appointment?: 04/27/23 Follow-up Provider: Hyman Hopes, NP Specialist Hospital Follow-up appointment confirmed?: NA Do you need transportation to your follow-up appointment?: No Do you understand care options if your condition(s) worsen?: Yes-patient verbalized understanding    Donne Anon, CMA

## 2023-04-27 ENCOUNTER — Inpatient Hospital Stay: Payer: Medicaid Other | Admitting: Family Medicine

## 2023-05-01 ENCOUNTER — Encounter (HOSPITAL_BASED_OUTPATIENT_CLINIC_OR_DEPARTMENT_OTHER): Payer: Self-pay | Admitting: Obstetrics and Gynecology

## 2023-05-01 ENCOUNTER — Other Ambulatory Visit: Payer: Self-pay

## 2023-05-01 NOTE — Progress Notes (Addendum)
Spoke w/ via phone for pre-op interview---pt Lab needs dos----  t & s, urine preg       Lab results------ COVID test -----patient states asymptomatic no test needed Arrive at -------1230 05-05-2023 NPO after MN NO Solid Food.  Clear liquids from MN until---1130 Med rec completed Medications to take morning of surgery -----none Diabetic medication -----n/a Patient instructed no nail polish to be worn day of surgery Patient instructed to bring photo id and insurance card day of surgery Patient aware to have Driver (ride ) / caregiver    for 24 hours after surgery  mother Alexis [name]-  Patient Special Instructions -----none Pre-Op special Instructions -----none Patient verbalized understanding of instructions that were given at this phone interview. Patient denies chest pain, sob, fever, cough at the interview.   Loc cardiology ( sinus tachacardia) dr Roderick Pee Darryl Nestle 09-14-2020 epic

## 2023-05-05 ENCOUNTER — Other Ambulatory Visit: Payer: Self-pay

## 2023-05-05 ENCOUNTER — Ambulatory Visit (HOSPITAL_BASED_OUTPATIENT_CLINIC_OR_DEPARTMENT_OTHER): Payer: Medicaid Other | Admitting: Anesthesiology

## 2023-05-05 ENCOUNTER — Encounter (HOSPITAL_BASED_OUTPATIENT_CLINIC_OR_DEPARTMENT_OTHER)
Admission: RE | Disposition: A | Discharge status: Home / Self Care | Payer: Self-pay | Source: Home / Self Care | Attending: Obstetrics and Gynecology

## 2023-05-05 ENCOUNTER — Ambulatory Visit (HOSPITAL_BASED_OUTPATIENT_CLINIC_OR_DEPARTMENT_OTHER)
Admission: RE | Admit: 2023-05-05 | Discharge: 2023-05-05 | Disposition: A | Discharge status: Home / Self Care | Payer: Medicaid Other | Attending: Obstetrics and Gynecology | Admitting: Obstetrics and Gynecology

## 2023-05-05 ENCOUNTER — Encounter (HOSPITAL_BASED_OUTPATIENT_CLINIC_OR_DEPARTMENT_OTHER): Payer: Self-pay | Admitting: Obstetrics and Gynecology

## 2023-05-05 DIAGNOSIS — Z302 Encounter for sterilization: Secondary | ICD-10-CM

## 2023-05-05 DIAGNOSIS — K219 Gastro-esophageal reflux disease without esophagitis: Secondary | ICD-10-CM | POA: Insufficient documentation

## 2023-05-05 DIAGNOSIS — Z3009 Encounter for other general counseling and advice on contraception: Secondary | ICD-10-CM | POA: Diagnosis present

## 2023-05-05 DIAGNOSIS — Z01818 Encounter for other preprocedural examination: Secondary | ICD-10-CM

## 2023-05-05 HISTORY — PX: LAPAROSCOPIC BILATERAL SALPINGECTOMY: SHX5889

## 2023-05-05 HISTORY — DX: Tachycardia, unspecified: R00.0

## 2023-05-05 LAB — TYPE AND SCREEN
ABO/RH(D): O POS
Antibody Screen: POSITIVE

## 2023-05-05 LAB — POCT PREGNANCY, URINE: Preg Test, Ur: NEGATIVE

## 2023-05-05 SURGERY — SALPINGECTOMY, BILATERAL, LAPAROSCOPIC
Anesthesia: General | Site: Pelvis | Laterality: Bilateral

## 2023-05-05 MED ORDER — FENTANYL CITRATE (PF) 250 MCG/5ML IJ SOLN
INTRAMUSCULAR | Status: DC | PRN
  Administered 2023-05-05 (×3): 50 ug via INTRAVENOUS

## 2023-05-05 MED ORDER — PROPOFOL 10 MG/ML IV BOLUS
INTRAVENOUS | Status: DC | PRN
  Administered 2023-05-05: 200 mg via INTRAVENOUS

## 2023-05-05 MED ORDER — PROPOFOL 500 MG/50ML IV EMUL
INTRAVENOUS | Status: AC
  Filled 2023-05-05: qty 50

## 2023-05-05 MED ORDER — PROPOFOL 10 MG/ML IV BOLUS
INTRAVENOUS | Status: AC
  Filled 2023-05-05: qty 20

## 2023-05-05 MED ORDER — IBUPROFEN 800 MG PO TABS: 800.0000 mg | ORAL_TABLET | Freq: Three times a day (TID) | ORAL | 0 refills | Status: DC | PRN

## 2023-05-05 MED ORDER — OXYCODONE HCL 5 MG PO TABS
5.0000 mg | ORAL_TABLET | Freq: Once | ORAL | Status: AC | PRN
  Administered 2023-05-05: 5 mg via ORAL

## 2023-05-05 MED ORDER — OXYCODONE HCL 5 MG/5ML PO SOLN: 5.0000 mg | Freq: Once | ORAL | Status: AC | PRN

## 2023-05-05 MED ORDER — ROCURONIUM BROMIDE 10 MG/ML (PF) SYRINGE
PREFILLED_SYRINGE | INTRAVENOUS | Status: DC | PRN
  Administered 2023-05-05: 50 mg via INTRAVENOUS

## 2023-05-05 MED ORDER — ACETAMINOPHEN 500 MG PO TABS: 1000.0000 mg | ORAL_TABLET | ORAL | Status: DC

## 2023-05-05 MED ORDER — SODIUM CHLORIDE 0.9 % IV SOLN: INTRAVENOUS | Status: DC

## 2023-05-05 MED ORDER — PROPOFOL 1000 MG/100ML IV EMUL
INTRAVENOUS | Status: AC
  Filled 2023-05-05: qty 100

## 2023-05-05 MED ORDER — DROPERIDOL 2.5 MG/ML IJ SOLN: 0.6250 mg | Freq: Once | INTRAMUSCULAR | Status: DC | PRN

## 2023-05-05 MED ORDER — POVIDONE-IODINE 10 % EX SWAB: 2.0000 | Freq: Once | CUTANEOUS | Status: DC

## 2023-05-05 MED ORDER — OXYCODONE HCL 5 MG PO TABS: 5.0000 mg | ORAL_TABLET | ORAL | 0 refills | Status: DC | PRN

## 2023-05-05 MED ORDER — LIDOCAINE 2% (20 MG/ML) 5 ML SYRINGE
INTRAMUSCULAR | Status: DC | PRN
  Administered 2023-05-05: 60 mg via INTRAVENOUS

## 2023-05-05 MED ORDER — OXYCODONE HCL 5 MG PO TABS
ORAL_TABLET | ORAL | Status: AC
  Filled 2023-05-05: qty 1

## 2023-05-05 MED ORDER — HYDROMORPHONE HCL 1 MG/ML IJ SOLN
INTRAMUSCULAR | Status: AC
  Filled 2023-05-05: qty 1

## 2023-05-05 MED ORDER — DEXMEDETOMIDINE HCL IN NACL 80 MCG/20ML IV SOLN
INTRAVENOUS | Status: DC | PRN
  Administered 2023-05-05 (×2): 8 ug via INTRAVENOUS

## 2023-05-05 MED ORDER — CELECOXIB 200 MG PO CAPS
200.0000 mg | ORAL_CAPSULE | Freq: Once | ORAL | Status: AC
  Administered 2023-05-05: 200 mg via ORAL

## 2023-05-05 MED ORDER — MIDAZOLAM HCL 2 MG/2ML IJ SOLN
INTRAMUSCULAR | Status: AC
Start: 2023-05-05 — End: ?
  Filled 2023-05-05: qty 2

## 2023-05-05 MED ORDER — HYDROMORPHONE HCL 1 MG/ML IJ SOLN
0.2500 mg | INTRAMUSCULAR | Status: DC | PRN
  Administered 2023-05-05: 0.25 mg via INTRAVENOUS
  Administered 2023-05-05: 0.5 mg via INTRAVENOUS

## 2023-05-05 MED ORDER — ONDANSETRON 4 MG PO TBDP: 4.0000 mg | ORAL_TABLET | Freq: Four times a day (QID) | ORAL | 0 refills | Status: DC | PRN

## 2023-05-05 MED ORDER — ACETAMINOPHEN 500 MG PO TABS
ORAL_TABLET | ORAL | Status: AC
  Filled 2023-05-05: qty 2

## 2023-05-05 MED ORDER — MIDAZOLAM HCL 2 MG/2ML IJ SOLN
INTRAMUSCULAR | Status: DC | PRN
  Administered 2023-05-05: 2 mg via INTRAVENOUS

## 2023-05-05 MED ORDER — LACTATED RINGERS IV SOLN: INTRAVENOUS | Status: DC

## 2023-05-05 MED ORDER — CELECOXIB 200 MG PO CAPS
ORAL_CAPSULE | ORAL | Status: AC
  Filled 2023-05-05: qty 1

## 2023-05-05 MED ORDER — FENTANYL CITRATE (PF) 100 MCG/2ML IJ SOLN
INTRAMUSCULAR | Status: AC
  Filled 2023-05-05: qty 2

## 2023-05-05 MED ORDER — ACETAMINOPHEN 500 MG PO TABS
1000.0000 mg | ORAL_TABLET | Freq: Once | ORAL | Status: AC
  Administered 2023-05-05: 1000 mg via ORAL

## 2023-05-05 MED ORDER — DEXAMETHASONE SODIUM PHOSPHATE 10 MG/ML IJ SOLN
INTRAMUSCULAR | Status: DC | PRN
  Administered 2023-05-05: 10 mg via INTRAVENOUS

## 2023-05-05 MED ORDER — 0.9 % SODIUM CHLORIDE (POUR BTL) OPTIME
TOPICAL | Status: DC | PRN
  Administered 2023-05-05: 500 mL

## 2023-05-05 MED ORDER — SUGAMMADEX SODIUM 200 MG/2ML IV SOLN
INTRAVENOUS | Status: DC | PRN
  Administered 2023-05-05: 350 mg via INTRAVENOUS

## 2023-05-05 MED ORDER — ONDANSETRON HCL 4 MG/2ML IJ SOLN
INTRAMUSCULAR | Status: DC | PRN
  Administered 2023-05-05: 4 mg via INTRAVENOUS

## 2023-05-05 MED ORDER — BUPIVACAINE HCL (PF) 0.25 % IJ SOLN
INTRAMUSCULAR | Status: DC | PRN
  Administered 2023-05-05: 10 mL

## 2023-05-05 SURGICAL SUPPLY — 29 items
CHLORAPREP W/TINT 26 (MISCELLANEOUS) ×2 IMPLANT
COVER MAYO STAND STRL (DRAPES) ×2 IMPLANT
DERMABOND ADVANCED .7 DNX12 (GAUZE/BANDAGES/DRESSINGS) ×2 IMPLANT
DRAPE LAPAROSCOPIC ABDOMINAL (DRAPES) ×2 IMPLANT
DRAPE SURG IRRIG POUCH 19X23 (DRAPES) ×2 IMPLANT
GLOVE BIO SURGEON STRL SZ 6 (GLOVE) ×2 IMPLANT
GLOVE BIOGEL PI IND STRL 6.5 (GLOVE) IMPLANT
GLOVE BIOGEL PI IND STRL 7.0 (GLOVE) IMPLANT
GOWN STRL REUS W/TWL LRG LVL3 (GOWN DISPOSABLE) ×2 IMPLANT
IRRIG SUCT STRYKERFLOW 2 WTIP (MISCELLANEOUS)
IRRIGATION SUCT STRKRFLW 2 WTP (MISCELLANEOUS) ×2 IMPLANT
KIT PINK PAD W/HEAD ARE REST (MISCELLANEOUS) ×1
KIT PINK PAD W/HEAD ARM REST (MISCELLANEOUS) ×2 IMPLANT
KIT TURNOVER CYSTO (KITS) ×2 IMPLANT
LIGASURE VESSEL 5MM BLUNT TIP (ELECTROSURGICAL) IMPLANT
NDL INSUFFLATION 14GA 120MM (NEEDLE) ×2 IMPLANT
NEEDLE INSUFFLATION 14GA 120MM (NEEDLE) ×1 IMPLANT
PACK LAPAROSCOPY BASIN (CUSTOM PROCEDURE TRAY) ×2 IMPLANT
SEALER TISSUE G2 CVD JAW 35 (ENDOMECHANICALS) IMPLANT
SET IRRIG Y TYPE TUR BLADDER L (SET/KITS/TRAYS/PACK) IMPLANT
SET TUBE SMOKE EVAC HIGH FLOW (TUBING) ×2 IMPLANT
SLEEVE SCD COMPRESS KNEE MED (STOCKING) ×2 IMPLANT
SLEEVE Z-THREAD 5X100MM (TROCAR) ×2 IMPLANT
SUT MNCRL AB 4-0 PS2 18 (SUTURE) ×2 IMPLANT
TOWEL OR 17X24 6PK STRL BLUE (TOWEL DISPOSABLE) ×2 IMPLANT
TRAY FOLEY W/BAG SLVR 14FR LF (SET/KITS/TRAYS/PACK) ×2 IMPLANT
TROCAR KII 8X100ML NONTHREADED (TROCAR) ×2 IMPLANT
TROCAR Z-THREAD FIOS 5X100MM (TROCAR) ×2 IMPLANT
WARMER LAPAROSCOPE (MISCELLANEOUS) ×2 IMPLANT

## 2023-05-05 NOTE — Discharge Instructions (Signed)
     No acetaminophen/Tylenol until after 6:30 pm today if needed.     Post Anesthesia Home Care Instructions  Activity: Get plenty of rest for the remainder of the day. A responsible individual must stay with you for 24 hours following the procedure.  For the next 24 hours, DO NOT: -Drive a car -Advertising copywriter -Drink alcoholic beverages -Take any medication unless instructed by your physician -Make any legal decisions or sign important papers.  Meals: Start with liquid foods such as gelatin or soup. Progress to regular foods as tolerated. Avoid greasy, spicy, heavy foods. If nausea and/or vomiting occur, drink only clear liquids until the nausea and/or vomiting subsides. Call your physician if vomiting continues.  Special Instructions/Symptoms: Your throat may feel dry or sore from the anesthesia or the breathing tube placed in your throat during surgery. If this causes discomfort, gargle with warm salt water. The discomfort should disappear within 24 hours.

## 2023-05-05 NOTE — H&P (Signed)
Faculty Practice Obstetrics and Gynecology Attending History and Physical  Alexis [name] is a 22 y.o. [phone] who presented to [facility] today for BTS.   She does not want an IUD and she does not want anything hormonal.    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 She is happy with her children but does not want anymore under any circumstance. She does not want LARC options.   Past Medical History:  Diagnosis Date   Anxiety    GERD (gastroesophageal reflux disease)    History of postpartum hemorrhage, 2022   History of pre-eclampsia 2022   Sinus tachycardia    saw cardiology dr Denzil Magnuson 09-14-2020 epic   Past Surgical History:  Procedure Laterality Date   NO PAST SURGERIES     WISDOM TOOTH EXTRACTION  2017   with sedation   OB History  Gravida Para Term Preterm AB Living  2 2 1 1  0 3  SAB IAB Ectopic Multiple Live Births  0 0 0 1 3    # Outcome Date GA Lbr Len/2nd Weight Sex Type Anes PTL Lv  2 Term 10/06/22 [redacted]w[redacted]d 01:46 / 00:16 3240 g M Vag-Spont EPI  LIV     Birth Comments: WDL  1A Preterm 11/01/20 [redacted]w[redacted]d 04:04 / 01:27 2385 g M Vag-Spont EPI  LIV  1B Preterm 11/01/20 [redacted]w[redacted]d 04:04 / 01:43 2275 g F Vag-Spont EPI  LIV  Patient denies any other pertinent gynecologic issues.  No current facility-administered medications on file prior to encounter.   Current Outpatient Medications on File Prior to Encounter  Medication Sig Dispense Refill   calcium carbonate (TUMS - DOSED IN MG ELEMENTAL CALCIUM) 500 MG chewable tablet Chew 1 tablet by mouth as needed for indigestion or heartburn.     cetirizine (ZYRTEC) 10 MG tablet Take 10 mg by mouth daily.     famotidine (PEPCID) 20 MG tablet Take 20 mg by mouth as needed for heartburn or indigestion.     Allergies  Allergen Reactions   Dust Mite Extract Hives   Mixed Ragweed Hives   Venofer [Iron Sucrose] Other (See Comments)    Chest pain    Social History:   reports that she  has never smoked. She has never used smokeless tobacco. She reports current alcohol use. She reports that she does not use drugs. Family History  Problem Relation Age of Onset   Diabetes Mellitus II Mother    Hypertension Mother    Arthritis Father    Hypertension Father    Stomach cancer Paternal Grandmother    Stomach cancer Maternal Aunt    Breast cancer Neg Hx    Colon cancer Neg Hx    Cervical cancer Neg Hx    Heart disease Neg Hx     Review of Systems: Pertinent items noted in HPI and remainder of comprehensive ROS otherwise negative.  PHYSICAL EXAM: Blood pressure (!) 139/93, pulse 66, temperature 98.2 F (36.8 C), temperature source Oral, resp. rate 17, height 5\' 6"  (1.676 m), weight 83.8 kg, last menstrual period 04/17/2023, SpO2 98%, not currently breastfeeding. CONSTITUTIONAL: Well-developed, well-nourished female in no acute distress.  HENT:  Normocephalic, atraumatic, External right and left ear normal. Oropharynx is clear and moist EYES: Conjunctivae and EOM are normal. Pupils are equal, round, and reactive to light. No scleral icterus.  NECK: Normal range of motion, supple, no masses SKIN: Skin is warm and dry. No rash noted. Not diaphoretic. No  erythema. No pallor. NEUROLOGIC: Alert and oriented to person, place, and time. Normal reflexes, muscle tone coordination. No cranial nerve deficit noted. PSYCHIATRIC: Normal mood and affect. Normal behavior. Normal judgment and thought content. CARDIOVASCULAR: Normal heart rate noted, regular rhythm RESPIRATORY: Effort and breath sounds normal, no problems with respiration noted ABDOMEN: Soft, nontender, nondistended. PELVIC: Not examined MUSCULOSKELETAL: Normal range of motion. No tenderness.  No cyanosis, clubbing, or edema.  2+ distal pulses.  Labs: Results for orders placed or performed during the hospital encounter of 05/05/23 (from the past 336 hour(s))  Pregnancy, urine POC   Collection Time: 05/05/23 12:08 PM   Result Value Ref Range   Preg Test, Ur NEGATIVE NEGATIVE    Assessment:   Active Problems:   Unwanted fertility   Plan: - She desires permanent sterilization. Discussed alternatives including LARC options and vasectomy. She declines these options. She is 100% sure she is done with childbearing under all circumstances.  - Discussed surgery of salpingectomy vs tubal ligation. She would like to do a salpingectomy.  - Risks of surgery include but are not limited to: bleeding, infection, injury to surrounding organs/tissues (i.e. bowel/bladder/ureters), need for additional procedures, wound complications, hospital re-admission, and conversion to open surgery, VTE - Reviewed restrictions and recovery following surgery - Consent reviewed and signed.    Milas Hock, MD, FACOG Obstetrician & Gynecologist, [facility] for [facility], [facility]

## 2023-05-05 NOTE — Anesthesia Preprocedure Evaluation (Addendum)
Anesthesia Evaluation  Patient identified by MRN, date of birth, ID band Patient awake    Reviewed: Allergy & Precautions, NPO status , Patient's Chart, lab work & pertinent test results  Airway Mallampati: II  TM Distance: >3 FB Neck ROM: Full    Dental no notable dental hx.    Pulmonary neg pulmonary ROS   Pulmonary exam normal        Cardiovascular hypertension,  Rhythm:Regular Rate:Normal     Neuro/Psych   Anxiety     negative neurological ROS     GI/Hepatic Neg liver ROS,GERD  ,,  Endo/Other  negative endocrine ROS    Renal/GU negative Renal ROS  Female GU complaint     Musculoskeletal negative musculoskeletal ROS (+)    Abdominal Normal abdominal exam  (+)   Peds  Hematology negative hematology ROS (+)   Anesthesia Other Findings   Reproductive/Obstetrics                             Anesthesia Physical Anesthesia Plan  ASA: 2  Anesthesia Plan: General   Post-op Pain Management: Celebrex PO (pre-op)* and Tylenol PO (pre-op)*   Induction: Intravenous  PONV Risk Score and Plan: 3 and Ondansetron, Dexamethasone, Midazolam and Treatment may vary due to age or medical condition  Airway Management Planned: Mask and Oral ETT  Additional Equipment: None  Intra-op Plan:   Post-operative Plan: Extubation in OR  Informed Consent: I have reviewed the patients History and Physical, chart, labs and discussed the procedure including the risks, benefits and alternatives for the proposed anesthesia with the patient or authorized representative who has indicated his/her understanding and acceptance.     Dental advisory given  Plan Discussed with: CRNA  Anesthesia Plan Comments:        Anesthesia Quick Evaluation

## 2023-05-05 NOTE — Anesthesia Procedure Notes (Signed)
Procedure Name: Intubation Date/Time: 05/05/2023 1:32 PM  Performed by: Dairl Ponder, CRNAPre-anesthesia Checklist: Patient identified, Emergency Drugs available, Suction available and Patient being monitored Patient Re-evaluated:Patient Re-evaluated prior to induction Oxygen Delivery Method: Circle System Utilized Preoxygenation: Pre-oxygenation with 100% oxygen Induction Type: IV induction Ventilation: Mask ventilation without difficulty Laryngoscope Size: Mac and 3 Grade View: Grade I Tube type: Oral Tube size: 7.0 mm Number of attempts: 1 Airway Equipment and Method: Stylet and Oral airway Placement Confirmation: ETT inserted through vocal cords under direct vision, positive ETCO2 and breath sounds checked- equal and bilateral Secured at: 23 cm Tube secured with: Tape Dental Injury: Teeth and Oropharynx as per pre-operative assessment

## 2023-05-05 NOTE — Op Note (Signed)
Alexis [name] PROCEDURE DATE: 05/05/2023   PREOPERATIVE DIAGNOSIS:  Undesired fertility  POSTOPERATIVE DIAGNOSIS:  Undesired fertility  PROCEDURE:  Laparoscopic Bilateral Salpingectomy   SURGEON:  Dr. Milas Hock  ASSISTANT:  None  ANESTHESIA:  General endotracheal, 0.5% bupivacaine 10 cc  COMPLICATIONS:  None immediate.  ESTIMATED BLOOD LOSS:  4 ml.  FLUIDS: 250 ml LR.  URINE OUTPUT:  None, voided just prior to surgery  INDICATIONS: 22 y.o. [id number] with undesired fertility, desires permanent sterilization.  Other forms of contraception were discussed with patient and emphasized alternatives of vasectomy, IUDs and Nexplanon as they have equivalent contraceptive efficacy; she declines all other modalities.  Risks of procedure discussed with patient including permanence of method, risk of regret, bleeding, infection, injury to surrounding organs and need for additional procedures including laparotomy.  Failure risk less than 0.5% with increased risk of ectopic gestation if pregnancy occurs was also discussed with patient.  Written informed consent was obtained.    FINDINGS:  Normal uterus, fallopian tubes, and ovaries. Normal abdominal survey  TECHNIQUE:  The patient was taken to the operating room where general anesthesia was obtained without difficulty.  She was then placed in the dorsal supine position and prepared and draped in sterile fashion.  An adequate time out was performed.   A skin incision was made with the 11 blade scalpel in the umbilicus. I entered her abdominal cavity through the natural umbilical defect. Port placed under direct visualization. Opening pressure was 2.  Pneumoperitoneum achieved to a pressure of 15. Below the point of entry and the pelvis was inspected with no evidence of injury.   The scalpel was then used to make one 5mm incsion in the LLQ and one 8 mm incision suprapubically. Optiview ports were inserted. The ureters were identified  bilaterally. The fallopian tubes were cauterized, cut, and detached from their surrounding pelvic structures with the Ligasure. No bleeding was noted. The specimens were removed from the 8mm port. The pelvis was inspected and was hemostatic. All ports were then withdrawn and the gas drained from abdomen. They were then closed in a subcuticular fashion with 4-0 monocryl and dermabond was placed.  The patient will be discharged to home as per PACU criteria.  Routine postoperative instructions given.    Milas Hock, MD Attending Obstetrician & Gynecologist, [facility] for [facility], [facility]

## 2023-05-05 NOTE — Transfer of Care (Signed)
Immediate Anesthesia Transfer of Care Note  Patient: Alexis [name]  Procedure(s) Performed: LAPAROSCOPIC BILATERAL SALPINGECTOMY (Bilateral: Pelvis)  Patient Location: PACU  Anesthesia Type:General  Level of Consciousness: awake, alert , and oriented  Airway & Oxygen Therapy: Patient Spontanous Breathing  Post-op Assessment: Report given to RN and Post -op Vital signs reviewed and stable  Post vital signs: Reviewed and stable  Last Vitals:  Vitals Value Taken Time  BP 127/81 05/05/23 1426  Temp    Pulse 73 05/05/23 1428  Resp 13 05/05/23 1428  SpO2 100 % 05/05/23 1428  Vitals shown include unfiled device data.  Last Pain:  Vitals:   05/05/23 1158  TempSrc: Oral  PainSc: 0-No pain      Patients Stated Pain Goal: 6 (05/05/23 1158)  Complications: No notable events documented.

## 2023-05-06 ENCOUNTER — Other Ambulatory Visit (HOSPITAL_BASED_OUTPATIENT_CLINIC_OR_DEPARTMENT_OTHER): Payer: Self-pay | Admitting: Obstetrics and Gynecology

## 2023-05-06 DIAGNOSIS — Z3009 Encounter for other general counseling and advice on contraception: Secondary | ICD-10-CM

## 2023-05-06 LAB — SURGICAL PATHOLOGY

## 2023-05-06 MED ORDER — CYCLOBENZAPRINE HCL 5 MG PO TABS: 5.0000 mg | ORAL_TABLET | Freq: Three times a day (TID) | ORAL | 0 refills | Status: DC | PRN

## 2023-05-06 NOTE — Anesthesia Postprocedure Evaluation (Signed)
Anesthesia Post Note  Patient: Alexis [name]  Procedure(s) Performed: LAPAROSCOPIC BILATERAL SALPINGECTOMY (Bilateral: Pelvis)     Patient location during evaluation: PACU Anesthesia Type: General Level of consciousness: awake and alert Pain management: pain level controlled Vital Signs Assessment: post-procedure vital signs reviewed and stable Respiratory status: spontaneous breathing, nonlabored ventilation, respiratory function stable and patient connected to nasal cannula oxygen Cardiovascular status: blood pressure returned to baseline and stable Postop Assessment: no apparent nausea or vomiting Anesthetic complications: no   No notable events documented.  Last Vitals:  Vitals:   05/05/23 1500 05/05/23 1606  BP: 137/84 136/89  Pulse: 64 85  Resp: 13 16  Temp:  36.7 C  SpO2: 100% 100%    Last Pain:  Vitals:   05/06/23 1025  TempSrc:   PainSc: 3                  Tonie Elsey P [name]

## 2023-05-07 ENCOUNTER — Encounter (HOSPITAL_BASED_OUTPATIENT_CLINIC_OR_DEPARTMENT_OTHER): Payer: Self-pay | Admitting: Obstetrics and Gynecology

## 2023-05-11 ENCOUNTER — Encounter: Payer: Self-pay | Admitting: Obstetrics and Gynecology

## 2023-05-11 ENCOUNTER — Telehealth: Payer: Self-pay | Admitting: *Deleted

## 2023-05-11 NOTE — Telephone Encounter (Signed)
Left patient a message to call and schedule 4 week Post Op with Dr. Para March. Surgery was 05/05/2023.

## 2023-05-26 ENCOUNTER — Telehealth: Payer: Self-pay | Admitting: *Deleted

## 2023-05-26 NOTE — Telephone Encounter (Signed)
Returned call from 9:50 AM. Left patient a message to call and schedule with Kisha.

## 2023-05-27 ENCOUNTER — Ambulatory Visit: Payer: Medicaid Other

## 2023-05-28 ENCOUNTER — Ambulatory Visit (INDEPENDENT_AMBULATORY_CARE_PROVIDER_SITE_OTHER): Payer: Medicaid Other | Admitting: Obstetrics and Gynecology

## 2023-05-28 ENCOUNTER — Other Ambulatory Visit (HOSPITAL_COMMUNITY)
Admission: RE | Admit: 2023-05-28 | Discharge: 2023-05-28 | Disposition: A | Discharge status: Home / Self Care | Payer: Medicaid Other | Source: Ambulatory Visit | Attending: Obstetrics and Gynecology | Admitting: Obstetrics and Gynecology

## 2023-05-28 ENCOUNTER — Encounter: Payer: Self-pay | Admitting: Obstetrics and Gynecology

## 2023-05-28 VITALS — BP 135/92 | HR 83 | Ht 66.0 in | Wt 184.0 lb

## 2023-05-28 DIAGNOSIS — N9089 Other specified noninflammatory disorders of vulva and perineum: Secondary | ICD-10-CM

## 2023-05-28 DIAGNOSIS — Z09 Encounter for follow-up examination after completed treatment for conditions other than malignant neoplasm: Secondary | ICD-10-CM | POA: Diagnosis not present

## 2023-05-28 DIAGNOSIS — R300 Dysuria: Secondary | ICD-10-CM

## 2023-05-28 DIAGNOSIS — R102 Pelvic and perineal pain unspecified side: Secondary | ICD-10-CM

## 2023-05-28 DIAGNOSIS — Z113 Encounter for screening for infections with a predominantly sexual mode of transmission: Secondary | ICD-10-CM

## 2023-05-28 LAB — POCT URINALYSIS DIPSTICK
Appearance: NORMAL
Bilirubin, UA: NEGATIVE
Blood, UA: NEGATIVE
Glucose, UA: NEGATIVE
Ketones, UA: NEGATIVE
Leukocytes, UA: NEGATIVE
Nitrite, UA: NEGATIVE
Protein, UA: NEGATIVE
Spec Grav, UA: >=1.03 — AB (ref 1.010–1.025)
Urobilinogen, UA: 0.2 U/dL
pH, UA: 6 (ref 5.0–8.0)

## 2023-05-28 MED ORDER — VALACYCLOVIR HCL 1 G PO TABS: 1000.0000 mg | ORAL_TABLET | Freq: Two times a day (BID) | ORAL | 0 refills | Status: DC

## 2023-05-28 NOTE — Progress Notes (Signed)
GYNECOLOGY OFFICE VISIT NOTE  History:   Alexis [name] is a 22 y.o. [phone] here today for postop check.   Also notes dysuria and pain. She noticed a small bump on her labia. The burning when she urinates is on her perineum. She does note a new sexual partner last week but she has known him for some time. Symptoms began on Monday.   She had a L/S salpingectomy on 11/19 that was without complication.    The following portions of the patient's history were reviewed and updated as appropriate: allergies, current medications, past family history, past medical history, past social history, past surgical history and problem list.   Health Maintenance:   Diagnosis  Date Value Ref Range Status  02/05/2023   Final   - Negative for intraepithelial lesion or malignancy (NILM)   Review of Systems:  Pertinent items noted in HPI and remainder of comprehensive ROS otherwise negative.  Physical Exam:  BP (!) 135/92   Pulse 83   Ht 5\' 6"  (1.676 m)   Wt 184 lb (83.5 kg)   LMP 05/13/2023   Breastfeeding No   BMI 29.70 kg/m  CONSTITUTIONAL: Well-developed, well-nourished female in no acute distress.  HEENT:  Normocephalic, atraumatic. External right and left ear normal. No scleral icterus.  NECK: Normal range of motion, supple, no masses noted on observation SKIN: No rash noted. Not diaphoretic. No erythema. No pallor. MUSCULOSKELETAL: Normal range of motion. No edema noted. NEUROLOGIC: Alert and oriented to person, place, and time. Normal muscle tone coordination. No cranial nerve deficit noted. PSYCHIATRIC: Normal mood and affect. Normal behavior. Normal judgment and thought content.  ABDOMEN: No masses noted. No other overt distention noted.  Incisions c/d/I   PELVIC: Normal appearing external genitalia except 1 mm ulcerated area on left labia and 7 mm ulcerated area in midline aspect of perineum at vaginal introitus; normal urethral meatus; No abnormal discharge noted.  Performed in the presence of a chaperone   Labs and Imaging Results for orders placed or performed in visit on 05/28/23 (from the past week)  POCT Urinalysis Dipstick   Collection Time: 05/28/23  3:37 PM  Result Value Ref Range   Color, UA yellow    Clarity, UA clear    Glucose, UA Negative Negative   Bilirubin, UA neg    Ketones, UA neg    Spec Grav, UA >=1.030 (A) 1.010 - 1.025   Blood, UA neg    pH, UA 6.0 5.0 - 8.0   Protein, UA Negative Negative   Urobilinogen, UA 0.2 0.2 or 1.0 E.U./dL   Nitrite, UA neg    Leukocytes, UA Negative Negative   Appearance normal    Odor none    No results found.  Assessment and Plan:  Alexis [name] was seen today for pelvic pain.  Diagnoses and all orders for this visit:  Pelvic pain Due to ulcerations.   Dysuria Likely not due to UTI but from ulcerations and when urine touches them.  -     Urine Culture -     Cervicovaginal ancillary only( [place]) -     POCT Urinalysis Dipstick  Postop check Healed well, no restrictions.   Labial lesion May be HSV. If HSV, would be primary since several lesions and no prior history. Will do Valtrex at least until culture is back.  -     Herpes simplex virus culture -     valACYclovir (VALTREX) 1000 MG tablet; Take 1 tablet (1,000 mg total) by mouth  2 (two) times daily. Take for ten days.  Routine screening for STI (sexually transmitted infection) Vaginal culture also done for trich/GC/CT and BV/yeast. -     Hepatitis C antibody -     RPR -     HIV Antibody (routine testing w rflx)   Meds ordered this encounter  Medications   valACYclovir (VALTREX) 1000 MG tablet    Sig: Take 1 tablet (1,000 mg total) by mouth 2 (two) times daily. Take for ten days.    Dispense:  20 tablet    Refill:  0     Routine preventative health maintenance measures emphasized. Please refer to After Visit Summary for other counseling recommendations.   Return if symptoms worsen or fail to improve.  Milas Hock,  MD, FACOG Obstetrician & Gynecologist, [facility] for [facility], [facility]

## 2023-05-29 ENCOUNTER — Other Ambulatory Visit: Payer: Self-pay | Admitting: *Deleted

## 2023-05-29 DIAGNOSIS — N9089 Other specified noninflammatory disorders of vulva and perineum: Secondary | ICD-10-CM

## 2023-05-29 LAB — CERVICOVAGINAL ANCILLARY ONLY
Bacterial Vaginitis (gardnerella): NEGATIVE
Candida Glabrata: NEGATIVE
Candida Vaginitis: NEGATIVE
Chlamydia: NEGATIVE
Comment: NEGATIVE
Comment: NEGATIVE
Comment: NEGATIVE
Comment: NEGATIVE
Comment: NEGATIVE
Comment: NORMAL
Neisseria Gonorrhea: NEGATIVE
Trichomonas: NEGATIVE

## 2023-05-29 LAB — RPR: RPR Ser Ql: NONREACTIVE

## 2023-05-29 LAB — HIV ANTIBODY (ROUTINE TESTING W REFLEX): HIV Screen 4th Generation wRfx: NONREACTIVE

## 2023-05-29 LAB — HEPATITIS C ANTIBODY: Hep C Virus Ab: NONREACTIVE

## 2023-05-29 MED ORDER — VALACYCLOVIR HCL 1 G PO TABS: 1000.0000 mg | ORAL_TABLET | Freq: Two times a day (BID) | ORAL | 0 refills | Status: DC

## 2023-05-30 LAB — URINE CULTURE

## 2023-05-31 LAB — HERPES SIMPLEX VIRUS CULTURE

## 2023-06-01 ENCOUNTER — Encounter: Payer: Self-pay | Admitting: Obstetrics and Gynecology

## 2023-06-02 ENCOUNTER — Ambulatory Visit: Payer: Medicaid Other

## 2023-06-02 ENCOUNTER — Ambulatory Visit
Admission: EM | Admit: 2023-06-02 | Discharge: 2023-06-02 | Disposition: A | Discharge status: Home / Self Care | Payer: Medicaid Other | Attending: Internal Medicine | Admitting: Internal Medicine

## 2023-06-02 ENCOUNTER — Ambulatory Visit (INDEPENDENT_AMBULATORY_CARE_PROVIDER_SITE_OTHER): Payer: Medicaid Other

## 2023-06-02 DIAGNOSIS — J208 Acute bronchitis due to other specified organisms: Secondary | ICD-10-CM

## 2023-06-02 MED ORDER — PREDNISONE 20 MG PO TABS
ORAL_TABLET | ORAL | 0 refills | Status: DC
Start: 2023-06-02 — End: 2023-08-24

## 2023-06-02 MED ORDER — PROMETHAZINE-DM 6.25-15 MG/5ML PO SYRP: 5.0000 mL | ORAL_SOLUTION | Freq: Three times a day (TID) | ORAL | 0 refills | Status: DC | PRN

## 2023-06-02 NOTE — ED Triage Notes (Signed)
Pt reports shortness of breath, cough, wheezing and body aches x 1 day. Ibuprofen gibes relief with body aches. Reports she had bronchitis in the past.

## 2023-06-02 NOTE — ED Provider Notes (Signed)
[facility]  Note:  This document was prepared using Conservation officer, historic buildings and may include unintentional dictation errors.  MRN: [id number] DOB: Aug 02, 2000  Subjective:   Alexis [name] is a 22 y.o. female presenting for 1 day history of acute onset shortness of breath, wheezing, productive cough, body pains.  Cough can also elicit chest pain.  Has a history of bronchitis.  Also has a history of sinus tachycardia and SVT.  Tends to avoid albuterol as a result.  Has done well with prednisone however.  No smoking of any kind including cigarettes, cigars, vaping, marijuana use.    No current facility-administered medications for this encounter.  Current Outpatient Medications:    calcium carbonate (TUMS - DOSED IN MG ELEMENTAL CALCIUM) 500 MG chewable tablet, Chew 1 tablet by mouth as needed for indigestion or heartburn. (Patient not taking: Reported on 05/28/2023), Disp: , Rfl:    cetirizine (ZYRTEC) 10 MG tablet, Take 10 mg by mouth daily., Disp: , Rfl:    cyclobenzaprine (FLEXERIL) 5 MG tablet, Take 1-2 tablets (5-10 mg total) by mouth 3 (three) times daily as needed for muscle spasms. (Patient not taking: Reported on 05/28/2023), Disp: 30 tablet, Rfl: 0   famotidine (PEPCID) 20 MG tablet, Take 20 mg by mouth as needed for heartburn or indigestion., Disp: , Rfl:    ibuprofen (ADVIL) 800 MG tablet, Take 1 tablet (800 mg total) by mouth 3 (three) times daily with meals as needed for headache, moderate pain (pain score 4-6) or cramping., Disp: 60 tablet, Rfl: 0   ondansetron (ZOFRAN-ODT) 4 MG disintegrating tablet, Take 1 tablet (4 mg total) by mouth every 6 (six) hours as needed for nausea. (Patient not taking: Reported on 05/28/2023), Disp: 20 tablet, Rfl: 0   oxyCODONE (OXY IR/ROXICODONE) 5 MG immediate release tablet, Take 1 tablet (5 mg total) by mouth every 4 (four) hours as needed for severe pain (pain score 7-10) or breakthrough pain. (Patient  not taking: Reported on 05/28/2023), Disp: 10 tablet, Rfl: 0   valACYclovir (VALTREX) 1000 MG tablet, Take 1 tablet (1,000 mg total) by mouth 2 (two) times daily. Take for ten days., Disp: 20 tablet, Rfl: 0   Allergies  Allergen Reactions   Dust Mite Extract Hives   Mixed Ragweed Hives   Venofer [Iron Sucrose] Other (See Comments)    Chest pain    Past Medical History:  Diagnosis Date   Anxiety    Genital HSV    GERD (gastroesophageal reflux disease)    History of postpartum hemorrhage, 2022   History of pre-eclampsia 2022   Sinus tachycardia    saw cardiology dr Denzil Magnuson 09-14-2020 epic     Past Surgical History:  Procedure Laterality Date   LAPAROSCOPIC BILATERAL SALPINGECTOMY Bilateral 05/05/2023   Procedure: LAPAROSCOPIC BILATERAL SALPINGECTOMY;  Surgeon: Milas Hock, MD;  Location: [facility];  Service: Gynecology;  Laterality: Bilateral;   NO PAST SURGERIES     WISDOM TOOTH EXTRACTION  2017   with sedation    Family History  Problem Relation Age of Onset   Diabetes Mellitus II Mother    Hypertension Mother    Arthritis Father    Hypertension Father    Stomach cancer Paternal Grandmother    Stomach cancer Maternal Aunt    Breast cancer Neg Hx    Colon cancer Neg Hx    Cervical cancer Neg Hx    Heart disease Neg Hx     Social History  Tobacco Use   Smoking status: Never   Smokeless tobacco: Never  Vaping Use   Vaping status: Never Used  Substance Use Topics   Alcohol use: Yes    Comment: occ   Drug use: Never    ROS   Objective:   Vitals: BP (!) 154/89 (BP Location: Left Arm)   Pulse 99   Temp 100.1 F (37.8 C) (Oral)   Resp 18   LMP 05/13/2023 (Exact Date)   SpO2 97%   Breastfeeding No   Physical Exam Constitutional:      General: She is not in acute distress.    Appearance: Normal appearance. She is well-developed. She is not ill-appearing, toxic-appearing or diaphoretic.  HENT:     Head: Normocephalic and  atraumatic.     Nose: Nose normal.     Mouth/Throat:     Mouth: Mucous membranes are moist.  Eyes:     General: No scleral icterus.       Right eye: No discharge.        Left eye: No discharge.     Extraocular Movements: Extraocular movements intact.  Cardiovascular:     Rate and Rhythm: Normal rate and regular rhythm.     Heart sounds: Normal heart sounds. No murmur heard.    No friction rub. No gallop.  Pulmonary:     Effort: Pulmonary effort is normal. No respiratory distress.     Breath sounds: No stridor. Examination of the right-upper field reveals rhonchi. Examination of the left-upper field reveals rhonchi. Examination of the right-middle field reveals rhonchi. Examination of the left-middle field reveals rhonchi. Rhonchi present. No decreased breath sounds, wheezing or rales.  Chest:     Chest wall: No tenderness.  Skin:    General: Skin is warm and dry.  Neurological:     General: No focal deficit present.     Mental Status: She is alert and oriented to person, place, and time.  Psychiatric:        Mood and Affect: Mood normal.        Behavior: Behavior normal.     Assessment and Plan :   PDMP not reviewed this encounter.  1. Acute viral bronchitis    X-ray over-read was pending at time of discharge, recommended follow up with only abnormal results. Otherwise will not call for negative over-read. Patient was in agreement.  Recommended managing for acute viral bronchitis with prednisone, general supportive care.  Counseled patient on potential for adverse effects with medications prescribed/recommended today, ER and return-to-clinic precautions discussed, patient verbalized understanding.    Wallis Bamberg, PA-C 06/02/23 1019

## 2023-06-08 ENCOUNTER — Encounter: Payer: Medicaid Other | Admitting: Obstetrics and Gynecology

## 2023-06-11 ENCOUNTER — Ambulatory Visit
Admission: EM | Admit: 2023-06-11 | Discharge: 2023-06-11 | Disposition: A | Discharge status: Home / Self Care | Payer: Medicaid Other | Attending: Family Medicine | Admitting: Family Medicine

## 2023-06-11 DIAGNOSIS — B9689 Other specified bacterial agents as the cause of diseases classified elsewhere: Secondary | ICD-10-CM | POA: Diagnosis not present

## 2023-06-11 DIAGNOSIS — J208 Acute bronchitis due to other specified organisms: Secondary | ICD-10-CM

## 2023-06-11 MED ORDER — AZITHROMYCIN 250 MG PO TABS
ORAL_TABLET | ORAL | 0 refills | Status: DC
Start: 2023-06-11 — End: 2023-11-02

## 2023-06-11 NOTE — ED Provider Notes (Signed)
[facility]  Note:  This document was prepared using Conservation officer, historic buildings and may include unintentional dictation errors.  MRN: [id number] DOB: 2000/08/15  Subjective:   Alexis [name] is a 22 y.o. female presenting for for ongoing productive cough that elicits chest discomfort. Was seen for and managed viral bronchitis with prednisone on 06/03/2023. Chest x-ray confirmed diagnosis and was negative for pneumonia.  Had very short-term relief but is feeling like her symptoms are worse now including a fever.  No current facility-administered medications for this encounter.  Current Outpatient Medications:    calcium carbonate (TUMS - DOSED IN MG ELEMENTAL CALCIUM) 500 MG chewable tablet, Chew 1 tablet by mouth as needed for indigestion or heartburn. (Patient not taking: Reported on 05/28/2023), Disp: , Rfl:    cetirizine (ZYRTEC) 10 MG tablet, Take 10 mg by mouth daily., Disp: , Rfl:    cyclobenzaprine (FLEXERIL) 5 MG tablet, Take 1-2 tablets (5-10 mg total) by mouth 3 (three) times daily as needed for muscle spasms. (Patient not taking: Reported on 05/28/2023), Disp: 30 tablet, Rfl: 0   famotidine (PEPCID) 20 MG tablet, Take 20 mg by mouth as needed for heartburn or indigestion., Disp: , Rfl:    ibuprofen (ADVIL) 800 MG tablet, Take 1 tablet (800 mg total) by mouth 3 (three) times daily with meals as needed for headache, moderate pain (pain score 4-6) or cramping., Disp: 60 tablet, Rfl: 0   ondansetron (ZOFRAN-ODT) 4 MG disintegrating tablet, Take 1 tablet (4 mg total) by mouth every 6 (six) hours as needed for nausea. (Patient not taking: Reported on 05/28/2023), Disp: 20 tablet, Rfl: 0   oxyCODONE (OXY IR/ROXICODONE) 5 MG immediate release tablet, Take 1 tablet (5 mg total) by mouth every 4 (four) hours as needed for severe pain (pain score 7-10) or breakthrough pain. (Patient not taking: Reported on 05/28/2023), Disp: 10 tablet, Rfl: 0    predniSONE (DELTASONE) 20 MG tablet, Take 2 tablets daily with breakfast., Disp: 10 tablet, Rfl: 0   promethazine-dextromethorphan (PROMETHAZINE-DM) 6.25-15 MG/5ML syrup, Take 5 mLs by mouth 3 (three) times daily as needed for cough., Disp: 200 mL, Rfl: 0   valACYclovir (VALTREX) 1000 MG tablet, Take 1 tablet (1,000 mg total) by mouth 2 (two) times daily. Take for ten days., Disp: 20 tablet, Rfl: 0   Allergies  Allergen Reactions   Dust Mite Extract Hives   Mixed Ragweed Hives   Venofer [Iron Sucrose] Other (See Comments)    Chest pain    Past Medical History:  Diagnosis Date   Anxiety    Genital HSV    GERD (gastroesophageal reflux disease)    History of postpartum hemorrhage, 2022   History of pre-eclampsia 2022   Sinus tachycardia    saw cardiology dr Denzil Magnuson 09-14-2020 epic     Past Surgical History:  Procedure Laterality Date   LAPAROSCOPIC BILATERAL SALPINGECTOMY Bilateral 05/05/2023   Procedure: LAPAROSCOPIC BILATERAL SALPINGECTOMY;  Surgeon: Milas Hock, MD;  Location: [facility];  Service: Gynecology;  Laterality: Bilateral;   NO PAST SURGERIES     WISDOM TOOTH EXTRACTION  2017   with sedation    Family History  Problem Relation Age of Onset   Diabetes Mellitus II Mother    Hypertension Mother    Arthritis Father    Hypertension Father    Stomach cancer Paternal Grandmother    Stomach cancer Maternal Aunt    Breast cancer Neg Hx    Colon cancer Neg Hx  Cervical cancer Neg Hx    Heart disease Neg Hx     Social History   Tobacco Use   Smoking status: Never   Smokeless tobacco: Never  Vaping Use   Vaping status: Never Used  Substance Use Topics   Alcohol use: Yes    Comment: occ   Drug use: Never    ROS   Objective:   Vitals: BP (!) 136/90 (BP Location: Left Arm)   Pulse (!) 110   Temp 100.3 F (37.9 C) (Oral)   Resp 20   LMP 06/05/2023   SpO2 96%   Physical Exam Constitutional:      General: She is not in  acute distress.    Appearance: Normal appearance. She is well-developed. She is not ill-appearing, toxic-appearing or diaphoretic.  HENT:     Head: Normocephalic and atraumatic.     Nose: Nose normal.     Mouth/Throat:     Mouth: Mucous membranes are moist.  Eyes:     General: No scleral icterus.       Right eye: No discharge.        Left eye: No discharge.     Extraocular Movements: Extraocular movements intact.  Cardiovascular:     Rate and Rhythm: Normal rate and regular rhythm.     Heart sounds: Normal heart sounds. No murmur heard.    No friction rub. No gallop.  Pulmonary:     Effort: Pulmonary effort is normal. No respiratory distress.     Breath sounds: No stridor. No wheezing, rhonchi or rales.  Chest:     Chest wall: No tenderness.  Skin:    General: Skin is warm and dry.  Neurological:     General: No focal deficit present.     Mental Status: She is alert and oriented to person, place, and time.  Psychiatric:        Mood and Affect: Mood normal.        Behavior: Behavior normal.    DG Chest 2 View Result Date: 06/02/2023 CLINICAL DATA:  Cough and bronchitis. EXAM: CHEST - 2 VIEW COMPARISON:  X-ray 12/18/2022. FINDINGS: The heart size and mediastinal contours are within normal limits. No consolidation, pneumothorax, effusion or edema. Mild bronchial wall thickening. The visualized skeletal structures are unremarkable. Slight curvature of the spine. IMPRESSION: Mild bronchial wall thickening. Electronically Signed   By: Karen Kays M.D.   On: 06/02/2023 12:10    Assessment and Plan :   PDMP not reviewed this encounter.  1. Acute bacterial bronchitis    Will cover for acute bacterial bronchitis with azithromycin.  Continue with supportive care.  Counseled patient on potential for adverse effects with medications prescribed/recommended today, ER and return-to-clinic precautions discussed, patient verbalized understanding.    Wallis Bamberg, PA-C 06/11/23 1606

## 2023-06-11 NOTE — ED Triage Notes (Signed)
Pt c/o prod cough x 2 weeks-states she was seen last week dx with bronchitis-reports +covid exposure after UC visit-states she is here due to worse and now a fever-NAD-steady gait

## 2023-06-23 ENCOUNTER — Ambulatory Visit: Payer: Medicaid Other | Admitting: Obstetrics and Gynecology

## 2023-06-24 ENCOUNTER — Encounter: Payer: Self-pay | Admitting: Obstetrics and Gynecology

## 2023-06-24 ENCOUNTER — Other Ambulatory Visit (HOSPITAL_COMMUNITY)
Admission: RE | Admit: 2023-06-24 | Discharge: 2023-06-24 | Disposition: A | Discharge status: Home / Self Care | Payer: Medicaid Other | Source: Ambulatory Visit | Attending: Obstetrics and Gynecology | Admitting: Obstetrics and Gynecology

## 2023-06-24 ENCOUNTER — Ambulatory Visit: Payer: Medicaid Other | Admitting: Obstetrics and Gynecology

## 2023-06-24 VITALS — BP 135/81 | HR 85 | Ht 66.0 in | Wt 186.0 lb

## 2023-06-24 DIAGNOSIS — N898 Other specified noninflammatory disorders of vagina: Secondary | ICD-10-CM | POA: Diagnosis present

## 2023-06-24 NOTE — Progress Notes (Signed)
   RETURN GYNECOLOGY VISIT  Subjective:  Alexis [name] is a 23 y.o. [phone] with LMP 06/05/23 presenting  with vaginal itching, white discharge with an odor, and clitoral pain.   These symptoms started about a week ago. She has had a few outbreaks of HSV-2, but is unsure if the current symptoms are related to an outbreak. She attempted treatment with an over-the-counter 3 day Monistat equivalent, but the symptoms did not resolve. This is the second episode of similar symptoms in the past month. She denies recent sexual activity and any changes in soap or detergent. She does not wear tight clothing or douche.  I personally reviewed the following: - Pap 02/05/23 NILM - Cervicovaginal ancillary 02/05/23  +BV - Cervicovaginal ancillary 04/16/23  neg GC/CT/trich/BV/yeast - Cervicovaginal ancillary 05/28/23  neg GC/CT/trich/BV/yeast  Objective:   Vitals:   06/24/23 1552  BP: 135/81  Pulse: 85  Weight: 186 lb (84.4 kg)  Height: 5' 6 (1.676 m)   General:  Alert, oriented and cooperative. Patient is in no acute distress.  Skin: Skin is warm and dry. No rash noted.   Cardiovascular: Normal heart rate noted  Respiratory: Normal respiratory effort, no problems with respiration noted  Abdomen: Soft, non-tender, non-distended   Pelvic: NEFG. No lesions or abnormal discharge noted.   Exam performed in the presence of a chaperone  Assessment and Plan:  Alexis [name] is a 23 y.o. with vaginal itching/discharge  1. Vaginal itching (Primary) Discussed option for suppression if +BV 2x in 6 mo or 3x in 1 year Avoid 1 or 3 day monistat formulations due to potential hypersensitivity rxn Will rx treatment after swab results - Cervicovaginal ancillary only( )  Return if symptoms worsen or fail to improve.  No future appointments.  Kieth JAYSON Carolin, MD

## 2023-06-24 NOTE — Patient Instructions (Signed)
 It was nice meeting you today! You will see your results in the MyChart app by Monday.

## 2023-06-25 LAB — CERVICOVAGINAL ANCILLARY ONLY
Bacterial Vaginitis (gardnerella): NEGATIVE
Candida Glabrata: NEGATIVE
Candida Vaginitis: NEGATIVE
Chlamydia: NEGATIVE
Comment: NEGATIVE
Comment: NEGATIVE
Comment: NEGATIVE
Comment: NEGATIVE
Comment: NEGATIVE
Comment: NORMAL
Neisseria Gonorrhea: NEGATIVE
Trichomonas: NEGATIVE

## 2023-08-05 ENCOUNTER — Encounter (HOSPITAL_BASED_OUTPATIENT_CLINIC_OR_DEPARTMENT_OTHER): Payer: Self-pay

## 2023-08-05 ENCOUNTER — Ambulatory Visit
Admission: EM | Admit: 2023-08-05 | Discharge: 2023-08-05 | Disposition: A | Discharge status: Home / Self Care | Payer: Medicaid Other | Attending: Internal Medicine | Admitting: Internal Medicine

## 2023-08-05 ENCOUNTER — Other Ambulatory Visit: Payer: Self-pay

## 2023-08-05 ENCOUNTER — Emergency Department (HOSPITAL_BASED_OUTPATIENT_CLINIC_OR_DEPARTMENT_OTHER): Payer: Medicaid Other

## 2023-08-05 ENCOUNTER — Encounter: Payer: Self-pay | Admitting: Emergency Medicine

## 2023-08-05 ENCOUNTER — Emergency Department (HOSPITAL_BASED_OUTPATIENT_CLINIC_OR_DEPARTMENT_OTHER)
Admission: EM | Admit: 2023-08-05 | Discharge: 2023-08-05 | Disposition: A | Discharge status: Home / Self Care | Payer: Medicaid Other | Attending: Emergency Medicine | Admitting: Emergency Medicine

## 2023-08-05 DIAGNOSIS — R110 Nausea: Secondary | ICD-10-CM

## 2023-08-05 DIAGNOSIS — N760 Acute vaginitis: Secondary | ICD-10-CM | POA: Diagnosis not present

## 2023-08-05 DIAGNOSIS — B9689 Other specified bacterial agents as the cause of diseases classified elsewhere: Secondary | ICD-10-CM | POA: Diagnosis not present

## 2023-08-05 DIAGNOSIS — D649 Anemia, unspecified: Secondary | ICD-10-CM | POA: Insufficient documentation

## 2023-08-05 DIAGNOSIS — R1030 Lower abdominal pain, unspecified: Secondary | ICD-10-CM

## 2023-08-05 DIAGNOSIS — R509 Fever, unspecified: Secondary | ICD-10-CM | POA: Diagnosis not present

## 2023-08-05 DIAGNOSIS — R1031 Right lower quadrant pain: Secondary | ICD-10-CM

## 2023-08-05 LAB — COMPREHENSIVE METABOLIC PANEL
ALT: 10 U/L (ref 0–44)
AST: 12 U/L — ABNORMAL LOW (ref 15–41)
Albumin: 4.1 g/dL (ref 3.5–5.0)
Alkaline Phosphatase: 46 U/L (ref 38–126)
Anion gap: 6 (ref 5–15)
BUN: 12 mg/dL (ref 6–20)
CO2: 26 mmol/L (ref 22–32)
Calcium: 9.3 mg/dL (ref 8.9–10.3)
Chloride: 104 mmol/L (ref 98–111)
Creatinine, Ser: 0.87 mg/dL (ref 0.44–1.00)
GFR, Estimated: >60 mL/min (ref >60)
Glucose, Bld: 86 mg/dL (ref 70–99)
Potassium: 3.6 mmol/L (ref 3.5–5.1)
Sodium: 136 mmol/L (ref 135–145)
Total Bilirubin: 0.4 mg/dL (ref 0.0–1.2)
Total Protein: 7 g/dL (ref 6.5–8.1)

## 2023-08-05 LAB — WET PREP, GENITAL
Sperm: NONE SEEN
Trich, Wet Prep: NONE SEEN
WBC, Wet Prep HPF POC: >=10 — AB (ref <10)
Yeast Wet Prep HPF POC: NONE SEEN

## 2023-08-05 LAB — URINALYSIS, ROUTINE W REFLEX MICROSCOPIC
Bacteria, UA: NONE SEEN
Glucose, UA: NEGATIVE mg/dL
Hgb urine dipstick: NEGATIVE
Ketones, ur: NEGATIVE mg/dL
Leukocytes,Ua: NEGATIVE
Nitrite: POSITIVE — AB
Protein, ur: NEGATIVE mg/dL
Specific Gravity, Urine: 1.009 (ref 1.005–1.030)
pH: 6.5 (ref 5.0–8.0)

## 2023-08-05 LAB — POCT URINALYSIS DIP (MANUAL ENTRY)
Bilirubin, UA: NEGATIVE
Blood, UA: NEGATIVE
Glucose, UA: NEGATIVE mg/dL
Ketones, POC UA: NEGATIVE mg/dL
Leukocytes, UA: NEGATIVE
Nitrite, UA: NEGATIVE
Spec Grav, UA: 1.02 (ref 1.010–1.025)
Urobilinogen, UA: 0.2 U/dL
pH, UA: 7 (ref 5.0–8.0)

## 2023-08-05 LAB — CBC WITH DIFFERENTIAL/PLATELET
Abs Immature Granulocytes: 0.01 10*3/uL (ref 0.00–0.07)
Basophils Absolute: 0 10*3/uL (ref 0.0–0.1)
Basophils Relative: 1 %
Eosinophils Absolute: 0.1 10*3/uL (ref 0.0–0.5)
Eosinophils Relative: 3 %
HCT: 35.6 % — ABNORMAL LOW (ref 36.0–46.0)
Hemoglobin: 11.3 g/dL — ABNORMAL LOW (ref 12.0–15.0)
Immature Granulocytes: 0 %
Lymphocytes Relative: 35 %
Lymphs Abs: 1.9 10*3/uL (ref 0.7–4.0)
MCH: 29.7 pg (ref 26.0–34.0)
MCHC: 31.7 g/dL (ref 30.0–36.0)
MCV: 93.4 fL (ref 80.0–100.0)
Monocytes Absolute: 0.5 10*3/uL (ref 0.1–1.0)
Monocytes Relative: 9 %
Neutro Abs: 2.9 10*3/uL (ref 1.7–7.7)
Neutrophils Relative %: 52 %
Platelets: 279 10*3/uL (ref 150–400)
RBC: 3.81 MIL/uL — ABNORMAL LOW (ref 3.87–5.11)
RDW: 12.2 % (ref 11.5–15.5)
WBC: 5.5 10*3/uL (ref 4.0–10.5)
nRBC: 0 % (ref 0.0–0.2)

## 2023-08-05 LAB — POCT URINE PREGNANCY: Preg Test, Ur: NEGATIVE

## 2023-08-05 LAB — LIPASE, BLOOD: Lipase: 21 U/L (ref 11–51)

## 2023-08-05 MED ORDER — IOHEXOL 300 MG/ML  SOLN
100.0000 mL | Freq: Once | INTRAMUSCULAR | Status: AC | PRN
  Administered 2023-08-05: 85 mL via INTRAVENOUS

## 2023-08-05 MED ORDER — MORPHINE SULFATE (PF) 4 MG/ML IV SOLN
4.0000 mg | Freq: Once | INTRAVENOUS | Status: AC
  Administered 2023-08-05: 4 mg via INTRAVENOUS
  Filled 2023-08-05: qty 1

## 2023-08-05 MED ORDER — LACTATED RINGERS IV BOLUS
1000.0000 mL | Freq: Once | INTRAVENOUS | Status: AC
  Administered 2023-08-05: 1000 mL via INTRAVENOUS

## 2023-08-05 MED ORDER — METRONIDAZOLE 500 MG PO TABS: 500.0000 mg | ORAL_TABLET | Freq: Two times a day (BID) | ORAL | 0 refills | Status: DC

## 2023-08-05 MED ORDER — ONDANSETRON HCL 4 MG/2ML IJ SOLN
4.0000 mg | Freq: Once | INTRAMUSCULAR | Status: AC
  Administered 2023-08-05: 4 mg via INTRAVENOUS
  Filled 2023-08-05: qty 2

## 2023-08-05 NOTE — ED Triage Notes (Signed)
Pt c/o abdominal cramping/pain, lower back pain, fever of 100.8 that began this morning.

## 2023-08-05 NOTE — ED Notes (Signed)
Patient is being discharged from the Urgent Care and sent to the Emergency Department via POV . Per Reita May, FNP, patient is in need of higher level of care due to Possible appendicitis. Patient is aware and verbalizes understanding of plan of care.  Vitals:   08/05/23 1727  BP: 133/89  Pulse: 82  Resp: 16  Temp: (!) 97.4 F (36.3 C)  SpO2: 99%

## 2023-08-05 NOTE — ED Provider Notes (Signed)
 [facility]    CSN: [id number] Arrival date & time: 08/05/23  1723      History   Chief Complaint Chief Complaint  Patient presents with   Fever   Abdominal Cramping    HPI Donya Hitch is a 23 y.o. female.   Addysin Porco is a 23 y.o. female presenting for chief complaint of Fever and Abdominal Cramping that started this morning upon waking. Abdominal pain is localized to the right lower quadrant and intermittently radiates through the abdomen into the right lower back. Reports fever at 100.8 this morning, chills, and nausea without vomiting. She additionally reports thin white vaginal discharge with slight odor started a few days ago. Denies diarrhea, urinary symptoms, viral URI symptoms, rash, concern for STD, dyspareunia, and chance of pregnancy. LMP 07/24/2023, history of tubal ligation. No history of appendicitis/cholecystitis. Nothing makes pain better or worse.    Fever Abdominal Cramping    Past Medical History:  Diagnosis Date   Anxiety    Genital HSV    GERD (gastroesophageal reflux disease)    History of postpartum hemorrhage, 2022   History of pre-eclampsia 2022   Sinus tachycardia    saw cardiology dr Denzil Magnuson 09-14-2020 epic    Patient Active Problem List   Diagnosis Date Noted   Unwanted fertility 05/05/2023   SVT (supraventricular tachycardia) (HCC) 10/06/2022   Anxiety    Chronic hypertension 08/27/2020   GERD (gastroesophageal reflux disease)     Past Surgical History:  Procedure Laterality Date   LAPAROSCOPIC BILATERAL SALPINGECTOMY Bilateral 05/05/2023   Procedure: LAPAROSCOPIC BILATERAL SALPINGECTOMY;  Surgeon: Milas Hock, MD;  Location: [facility];  Service: Gynecology;  Laterality: Bilateral;   NO PAST SURGERIES     WISDOM TOOTH EXTRACTION  2017   with sedation    OB History     Gravida  2   Para  2   Term  1   Preterm  1   AB  0   Living  3      SAB  0   IAB   0   Ectopic  0   Multiple  1   Live Births  3            Home Medications    Prior to Admission medications   Medication Sig Start Date End Date Taking? Authorizing Provider  azithromycin (ZITHROMAX) 250 MG tablet Day 1: take 2 tablets. Day 2-5: Take 1 tablet daily. Patient not taking: Reported on 08/05/2023 06/11/23   Wallis Bamberg, PA-C  calcium carbonate (TUMS - DOSED IN MG ELEMENTAL CALCIUM) 500 MG chewable tablet Chew 1 tablet by mouth as needed for indigestion or heartburn. Patient not taking: Reported on 05/28/2023    [provider]  cetirizine (ZYRTEC) 10 MG tablet Take 10 mg by mouth daily.    [provider]  cyclobenzaprine (FLEXERIL) 5 MG tablet Take 1-2 tablets (5-10 mg total) by mouth 3 (three) times daily as needed for muscle spasms. Patient not taking: Reported on 06/24/2023 05/06/23   Milas Hock, MD  famotidine (PEPCID) 20 MG tablet Take 20 mg by mouth as needed for heartburn or indigestion. Patient not taking: Reported on 08/05/2023    [provider]  ibuprofen (ADVIL) 800 MG tablet Take 1 tablet (800 mg total) by mouth 3 (three) times daily with meals as needed for headache, moderate pain (pain score 4-6) or cramping. 05/05/23   Milas Hock, MD  metroNIDAZOLE (FLAGYL) 500 MG tablet Take 1  tablet (500 mg total) by mouth 2 (two) times daily. 08/05/23   Karie Mainland, Amjad, PA-C  ondansetron (ZOFRAN-ODT) 4 MG disintegrating tablet Take 1 tablet (4 mg total) by mouth every 6 (six) hours as needed for nausea. Patient not taking: Reported on 06/24/2023 05/05/23   Milas Hock, MD  oxyCODONE (OXY IR/ROXICODONE) 5 MG immediate release tablet Take 1 tablet (5 mg total) by mouth every 4 (four) hours as needed for severe pain (pain score 7-10) or breakthrough pain. Patient not taking: Reported on 06/24/2023 05/05/23   Milas Hock, MD  predniSONE (DELTASONE) 20 MG tablet Take 2 tablets daily with breakfast. Patient not taking: Reported on 08/05/2023 06/02/23    Wallis Bamberg, PA-C  promethazine-dextromethorphan (PROMETHAZINE-DM) 6.25-15 MG/5ML syrup Take 5 mLs by mouth 3 (three) times daily as needed for cough. Patient not taking: Reported on 08/05/2023 06/02/23   Wallis Bamberg, PA-C  valACYclovir (VALTREX) 1000 MG tablet Take 1 tablet (1,000 mg total) by mouth 2 (two) times daily. Take for ten days. 05/29/23   Milas Hock, MD    Family History Family History  Problem Relation Age of Onset   Diabetes Mellitus II Mother    Hypertension Mother    Arthritis Father    Hypertension Father    Stomach cancer Paternal Grandmother    Stomach cancer Maternal Aunt    Breast cancer Neg Hx    Colon cancer Neg Hx    Cervical cancer Neg Hx    Heart disease Neg Hx     Social History Social History   Tobacco Use   Smoking status: Never   Smokeless tobacco: Never  Vaping Use   Vaping status: Never Used  Substance Use Topics   Alcohol use: Yes    Comment: occ   Drug use: Never     Allergies   Dust mite extract, Mixed ragweed, and Venofer [iron sucrose]   Review of Systems Review of Systems  Constitutional:  Positive for fever.  Per HPI   Physical Exam Triage Vital Signs ED Triage Vitals  Encounter Vitals Group     BP 08/05/23 1727 133/89     Systolic BP Percentile --      Diastolic BP Percentile --      Pulse Rate 08/05/23 1727 82     Resp 08/05/23 1727 16     Temp 08/05/23 1727 (!) 97.4 F (36.3 C)     Temp Source 08/05/23 1727 Oral     SpO2 08/05/23 1727 99 %     Weight --      Height --      Head Circumference --      Peak Flow --      Pain Score 08/05/23 1730 8     Pain Loc --      Pain Education --      Exclude from Growth Chart --    No data found.  Updated Vital Signs BP 133/89 (BP Location: Right Arm)   Pulse 82   Temp (!) 97.4 F (36.3 C) (Oral)   Resp 16   LMP 07/24/2023 (Exact Date)   SpO2 99%   Visual Acuity Right Eye Distance:   Left Eye Distance:   Bilateral Distance:    Right Eye Near:   Left  Eye Near:    Bilateral Near:     Physical Exam Vitals and nursing note reviewed.  Constitutional:      Appearance: She is not ill-appearing or toxic-appearing.  HENT:     Head: Normocephalic and atraumatic.  Right Ear: Hearing and external ear normal.     Left Ear: Hearing and external ear normal.     Nose: Nose normal.     Mouth/Throat:     Lips: Pink.  Eyes:     General: Lids are normal. Vision grossly intact. Gaze aligned appropriately.     Extraocular Movements: Extraocular movements intact.     Conjunctiva/sclera: Conjunctivae normal.  Cardiovascular:     Rate and Rhythm: Normal rate and regular rhythm.     Heart sounds: Normal heart sounds, S1 normal and S2 normal.  Pulmonary:     Effort: Pulmonary effort is normal. No respiratory distress.     Breath sounds: Normal breath sounds and air entry.  Abdominal:     General: Bowel sounds are normal.     Palpations: Abdomen is soft.     Tenderness: There is abdominal tenderness in the right lower quadrant. There is guarding. There is no right CVA tenderness or left CVA tenderness. Positive signs include McBurney's sign.  Musculoskeletal:     Cervical back: Neck supple.  Skin:    General: Skin is warm and dry.     Capillary Refill: Capillary refill takes less than 2 seconds.     Findings: No rash.  Neurological:     General: No focal deficit present.     Mental Status: She is alert and oriented to person, place, and time. Mental status is at baseline.     Cranial Nerves: No dysarthria or facial asymmetry.  Psychiatric:        Mood and Affect: Mood normal.        Speech: Speech normal.        Behavior: Behavior normal.        Thought Content: Thought content normal.        Judgment: Judgment normal.      UC Treatments / Results  Labs (all labs ordered are listed, but only abnormal results are displayed) Labs Reviewed  POCT URINALYSIS DIP (MANUAL ENTRY) - Abnormal; Notable for the following components:      Result  Value   Protein Ur, POC trace (*)    All other components within normal limits  POCT URINE PREGNANCY    EKG   Radiology No results found.  Procedures Procedures (including critical care time)  Medications Ordered in UC Medications - No data to display  Initial Impression / Assessment and Plan / UC Course  I have reviewed the triage vital signs and the nursing notes.  Pertinent labs & imaging results that were available during my care of the patient were reviewed by me and considered in my medical decision making (see chart for details).   1. Right lower quadrant abdominal pain, nausea without vomiting, fever Symptoms and presentation suspicious for acute intraabdominal pathology. Differential includes appendicitis, PID, ruptured ovarian cyst, UTI, constipation, ureteral stone, and vaginal infection.  Recommend further workup in the ER due to fever and abdominal pain with exam findings concerning for intraabdominal emergent pathology. Discussed clinical concerns/exam findings leading to recommendation for further workup in the ER setting and risks of deferring ER visit with patient/family. Patient/family express understanding and agreement with plan, discharged to ER via personal vehicle.    Final Clinical Impressions(s) / UC Diagnoses   Final diagnoses:  Right lower quadrant abdominal pain  Nausea without vomiting  Fever, unspecified   Discharge Instructions   None    ED Prescriptions   None    PDMP not reviewed this encounter.   Madeleine Fenn,  Donavan Burnet, FNP 08/12/23 2114

## 2023-08-05 NOTE — ED Notes (Signed)
 ED Provider at bedside.

## 2023-08-05 NOTE — ED Provider Notes (Signed)
[place] EMERGENCY DEPARTMENT AT [facility] Provider Note   CSN: [id number] Arrival date & time: 08/05/23  1827     History  Chief Complaint  Patient presents with   Abdominal Pain    Alexis [name] is a 23 y.o. female.  23 year old female presents today for concern of lower abdominal pain that started this morning.  Pain also radiated to the left lower back.  She was seen at urgent care and referred to the emergency department due to concern for appendicitis or other acute intra-abdominal process.  She also states for the past 4 to 5 days she has noticed some whitish vaginal discharge.  Denies any vaginal bleeding, dysuria, other GU complaints.  Endorses nausea but no vomiting.  The history is provided by the patient. No language interpreter was used.       Home Medications Prior to Admission medications   Medication Sig Start Date End Date Taking? Authorizing Provider  azithromycin (ZITHROMAX) 250 MG tablet Day 1: take 2 tablets. Day 2-5: Take 1 tablet daily. Patient not taking: Reported on 08/05/2023 06/11/23   Wallis Bamberg, PA-C  calcium carbonate (TUMS - DOSED IN MG ELEMENTAL CALCIUM) 500 MG chewable tablet Chew 1 tablet by mouth as needed for indigestion or heartburn. Patient not taking: Reported on 05/28/2023    [provider]  cetirizine (ZYRTEC) 10 MG tablet Take 10 mg by mouth daily.    [provider]  cyclobenzaprine (FLEXERIL) 5 MG tablet Take 1-2 tablets (5-10 mg total) by mouth 3 (three) times daily as needed for muscle spasms. Patient not taking: Reported on 06/24/2023 05/06/23   Milas Hock, MD  famotidine (PEPCID) 20 MG tablet Take 20 mg by mouth as needed for heartburn or indigestion. Patient not taking: Reported on 08/05/2023    [provider]  ibuprofen (ADVIL) 800 MG tablet Take 1 tablet (800 mg total) by mouth 3 (three) times daily with meals as needed for headache, moderate pain (pain score 4-6) or cramping.  05/05/23   Milas Hock, MD  ondansetron (ZOFRAN-ODT) 4 MG disintegrating tablet Take 1 tablet (4 mg total) by mouth every 6 (six) hours as needed for nausea. Patient not taking: Reported on 06/24/2023 05/05/23   Milas Hock, MD  oxyCODONE (OXY IR/ROXICODONE) 5 MG immediate release tablet Take 1 tablet (5 mg total) by mouth every 4 (four) hours as needed for severe pain (pain score 7-10) or breakthrough pain. Patient not taking: Reported on 06/24/2023 05/05/23   Milas Hock, MD  predniSONE (DELTASONE) 20 MG tablet Take 2 tablets daily with breakfast. Patient not taking: Reported on 08/05/2023 06/02/23   Wallis Bamberg, PA-C  promethazine-dextromethorphan (PROMETHAZINE-DM) 6.25-15 MG/5ML syrup Take 5 mLs by mouth 3 (three) times daily as needed for cough. Patient not taking: Reported on 08/05/2023 06/02/23   Wallis Bamberg, PA-C  valACYclovir (VALTREX) 1000 MG tablet Take 1 tablet (1,000 mg total) by mouth 2 (two) times daily. Take for ten days. 05/29/23   Milas Hock, MD      Allergies    Dust mite extract, Mixed ragweed, and Venofer [iron sucrose]    Review of Systems   Review of Systems  Constitutional:  Negative for chills and fever.  Respiratory:  Negative for shortness of breath.   Cardiovascular:  Negative for chest pain.  Gastrointestinal:  Positive for abdominal pain and nausea. Negative for vomiting.  Genitourinary:  Positive for pelvic pain and vaginal discharge. Negative for dysuria, flank pain and vaginal bleeding.  All other systems reviewed and  are negative.   Physical Exam Updated Vital Signs BP (!) 145/103 (BP Location: Right Arm)   Pulse 79   Temp 98.4 F (36.9 C) (Oral)   Resp 16   Ht 5\' 5"  (1.651 m)   Wt 84.4 kg   LMP 07/24/2023 (Exact Date)   SpO2 100%   BMI 30.95 kg/m  Physical Exam Vitals and nursing note reviewed.  Constitutional:      General: She is not in acute distress.    Appearance: Normal appearance. She is not ill-appearing.  HENT:     Head:  Normocephalic and atraumatic.     Nose: Nose normal.  Eyes:     General: No scleral icterus.    Extraocular Movements: Extraocular movements intact.     Conjunctiva/sclera: Conjunctivae normal.  Cardiovascular:     Rate and Rhythm: Normal rate and regular rhythm.  Pulmonary:     Effort: Pulmonary effort is normal. No respiratory distress.     Breath sounds: Normal breath sounds. No wheezing or rales.  Abdominal:     General: There is no distension.     Tenderness: There is no abdominal tenderness. There is no right CVA tenderness, left CVA tenderness or guarding.  Genitourinary:    Comments: Defers GU exam Musculoskeletal:        General: Normal range of motion.     Cervical back: Normal range of motion.  Skin:    General: Skin is warm and dry.  Neurological:     General: No focal deficit present.     Mental Status: She is alert. Mental status is at baseline.     ED Results / Procedures / Treatments   Labs (all labs ordered are listed, but only abnormal results are displayed) Labs Reviewed - No data to display  EKG None  Radiology No results found.  Procedures Procedures    Medications Ordered in ED Medications - No data to display  ED Course/ Medical Decision Making/ A&P                                 Medical Decision Making Amount and/or Complexity of Data Reviewed Labs: ordered. Radiology: ordered.  Risk Prescription drug management.   Medical Decision Making / ED Course   This patient presents to the ED for concern of abdominal pain, this involves an extensive number of treatment options, and is a complaint that carries with it a high risk of complications and morbidity.  The differential diagnosis includes appendicitis, pyelonephritis, UTI, nephrolithiasis, colitis, pancreatitis, diverticulitis, cholecystitis  MDM: 23 year old female presents today for above-mentioned complaints.  Overall she is well-appearing without acute distress.   Hemodynamically stable.  Abdomen is nontender and without distention.  No guarding.  Admission considered but will reevaluate after labs and imaging.  CBC without leukocytosis.  Hemoglobin of 11.3 which is around her baseline.  UA is without evidence of UTI.  It is nitrite positive but no WBCs or leukocytes.  Will evaluate with CT, and ultrasound if ultrasound is here tonight.  Ultrasound available until midnight.  Will proceed with this.  Will obtain CT. Because of her vaginal discharge will obtain STI testing.  She defers GU exam.  She will self swab.  Lipase within normal.  CT abdomen pelvis without acute concerns.  Pelvic ultrasound without acute concerning finding.  On my attendings independent interpretation did seem like patient has significant stool burden.  Discussion had regarding aggressive bowel regimen with  patient.  Clue cells present on wet prep.  Flagyl prescribed.  Patient discharged in stable condition.  Return precaution discussed.  Patient voices understanding and is in agreement with plan.         Lab Tests: -I ordered, reviewed, and interpreted labs.   The pertinent results include:   Labs Reviewed  CBC WITH DIFFERENTIAL/PLATELET - Abnormal; Notable for the following components:      Result Value   RBC 3.81 (*)    Hemoglobin 11.3 (*)    HCT 35.6 (*)    All other components within normal limits  URINALYSIS, ROUTINE W REFLEX MICROSCOPIC - Abnormal; Notable for the following components:   Bilirubin Urine SMALL (*)    Nitrite POSITIVE (*)    All other components within normal limits  COMPREHENSIVE METABOLIC PANEL  LIPASE, BLOOD      EKG  EKG Interpretation Date/Time:    Ventricular Rate:    PR Interval:    QRS Duration:    QT Interval:    QTC Calculation:   R Axis:      Text Interpretation:           Imaging Studies ordered: I ordered imaging studies including CT abdomen pelvis with contrast, pelvic ultrasound I independently visualized  and interpreted imaging. I agree with the radiologist interpretation   Medicines ordered and prescription drug management: Meds ordered this encounter  Medications   morphine (PF) 4 MG/ML injection 4 mg   lactated ringers bolus 1,000 mL   ondansetron (ZOFRAN) injection 4 mg    -I have reviewed the patients home medicines and have made adjustments as needed  Reevaluation: After the interventions noted above, I reevaluated the patient and found that they have :improved  Co morbidities that complicate the patient evaluation  Past Medical History:  Diagnosis Date   Anxiety    Genital HSV    GERD (gastroesophageal reflux disease)    History of postpartum hemorrhage, 2022   History of pre-eclampsia 2022   Sinus tachycardia    saw cardiology dr Denzil Magnuson 09-14-2020 epic      Dispostion: Discharged in stable condition.  Return precaution discussed.  Patient voices understanding and is in agreement with plan.   Final Clinical Impression(s) / ED Diagnoses Final diagnoses:  Lower abdominal pain  Bacterial vaginosis    Rx / DC Orders ED Discharge Orders          Ordered    metroNIDAZOLE (FLAGYL) 500 MG tablet  2 times daily        08/05/23 2136              Marita Kansas, PA-C 08/05/23 2136    Melene Plan, DO 08/05/23 2222

## 2023-08-05 NOTE — Discharge Instructions (Signed)
Your workup today was reassuring.  Pelvic ultrasound, CT scan did not show any concerning findings.  He did have increase stool burden.  Recommend taking 3 capfuls of MiraLAX until you have the desired effect on your stools.  Follow-up with your primary care provider.  Return for any concerning symptoms.  The gonorrhea and Chlamydia testing will take about 2 days to come back.  If these are positive you will receive a call.  Otherwise you can follow-up on the results on MyChart.

## 2023-08-05 NOTE — ED Triage Notes (Signed)
Woke up this am with lower abdominal and pelvic cramping  Lower back pain.  Vaginal discharge whitish.  Came from urgent care for eval

## 2023-08-07 LAB — GC/CHLAMYDIA PROBE AMP (~~LOC~~) NOT AT ARMC
Chlamydia: NEGATIVE
Comment: NEGATIVE
Comment: NORMAL
Neisseria Gonorrhea: NEGATIVE

## 2023-08-17 ENCOUNTER — Ambulatory Visit: Payer: Medicaid Other | Admitting: Obstetrics and Gynecology

## 2023-08-24 ENCOUNTER — Ambulatory Visit (HOSPITAL_BASED_OUTPATIENT_CLINIC_OR_DEPARTMENT_OTHER)
Admission: RE | Admit: 2023-08-24 | Discharge: 2023-08-24 | Disposition: A | Discharge status: Home / Self Care | Source: Ambulatory Visit | Attending: Urgent Care | Admitting: Urgent Care

## 2023-08-24 ENCOUNTER — Encounter: Payer: Self-pay | Admitting: Urgent Care

## 2023-08-24 ENCOUNTER — Ambulatory Visit
Admission: EM | Admit: 2023-08-24 | Discharge: 2023-08-24 | Disposition: A | Discharge status: Home / Self Care | Attending: Family Medicine | Admitting: Family Medicine

## 2023-08-24 DIAGNOSIS — J209 Acute bronchitis, unspecified: Secondary | ICD-10-CM | POA: Insufficient documentation

## 2023-08-24 LAB — POC COVID19/FLU A&B COMBO
Covid Antigen, POC: NEGATIVE
Influenza A Antigen, POC: NEGATIVE
Influenza B Antigen, POC: NEGATIVE

## 2023-08-24 MED ORDER — ALBUTEROL SULFATE HFA 108 (90 BASE) MCG/ACT IN AERS: 1.0000 | INHALATION_SPRAY | Freq: Four times a day (QID) | RESPIRATORY_TRACT | 0 refills | Status: DC | PRN

## 2023-08-24 MED ORDER — PREDNISONE 50 MG PO TABS: 50.0000 mg | ORAL_TABLET | Freq: Every day | ORAL | 0 refills | Status: DC

## 2023-08-24 NOTE — ED Provider Notes (Signed)
 [facility] - URGENT CARE CENTER  Note:  This document was prepared using Conservation officer, historic buildings and may include unintentional dictation errors.  MRN: [id number] DOB: [birth year]  Subjective:   Alexis [name] is a 23 y.o. female presenting for 2-day history of acute onset coughing, shortness of breath, chest congestion, runny and stuffy nose.  Has had significant bouts of bronchitis.  Last episode was in December.  No smoking of any kind including cigarettes, cigars, vaping, marijuana use.  Has not had COVID or flu this season.  Would like to be checked.  Her 2 children did test positive for this but was 2 months ago.  No current facility-administered medications for this encounter.  Current Outpatient Medications:    cetirizine (ZYRTEC) 10 MG tablet, Take 10 mg by mouth daily., Disp: , Rfl:    azithromycin (ZITHROMAX) 250 MG tablet, Day 1: take 2 tablets. Day 2-5: Take 1 tablet daily. (Patient not taking: Reported on 08/05/2023), Disp: 6 tablet, Rfl: 0   calcium carbonate (TUMS - DOSED IN MG ELEMENTAL CALCIUM) 500 MG chewable tablet, Chew 1 tablet by mouth as needed for indigestion or heartburn. (Patient not taking: Reported on 05/28/2023), Disp: , Rfl:    cetirizine (ZYRTEC) 10 MG tablet, Take 10 mg by mouth daily., Disp: , Rfl:    cyclobenzaprine (FLEXERIL) 5 MG tablet, Take 1-2 tablets (5-10 mg total) by mouth 3 (three) times daily as needed for muscle spasms. (Patient not taking: Reported on 05/28/2023), Disp: 30 tablet, Rfl: 0   famotidine (PEPCID) 20 MG tablet, Take 20 mg by mouth as needed for heartburn or indigestion. (Patient not taking: Reported on 08/05/2023), Disp: , Rfl:    ibuprofen (ADVIL) 800 MG tablet, Take 1 tablet (800 mg total) by mouth 3 (three) times daily with meals as needed for headache, moderate pain (pain score 4-6) or cramping., Disp: 60 tablet, Rfl: 0   metroNIDAZOLE (FLAGYL) 500 MG tablet, Take 1 tablet (500 mg total) by mouth 2 (two) times  daily., Disp: 14 tablet, Rfl: 0   ondansetron (ZOFRAN-ODT) 4 MG disintegrating tablet, Take 1 tablet (4 mg total) by mouth every 6 (six) hours as needed for nausea. (Patient not taking: Reported on 05/28/2023), Disp: 20 tablet, Rfl: 0   oxyCODONE (OXY IR/ROXICODONE) 5 MG immediate release tablet, Take 1 tablet (5 mg total) by mouth every 4 (four) hours as needed for severe pain (pain score 7-10) or breakthrough pain. (Patient not taking: Reported on 05/28/2023), Disp: 10 tablet, Rfl: 0   predniSONE (DELTASONE) 20 MG tablet, Take 2 tablets daily with breakfast. (Patient not taking: Reported on 08/05/2023), Disp: 10 tablet, Rfl: 0   promethazine-dextromethorphan (PROMETHAZINE-DM) 6.25-15 MG/5ML syrup, Take 5 mLs by mouth 3 (three) times daily as needed for cough. (Patient not taking: Reported on 08/05/2023), Disp: 200 mL, Rfl: 0   valACYclovir (VALTREX) 1000 MG tablet, Take 1 tablet (1,000 mg total) by mouth 2 (two) times daily. Take for ten days., Disp: 20 tablet, Rfl: 0   Allergies  Allergen Reactions   Dust Mite Extract Hives   Mixed Ragweed Hives   Venofer [Iron Sucrose] Other (See Comments)    Chest pain    Past Medical History:  Diagnosis Date   Anxiety    Genital HSV    GERD (gastroesophageal reflux disease)    History of postpartum hemorrhage, 2022   History of pre-eclampsia 2022   Sinus tachycardia    saw cardiology dr Denzil Magnuson 09-14-2020 epic     Past Surgical History:  Procedure Laterality Date   LAPAROSCOPIC BILATERAL SALPINGECTOMY Bilateral 05/05/2023   Procedure: LAPAROSCOPIC BILATERAL SALPINGECTOMY;  Surgeon: Milas Hock, MD;  Location: [facility];  Service: Gynecology;  Laterality: Bilateral;   NO PAST SURGERIES     WISDOM TOOTH EXTRACTION  2017   with sedation    Family History  Problem Relation Age of Onset   Diabetes Mellitus II Mother    Hypertension Mother    Arthritis Father    Hypertension Father    Stomach cancer Paternal  Grandmother    Stomach cancer Maternal Aunt    Breast cancer Neg Hx    Colon cancer Neg Hx    Cervical cancer Neg Hx    Heart disease Neg Hx     Social History   Tobacco Use   Smoking status: Never   Smokeless tobacco: Never  Vaping Use   Vaping status: Some Days  Substance Use Topics   Alcohol use: Yes    Comment: occ   Drug use: Never    ROS   Objective:   Vitals: BP (!) 135/93 (BP Location: Left Arm)   Pulse 85   Temp 99 F (37.2 C) (Oral)   Resp 18   LMP 08/18/2023 (Exact Date)   SpO2 98%   Breastfeeding No   Physical Exam Constitutional:      General: She is not in acute distress.    Appearance: Normal appearance. She is well-developed and normal weight. She is not ill-appearing, toxic-appearing or diaphoretic.  HENT:     Head: Normocephalic and atraumatic.     Right Ear: Tympanic membrane, ear canal and external ear normal. No drainage or tenderness. No middle ear effusion. There is no impacted cerumen. Tympanic membrane is not erythematous or bulging.     Left Ear: Tympanic membrane, ear canal and external ear normal. No drainage or tenderness.  No middle ear effusion. There is no impacted cerumen. Tympanic membrane is not erythematous or bulging.     Nose: Congestion present. No rhinorrhea.     Mouth/Throat:     Mouth: Mucous membranes are moist. No oral lesions.     Pharynx: No pharyngeal swelling, oropharyngeal exudate, posterior oropharyngeal erythema or uvula swelling.     Tonsils: No tonsillar exudate or tonsillar abscesses. 0 on the right. 0 on the left.     Comments: Postnasal drainage overlying pharynx. Eyes:     General: No scleral icterus.       Right eye: No discharge.        Left eye: No discharge.     Extraocular Movements: Extraocular movements intact.     Right eye: Normal extraocular motion.     Left eye: Normal extraocular motion.     Conjunctiva/sclera: Conjunctivae normal.  Cardiovascular:     Rate and Rhythm: Normal rate and  regular rhythm.     Heart sounds: Normal heart sounds. No murmur heard.    No friction rub. No gallop.  Pulmonary:     Effort: Pulmonary effort is normal. No respiratory distress.     Breath sounds: No stridor. Wheezing and rhonchi present. No rales.     Comments: Trace rhonchi and wheezing over mid to lower lung fields bilaterally. Chest:     Chest wall: No tenderness.  Musculoskeletal:     Cervical back: Normal range of motion and neck supple.  Lymphadenopathy:     Cervical: No cervical adenopathy.  Skin:    General: Skin is warm and dry.  Neurological:  General: No focal deficit present.     Mental Status: She is alert and oriented to person, place, and time.  Psychiatric:        Mood and Affect: Mood normal.        Behavior: Behavior normal.     Results for orders placed or performed during the hospital encounter of 08/24/23 (from the past 24 hours)  POC Covid19/Flu A&B Antigen     Status: Normal   Collection Time: 08/24/23 10:28 AM  Result Value Ref Range   Influenza A Antigen, POC Negative    Influenza B Antigen, POC Negative    Covid Antigen, POC Negative      Assessment and Plan :   PDMP not reviewed this encounter.  1. Acute bronchitis, unspecified organism    Will pursue outpatient imaging, x-ray order placed.  Recommend prednisone, albuterol for recurrent bronchitis.  Likely has underlying reactive airway disease.  Counseled patient on potential for adverse effects with medications prescribed/recommended today, ER and return-to-clinic precautions discussed, patient verbalized understanding.    Wallis Bamberg, PA-C 08/24/23 1128

## 2023-08-24 NOTE — Discharge Instructions (Addendum)
 We will manage this as a recurrent bronchitis infection. For sore throat or cough try using a honey-based tea. Use 3 teaspoons of honey with juice squeezed from half lemon. Place shaved pieces of ginger into 1/2-1 cup of water and warm over stove top. Then mix the ingredients and repeat every 4 hours as needed. Please take Tylenol 500mg -650mg  once every 6 hours for fevers, aches and pains. Hydrate very well with at least 2 liters (64 ounces) of water. Eat light meals such as soups (chicken and noodles, chicken wild rice, vegetable).  Do not eat any foods that you are allergic to.  Start an antihistamine like Zyrtec (10mg  daily) for postnasal drainage, sinus congestion.  You can take this together with prednisone and albuterol. Use cough syrup as needed.   I have placed orders to have an x-ray done at the med center in [place] [facility].  Please had there now.  Go through the main hospital and not the emergency room.  Once you are there and let them know that you will came to our clinic and we send she to their facility for an outpatient x-ray.  If no one is at the front desk then they are likely out the rest of the day and at that point you would have to go through the emergency room.  Do not check in as a patient through the emergency room.  Simply let them know that you are there for an outpatient x-ray from our clinic.  I will call you with your results and update our treatment plan if necessary after I get the report.

## 2023-08-24 NOTE — ED Triage Notes (Signed)
 Pt reports cough, shortness of breath,chest congestion and runny nose  x 2 days. Ibuprofen and Zyrtec give some relief.

## 2023-09-01 ENCOUNTER — Ambulatory Visit: Admitting: Obstetrics and Gynecology

## 2023-09-01 ENCOUNTER — Other Ambulatory Visit (HOSPITAL_COMMUNITY)
Admission: RE | Admit: 2023-09-01 | Discharge: 2023-09-01 | Disposition: A | Discharge status: Home / Self Care | Source: Ambulatory Visit | Attending: Obstetrics and Gynecology | Admitting: Obstetrics and Gynecology

## 2023-09-01 VITALS — BP 126/85 | HR 85 | Wt 183.0 lb

## 2023-09-01 DIAGNOSIS — Z113 Encounter for screening for infections with a predominantly sexual mode of transmission: Secondary | ICD-10-CM | POA: Diagnosis present

## 2023-09-01 DIAGNOSIS — B3731 Acute candidiasis of vulva and vagina: Secondary | ICD-10-CM | POA: Diagnosis not present

## 2023-09-01 DIAGNOSIS — N898 Other specified noninflammatory disorders of vagina: Secondary | ICD-10-CM

## 2023-09-01 MED ORDER — NYSTATIN 100000 UNIT/GM EX CREA
TOPICAL_CREAM | CUTANEOUS | 1 refills | Status: DC
Start: 2023-09-01 — End: 2023-11-02

## 2023-09-01 NOTE — Progress Notes (Signed)
   GYNECOLOGY PROGRESS NOTE  History:  23 y.o. G2P1103 presents to [facility] for vaginal burning with urination. Reports irritation and feels swollen. Suprapubic  pain. Pain came on Sunday. Been getting worse. Has not tried any OTC treatment. Uses dove sensitive, and has not changed. Has not been sexually active  Has hx of HSV, does not feel she currently has an outbreak.   The following portions of the patient's history were reviewed and updated as appropriate: allergies, current medications, past family history, past medical history, past social history, past surgical history and problem list. Last pap smear on 02/05/23 was normal  Health Maintenance Due  Topic Date Due   HPV VACCINES (1 - 3-dose series) Never done   COVID-19 Vaccine (4 - 2024-25 season) 02/15/2023     Review of Systems:  Pertinent items are noted in HPI.   Objective:  Physical Exam Blood pressure 126/85, pulse 85, last menstrual period 08/18/2023, not currently breastfeeding. VS reviewed, nursing note reviewed,  Constitutional: well developed, well nourished, no distress HEENT: normocephalic CV: normal rate Pulm/chest wall: normal effort Breast Exam: deferred Abdomen: soft Neuro: alert and oriented x 3 Skin: warm, dry Psych: affect normal Pelvic exam: Cervix pink, visually closed, without lesion, scant white creamy discharge external genitalia mild erythema and excoriation  Assessment & Plan:  1. Vaginal irritation (Primary)  - Cervicovaginal ancillary only( [place]) - Urine Culture  2. Screen for STD (sexually transmitted disease)  - Cervicovaginal ancillary only( [facility]) - HIV antibody (with reflex) - Hepatitis C Antibody - Hepatitis B Surface AntiGEN - RPR  3. Vulvar candidiasis Will follow up with swab - nystatin cream (MYCOSTATIN); Apply thin layer to the area twice a day  Dispense: 30 g; Refill: 1    Albertine Grates, FNP 1:30 PM

## 2023-09-02 ENCOUNTER — Telehealth: Payer: Self-pay

## 2023-09-02 DIAGNOSIS — B379 Candidiasis, unspecified: Secondary | ICD-10-CM

## 2023-09-02 LAB — HEPATITIS C ANTIBODY: Hep C Virus Ab: NONREACTIVE

## 2023-09-02 LAB — HIV ANTIBODY (ROUTINE TESTING W REFLEX): HIV Screen 4th Generation wRfx: NONREACTIVE

## 2023-09-02 LAB — CERVICOVAGINAL ANCILLARY ONLY
Bacterial Vaginitis (gardnerella): NEGATIVE
Candida Glabrata: NEGATIVE
Candida Vaginitis: POSITIVE — AB
Chlamydia: NEGATIVE
Comment: NEGATIVE
Comment: NEGATIVE
Comment: NEGATIVE
Comment: NEGATIVE
Comment: NEGATIVE
Comment: NORMAL
Neisseria Gonorrhea: NEGATIVE
Trichomonas: NEGATIVE

## 2023-09-02 LAB — RPR: RPR Ser Ql: NONREACTIVE

## 2023-09-02 LAB — HEPATITIS B SURFACE ANTIGEN: Hepatitis B Surface Ag: NEGATIVE

## 2023-09-02 MED ORDER — FLUCONAZOLE 150 MG PO TABS: ORAL_TABLET | ORAL | 0 refills | Status: DC

## 2023-09-02 NOTE — Telephone Encounter (Signed)
 Patient called requesting results. Patient made aware that she tested positive for yeast. Diflucan 150 mg x 1 tablet PO was sent to her pharmacy. Understanding was voiced. Dafna Romo l Deiondra Denley, CMA

## 2023-09-03 LAB — URINE CULTURE: Organism ID, Bacteria: NO GROWTH

## 2023-09-04 ENCOUNTER — Other Ambulatory Visit: Payer: Self-pay

## 2023-09-15 ENCOUNTER — Ambulatory Visit: Admitting: Advanced Practice Midwife

## 2023-09-15 VITALS — BP 121/86 | HR 89 | Wt 188.0 lb

## 2023-09-15 DIAGNOSIS — N9089 Other specified noninflammatory disorders of vulva and perineum: Secondary | ICD-10-CM

## 2023-09-15 DIAGNOSIS — L739 Follicular disorder, unspecified: Secondary | ICD-10-CM

## 2023-09-15 DIAGNOSIS — A6000 Herpesviral infection of urogenital system, unspecified: Secondary | ICD-10-CM | POA: Diagnosis not present

## 2023-09-15 MED ORDER — VALACYCLOVIR HCL 1 G PO TABS: 1000.0000 mg | ORAL_TABLET | Freq: Two times a day (BID) | ORAL | 12 refills | Status: AC

## 2023-09-15 NOTE — Progress Notes (Unsigned)
 GYNECOLOGY OFFICE VISIT NOTE  History:   Alexis [name] is a 23 y.o. [phone] here today for concern for HSV outbreak on labia minora and an additional lesion on mons. Labial lesions were painful and typical or previous outbreaks. Lesion on mons was tender (resolved) and drained small amount of pus. Lesions have mostly resolved w/ Valtrex. Gets outbreaks every few months. Only takes Valtrex for outbreaks. She denies any abnormal vaginal discharge, bleeding, pelvic pain or other concerns.       Past Medical History:  Diagnosis Date   Anxiety    Genital HSV    GERD (gastroesophageal reflux disease)    History of postpartum hemorrhage, 2022   History of pre-eclampsia 2022   Sinus tachycardia    saw cardiology dr Denzil Magnuson 09-14-2020 epic    Past Surgical History:  Procedure Laterality Date   LAPAROSCOPIC BILATERAL SALPINGECTOMY Bilateral 05/05/2023   Procedure: LAPAROSCOPIC BILATERAL SALPINGECTOMY;  Surgeon: Milas Hock, MD;  Location: [facility];  Service: Gynecology;  Laterality: Bilateral;   NO PAST SURGERIES     WISDOM TOOTH EXTRACTION  2017   with sedation    The following portions of the patient's history were reviewed and updated as appropriate: allergies, current medications, past family history, past medical history, past social history, past surgical history and problem list.     Review of Systems:  Pertinent items noted in HPI and remainder of comprehensive ROS otherwise negative.  Physical Exam:  BP 121/86 (BP Location: Left Arm, Patient Position: Sitting, Cuff Size: Large)   Pulse 89   Wt 188 lb (85.3 kg)   LMP 08/18/2023 (Exact Date)   BMI 31.28 kg/m  CONSTITUTIONAL: Well-developed, well-nourished female in no acute distress.  HEENT:  Normocephalic, atraumatic. External right and left ear normal. No scleral icterus.  SKIN: No rash noted. Not diaphoretic. No erythema. No pallor. MUSCULOSKELETAL: Normal range of motion. No  edema noted. NEUROLOGIC: Alert and oriented to person, place, and time. Normal muscle tone coordination. No cranial nerve deficit noted on observation. PSYCHIATRIC: Normal mood and affect. Normal behavior. Normal judgment and thought content. CARDIOVASCULAR: Normal heart rate noted RESPIRATORY: Effort normal, no problems with respiration noted ABDOMEN: Deferred   PELVIC:  NEFG except for partially healed 4 mm raised, dark lesion on mons. No warmth, tenderness, fluctuance, erythema or drainage. No visible lesions and no tenderness on labial.   Labs and Imaging NA     Assessment and Plan:    1. 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 May also reduce viral shedding.  - valACYclovir (VALTREX) 1000 MG tablet; Take 1 tablet (1,000 mg total) by mouth 2 (two) times daily. Take 1 tablet daily for suppression. Take 1 tablet twice a day for ten days for outbreaks.  Dispense: 60 tablet; Refill: 12  2. Folliculitis - Healing well. Discouraged from shaving with razor. Recommend frequent warm soaks of this recurs.   3. Labial lesion  - valACYclovir (VALTREX) 1000 MG tablet; Take 1 tablet (1,000 mg total) by mouth 2 (two) times daily. Take 1 tablet daily for suppression. Take 1 tablet twice a day for ten days for outbreaks.  Dispense: 60 tablet; Refill: 12   Routine preventative health maintenance measures emphasized. Please refer to After Visit Summary for other counseling recommendations.  F/U PRN  Recommend yealy annual Gyn appt Pap due 2027   [place], [place] 09/15/2023 5:14 PM Center for [employer], [facility]

## 2023-09-16 ENCOUNTER — Ambulatory Visit: Admitting: Obstetrics & Gynecology

## 2023-09-25 ENCOUNTER — Encounter: Payer: Self-pay | Admitting: Advanced Practice Midwife

## 2023-09-28 ENCOUNTER — Other Ambulatory Visit: Payer: Self-pay

## 2023-09-28 DIAGNOSIS — B379 Candidiasis, unspecified: Secondary | ICD-10-CM

## 2023-09-28 MED ORDER — FLUCONAZOLE 150 MG PO TABS: ORAL_TABLET | ORAL | 2 refills | Status: DC

## 2023-09-28 NOTE — Progress Notes (Signed)
 Patient sent a Mychart requesting medication for yeast. Patient states she was prescribed Amoxicillin by her Dentist. She states she gets UTIs after taking antibiotics. Diflucan 150 mg was sent to the patient's pharmacy. Alexis [name] l Jamaiya Tunnell, CMA

## 2023-10-12 ENCOUNTER — Ambulatory Visit
Admission: EM | Admit: 2023-10-12 | Discharge: 2023-10-12 | Disposition: A | Discharge status: Home / Self Care | Attending: Physician Assistant | Admitting: Physician Assistant

## 2023-10-12 DIAGNOSIS — J029 Acute pharyngitis, unspecified: Secondary | ICD-10-CM | POA: Diagnosis not present

## 2023-10-12 LAB — POCT RAPID STREP A (OFFICE): Rapid Strep A Screen: NEGATIVE

## 2023-10-12 NOTE — Discharge Instructions (Signed)
 Likely viral sore throat. Take ibuprofen  or tylenol  as needed Gargle warm salt water Return as needed, or if symptoms fail to improve

## 2023-10-12 NOTE — ED Triage Notes (Signed)
 Pt c/o sore throat,chills, cough x 2days.Took ibuprofen  and dayquil.

## 2023-10-12 NOTE — ED Provider Notes (Signed)
 [facility]    CSN: [id number] Arrival date & time: 10/12/23  5284      History   Chief Complaint Chief Complaint  Patient presents with   Sore Throat    HPI Nolie Kimak is a 23 y.o. female.   Patient here for sore throat x 4 days.  L sided.  She had tooth #18 pulled 2 weeks ago and she has concerns it could be related to the tooth. Admits nasal congestion, rhinorrhea in the AM, though she reports that's pretty normal for her.  Cough x today.  Denies fever, dental pain, gingival swelling, n/v/d, wheezing, SOB.  No sick contacts.      Past Medical History:  Diagnosis Date   Anxiety    Genital HSV    GERD (gastroesophageal reflux disease)    History of postpartum hemorrhage, 2022   History of pre-eclampsia 2022   Sinus tachycardia    saw cardiology dr Arlis Lakes 09-14-2020 epic    Patient Active Problem List   Diagnosis Date Noted   SVT (supraventricular tachycardia) (HCC) 10/06/2022   Anxiety    Chronic hypertension 08/27/2020   GERD (gastroesophageal reflux disease)     Past Surgical History:  Procedure Laterality Date   LAPAROSCOPIC BILATERAL SALPINGECTOMY Bilateral 05/05/2023   Procedure: LAPAROSCOPIC BILATERAL SALPINGECTOMY;  Surgeon: Lacey Pian, MD;  Location: [facility] ;  Service: Gynecology;  Laterality: Bilateral;   NO PAST SURGERIES     WISDOM TOOTH EXTRACTION  2017   with sedation    OB History     Gravida  2   Para  2   Term  1   Preterm  1   AB  0   Living  3      SAB  0   IAB  0   Ectopic  0   Multiple  1   Live Births  3            Home Medications    Prior to Admission medications   Medication Sig Start Date End Date Taking? Authorizing Provider  albuterol  (VENTOLIN  HFA) 108 (90 Base) MCG/ACT inhaler Inhale 1-2 puffs into the lungs every 6 (six) hours as needed for wheezing or shortness of breath. Patient not taking: Reported on 09/15/2023 08/24/23   Adolph Hoop, PA-C   azithromycin  (ZITHROMAX ) 250 MG tablet Day 1: take 2 tablets. Day 2-5: Take 1 tablet daily. Patient not taking: Reported on 08/05/2023 06/11/23   Adolph Hoop, PA-C  calcium carbonate (TUMS - DOSED IN MG ELEMENTAL CALCIUM) 500 MG chewable tablet Chew 1 tablet by mouth as needed for indigestion or heartburn. Patient not taking: Reported on 05/28/2023    [provider]  cetirizine  (ZYRTEC ) 10 MG tablet Take 10 mg by mouth daily. Patient not taking: Reported on 09/15/2023    [provider]  cetirizine  (ZYRTEC ) 10 MG tablet Take 10 mg by mouth daily. Patient not taking: Reported on 09/15/2023    [provider]  cholecalciferol (VITAMIN D3) 25 MCG (1000 UNIT) tablet Take 1,000 Units by mouth daily. Patient not taking: Reported on 09/15/2023    [provider]  cyclobenzaprine  (FLEXERIL ) 5 MG tablet Take 1-2 tablets (5-10 mg total) by mouth 3 (three) times daily as needed for muscle spasms. Patient not taking: Reported on 05/28/2023 05/06/23   Lacey Pian, MD  famotidine  (PEPCID ) 20 MG tablet Take 20 mg by mouth as needed for heartburn or indigestion. Patient not taking: Reported on 08/05/2023    [provider]  fluconazole  (DIFLUCAN ) 150 MG tablet Take 1 tablet by mouth. Repeat in 3 days if symptoms persists. Patient not taking: Reported on 09/15/2023 09/02/23   Lenord Radon, MD  fluconazole  (DIFLUCAN ) 150 MG tablet Take 1 tablet by mouth. Repeat in 3 days if needed. Patient not taking: Reported on 10/12/2023 09/28/23   Marci Setter, MD  ibuprofen  (ADVIL ) 800 MG tablet Take 1 tablet (800 mg total) by mouth 3 (three) times daily with meals as needed for headache, moderate pain (pain score 4-6) or cramping. Patient not taking: Reported on 09/15/2023 05/05/23   Lacey Pian, MD  metroNIDAZOLE  (FLAGYL ) 500 MG tablet Take 1 tablet (500 mg total) by mouth 2 (two) times daily. Patient not taking: Reported on 09/15/2023 08/05/23   Lucina Sabal, PA-C  nystatin   cream (MYCOSTATIN ) Apply thin layer to the area twice a day Patient not taking: Reported on 09/15/2023 09/01/23   Zelma Hidden, FNP  ondansetron  (ZOFRAN -ODT) 4 MG disintegrating tablet Take 1 tablet (4 mg total) by mouth every 6 (six) hours as needed for nausea. Patient not taking: Reported on 05/28/2023 05/05/23   Lacey Pian, MD  oxyCODONE  (OXY IR/ROXICODONE ) 5 MG immediate release tablet Take 1 tablet (5 mg total) by mouth every 4 (four) hours as needed for severe pain (pain score 7-10) or breakthrough pain. Patient not taking: Reported on 05/28/2023 05/05/23   Lacey Pian, MD  predniSONE  (DELTASONE ) 50 MG tablet Take 1 tablet (50 mg total) by mouth daily with breakfast. Patient not taking: Reported on 09/15/2023 08/24/23   Adolph Hoop, PA-C  promethazine -dextromethorphan (PROMETHAZINE -DM) 6.25-15 MG/5ML syrup Take 5 mLs by mouth 3 (three) times daily as needed for cough. Patient not taking: Reported on 08/05/2023 06/02/23   Adolph Hoop, PA-C  valACYclovir  (VALTREX ) 1000 MG tablet Take 1 tablet (1,000 mg total) by mouth 2 (two) times daily. Take 1 tablet daily for suppression. Take 1 tablet twice a day for ten days for outbreaks. 09/15/23   Smith, [place] , CNM    Family History Family History  Problem Relation Age of Onset   Diabetes Mellitus II Mother    Hypertension Mother    Arthritis Father    Hypertension Father    Stomach cancer Paternal Grandmother    Stomach cancer Maternal Aunt    Breast cancer Neg Hx    Colon cancer Neg Hx    Cervical cancer Neg Hx    Heart disease Neg Hx     Social History Social History   Tobacco Use   Smoking status: Never   Smokeless tobacco: Never  Vaping Use   Vaping status: Some Days  Substance Use Topics   Alcohol use: Yes    Comment: occ   Drug use: Never     Allergies   Dust mite extract, Mixed ragweed, and Venofer  [iron  sucrose]   Review of Systems Review of Systems  Constitutional:  Negative for chills, fatigue and fever.   HENT:  Positive for congestion, sore throat and trouble swallowing. Negative for dental problem, drooling, ear pain, facial swelling, nosebleeds, postnasal drip, rhinorrhea, sinus pressure, sinus pain, sneezing, tinnitus and voice change.   Eyes:  Negative for pain and redness.  Respiratory:  Positive for cough. Negative for shortness of breath and wheezing.   Gastrointestinal:  Negative for abdominal pain, diarrhea, nausea and vomiting.  Musculoskeletal:  Negative for arthralgias and myalgias.  Skin:  Negative for rash.  Neurological:  Negative for light-headedness and headaches.  Hematological:  Negative for adenopathy. Does not bruise/bleed  easily.  Psychiatric/Behavioral:  Negative for confusion and sleep disturbance.      Physical Exam Triage Vital Signs ED Triage Vitals  Encounter Vitals Group     BP 10/12/23 1002 120/84     Systolic BP Percentile --      Diastolic BP Percentile --      Pulse Rate 10/12/23 1002 70     Resp 10/12/23 1002 18     Temp 10/12/23 1002 97.9 F (36.6 C)     Temp Source 10/12/23 1002 Oral     SpO2 10/12/23 1002 98 %     Weight --      Height --      Head Circumference --      Peak Flow --      Pain Score 10/12/23 1000 7     Pain Loc --      Pain Education --      Exclude from Growth Chart --    No data found.  Updated Vital Signs BP 120/84 (BP Location: Right Arm)   Pulse 70   Temp 97.9 F (36.6 C) (Oral)   Resp 18   LMP 10/11/2023 (Exact Date)   SpO2 98%   Visual Acuity Right Eye Distance:   Left Eye Distance:   Bilateral Distance:    Right Eye Near:   Left Eye Near:    Bilateral Near:     Physical Exam Vitals and nursing note reviewed.  Constitutional:      General: She is not in acute distress.    Appearance: Normal appearance. She is not ill-appearing.  HENT:     Head: Normocephalic and atraumatic.     Right Ear: Tympanic membrane and ear canal normal.     Left Ear: Tympanic membrane and ear canal normal.     Nose: No  congestion or rhinorrhea.     Mouth/Throat:     Dentition: Normal dentition. Does not have dentures. No dental tenderness, gingival swelling, dental caries or dental abscesses.     Pharynx: Uvula midline. Posterior oropharyngeal erythema (L side) present. No uvula swelling.     Tonsils: Tonsillar exudate (L side) present. 1+ on the left.  Eyes:     General: No scleral icterus.    Extraocular Movements: Extraocular movements intact.     Conjunctiva/sclera: Conjunctivae normal.  Cardiovascular:     Rate and Rhythm: Normal rate and regular rhythm.     Heart sounds: No murmur heard. Pulmonary:     Effort: Pulmonary effort is normal. No respiratory distress.     Breath sounds: Normal breath sounds. No wheezing or rales.  Musculoskeletal:        General: Normal range of motion.     Cervical back: Normal range of motion. No rigidity.  Lymphadenopathy:     Cervical: Cervical adenopathy present.     Right cervical: No superficial, deep or posterior cervical adenopathy.    Left cervical: Superficial cervical adenopathy present. No deep or posterior cervical adenopathy.  Skin:    Capillary Refill: Capillary refill takes less than 2 seconds.     Coloration: Skin is not jaundiced.     Findings: No rash.  Neurological:     General: No focal deficit present.     Mental Status: She is alert and oriented to person, place, and time.     Motor: No weakness.     Gait: Gait normal.  Psychiatric:        Mood and Affect: Mood normal.  Behavior: Behavior normal.      UC Treatments / Results  Labs (all labs ordered are listed, but only abnormal results are displayed) Labs Reviewed  POCT RAPID STREP A (OFFICE)    EKG   Radiology No results found.  Procedures Procedures (including critical care time)  Medications Ordered in UC Medications - No data to display  Initial Impression / Assessment and Plan / UC Course  I have reviewed the triage vital signs and the nursing  notes.  Pertinent labs & imaging results that were available during my care of the patient were reviewed by me and considered in my medical decision making (see chart for details).     I will swab for strep due to physical No sign of dental abscess/infection Final Clinical Impressions(s) / UC Diagnoses   Final diagnoses:  Sore throat     Discharge Instructions      Likely viral sore throat. Take ibuprofen  or tylenol  as needed Gargle warm salt water Return as needed, or if symptoms fail to improve     ED Prescriptions   None    PDMP not reviewed this encounter.   Lavonia Powers, PA-C 10/12/23 1034

## 2023-11-02 ENCOUNTER — Ambulatory Visit: Admitting: Obstetrics & Gynecology

## 2023-11-02 ENCOUNTER — Encounter: Payer: Self-pay | Admitting: Obstetrics & Gynecology

## 2023-11-02 ENCOUNTER — Other Ambulatory Visit (HOSPITAL_COMMUNITY)
Admission: RE | Admit: 2023-11-02 | Discharge: 2023-11-02 | Disposition: A | Discharge status: Home / Self Care | Source: Ambulatory Visit | Attending: Obstetrics & Gynecology | Admitting: Obstetrics & Gynecology

## 2023-11-02 VITALS — BP 129/90 | HR 102 | Ht 65.0 in | Wt 190.0 lb

## 2023-11-02 DIAGNOSIS — N760 Acute vaginitis: Secondary | ICD-10-CM

## 2023-11-02 NOTE — Progress Notes (Signed)
   GYNECOLOGY OFFICE VISIT NOTE  History:  Alexis [name] is a 23 y.o. [phone] here today for evaluation of vaginal odor, discharge and irritation x 2 weeks.  Reports this happens occasionally.  Uses Dove sensitive skin soap, practices good vulvovaginal hygiene. She denies any abnormal vaginal bleeding, pelvic pain or other concerns.  Past Medical History:  Diagnosis Date   Anxiety    Chronic hypertension 08/27/2020   Genital HSV    GERD (gastroesophageal reflux disease)    History of postpartum hemorrhage, 2022   History of pre-eclampsia 2022   Sinus tachycardia    saw cardiology dr Veryl Gottron shuman 09-14-2020 epic   SVT (supraventricular tachycardia) (HCC) 10/06/2022    Past Surgical History:  Procedure Laterality Date   LAPAROSCOPIC BILATERAL SALPINGECTOMY Bilateral 05/05/2023   Procedure: LAPAROSCOPIC BILATERAL SALPINGECTOMY;  Surgeon: Lacey Pian, MD;  Location: [facility];  Service: Gynecology;  Laterality: Bilateral;   NO PAST SURGERIES     WISDOM TOOTH EXTRACTION  2017   with sedation    The following portions of the patient's history were reviewed and updated as appropriate: allergies, current medications, past family history, past medical history, past social history, past surgical history and problem list.   Health Maintenance:  Normal pap on 02/05/2023.   Review of Systems:  Pertinent items noted in HPI and remainder of comprehensive ROS otherwise negative.  Physical Exam:  BP (!) 129/90   Pulse (!) 102   Ht 5\' 5"  (1.651 m)   Wt 190 lb (86.2 kg)   LMP 10/11/2023 (Exact Date)   BMI 31.62 kg/m  CONSTITUTIONAL: Well-developed, well-nourished female in no acute distress.  MUSCULOSKELETAL: Normal range of motion. No edema noted. NEUROLOGIC: Alert and oriented to person, place, and time. Normal muscle tone coordination. No cranial nerve deficit noted on observation. PSYCHIATRIC: Normal mood and affect. Normal behavior. Normal judgment and  thought content. CARDIOVASCULAR: Normal heart rate noted RESPIRATORY: Effort and breath sounds normal, no problems with respiration noted ABDOMEN: No masses or other overt distention noted on observation. No tenderness.   PELVIC: Normal appearing external genitalia; normal urethral meatus; normal appearing  distal vaginal mucosa. Scant  thin white discharge noted; testing sample obtained.   Performed in the presence of a chaperone  Assessment and Plan:    1. Vulvovaginitis (Primary) Proper vulvar hygiene re-emphasized: discussed avoidance of perfumed soaps, detergents, lotions and any type of douches; in addition to wearing cotton underwear and no underwear at night.  Also recommended cleaning front to back, voiding and cleaning up after intercourse.    Will do regular vaginitis testing, also check for mycoplasma. Will follow up results and manage accordingly. - Cervicovaginal ancillary only - Genital Mycoplasmas NAA, Swab  Routine preventative health maintenance measures emphasized. Please refer to After Visit Summary for other counseling recommendations.   Return for follow up as recommended.    I spent 30 minutes dedicated to the care of this patient including pre-visit review of records, face to face time with the patient discussing her conditions and treatments, post visit ordering of medications and appropriate tests or procedures, coordinating care and documenting this visit encounter.    Lenoard Rad, MD, FACOG Obstetrician & Gynecologist, [facility] for [employer], [facility]

## 2023-11-03 ENCOUNTER — Ambulatory Visit: Payer: Self-pay | Admitting: Obstetrics & Gynecology

## 2023-11-03 DIAGNOSIS — A493 Mycoplasma infection, unspecified site: Secondary | ICD-10-CM

## 2023-11-03 LAB — CERVICOVAGINAL ANCILLARY ONLY
Bacterial Vaginitis (gardnerella): NEGATIVE
Candida Glabrata: NEGATIVE
Candida Vaginitis: NEGATIVE
Chlamydia: NEGATIVE
Comment: NEGATIVE
Comment: NEGATIVE
Comment: NEGATIVE
Comment: NEGATIVE
Comment: NEGATIVE
Comment: NORMAL
Neisseria Gonorrhea: NEGATIVE
Trichomonas: NEGATIVE

## 2023-11-04 LAB — GENITAL MYCOPLASMAS NAA, SWAB
Mycoplasma genitalium NAA: NEGATIVE
Mycoplasma hominis NAA: NEGATIVE
Ureaplasma spp NAA: POSITIVE — AB

## 2023-11-05 ENCOUNTER — Other Ambulatory Visit: Payer: Self-pay | Admitting: Obstetrics & Gynecology

## 2023-11-05 ENCOUNTER — Other Ambulatory Visit: Payer: Self-pay | Admitting: *Deleted

## 2023-11-05 DIAGNOSIS — Z2239 Carrier of other specified bacterial diseases: Secondary | ICD-10-CM

## 2023-11-05 MED ORDER — DOXYCYCLINE HYCLATE 100 MG PO TBEC: 100.0000 mg | DELAYED_RELEASE_TABLET | Freq: Two times a day (BID) | ORAL | 0 refills | Status: DC

## 2023-11-26 ENCOUNTER — Other Ambulatory Visit (HOSPITAL_COMMUNITY)
Admission: RE | Admit: 2023-11-26 | Discharge: 2023-11-26 | Disposition: A | Discharge status: Home / Self Care | Source: Ambulatory Visit | Attending: Obstetrics and Gynecology | Admitting: Obstetrics and Gynecology

## 2023-11-26 ENCOUNTER — Ambulatory Visit: Admitting: Obstetrics and Gynecology

## 2023-11-26 VITALS — BP 139/80 | HR 87 | Ht 65.0 in | Wt 193.0 lb

## 2023-11-26 DIAGNOSIS — N644 Mastodynia: Secondary | ICD-10-CM

## 2023-11-26 DIAGNOSIS — Z113 Encounter for screening for infections with a predominantly sexual mode of transmission: Secondary | ICD-10-CM

## 2023-11-26 DIAGNOSIS — N898 Other specified noninflammatory disorders of vagina: Secondary | ICD-10-CM | POA: Diagnosis not present

## 2023-11-26 NOTE — Progress Notes (Signed)
   ESTABLISHED GYNECOLOGY VISIT Chief Complaint  Patient presents with   Vaginal Discharge   Breast Problem    Subjective:  Alexis [name] is a 23 y.o. [phone] presenting for right breast pain and vaginal discharge  Had nurse visit scheduled for self swab for vaginal discharge. Reports more discharge with itching. Recently treated for ureaplasma but doesn't feel like the treatment changed her symptoms.  Reporting right breast pain that started yesterday, thought she could feel a lump near her axilla. No recent illnesses. No new medications. Patient's last menstrual period was 11/06/2023.   Review of Systems:   Pertinent items are noted in HPI  Pertinent History Reviewed:  Reviewed past medical,surgical, social and family history.  Reviewed problem list, medications and allergies.  Objective:   Vitals:   11/26/23 1404  BP: 139/80  Pulse: 87  Weight: 193 lb (87.5 kg)  Height: 5' 5 (1.651 m)   Physical Examination:   General appearance - well appearing, and in no distress  Mental status - alert, oriented to person, place, and time  Psych:  normal mood and affect  Skin - warm and dry, normal color, no suspicious lesions noted  Breasts- right breast without masses, nipples normal, no skin lesions  Chaperone present for exam  Assessment and Plan:  1. Breast pain, right (Primary) No palpable abnormalities on exam. Will get breast ultrasound to exclude lesion - US  LIMITED ULTRASOUND INCLUDING AXILLA RIGHT BREAST; Future  2. Vaginal discharge Self swab done, will f/u results - Cervicovaginal ancillary only  3. Screening for STD (sexually transmitted disease) - RPR+HBsAg+HCVAb+... - Cervicovaginal ancillary only   No follow-ups on file.  Future Appointments  Date Time Provider Department Center  02/08/2024 10:45 AM Gwenette Lennox, Shellie Dials, DO [facility]    Marci Setter, MD, FACOG Obstetrician & Gynecologist, [facility] for [facility], [facility]

## 2023-11-27 ENCOUNTER — Ambulatory Visit

## 2023-11-27 ENCOUNTER — Ambulatory Visit: Payer: Self-pay | Admitting: Obstetrics and Gynecology

## 2023-11-27 LAB — RPR+HBSAG+HCVAB+...
HIV Screen 4th Generation wRfx: NONREACTIVE
Hep C Virus Ab: NONREACTIVE
Hepatitis B Surface Ag: NEGATIVE
RPR Ser Ql: NONREACTIVE

## 2023-11-30 LAB — CERVICOVAGINAL ANCILLARY ONLY
Bacterial Vaginitis (gardnerella): NEGATIVE
Candida Glabrata: NEGATIVE
Candida Vaginitis: NEGATIVE
Chlamydia: NEGATIVE
Comment: NEGATIVE
Comment: NEGATIVE
Comment: NEGATIVE
Comment: NEGATIVE
Comment: NEGATIVE
Comment: NORMAL
Neisseria Gonorrhea: NEGATIVE
Trichomonas: NEGATIVE

## 2023-12-03 ENCOUNTER — Ambulatory Visit
Admission: EM | Admit: 2023-12-03 | Discharge: 2023-12-03 | Disposition: A | Discharge status: Home / Self Care | Attending: Emergency Medicine | Admitting: Emergency Medicine

## 2023-12-03 DIAGNOSIS — J069 Acute upper respiratory infection, unspecified: Secondary | ICD-10-CM | POA: Diagnosis not present

## 2023-12-03 LAB — POCT RAPID STREP A (OFFICE): Rapid Strep A Screen: NEGATIVE

## 2023-12-03 MED ORDER — PREDNISONE 20 MG PO TABS: 40.0000 mg | ORAL_TABLET | Freq: Every day | ORAL | 0 refills | Status: DC

## 2023-12-03 MED ORDER — IPRATROPIUM BROMIDE 0.03 % NA SOLN: 2.0000 | Freq: Two times a day (BID) | NASAL | 12 refills | Status: AC

## 2023-12-03 NOTE — ED Triage Notes (Addendum)
 Pt present with c/o nasal congestion and cough  x four days. Feels her throat is tingling, c/o chills and body aches. States she has been coughing up green mucus.  Pt states her kids recently had a runny nose.   Home interventions: Dayquil and Ibuprofen 

## 2023-12-03 NOTE — ED Provider Notes (Signed)
 [facility]    CSN: [id number] Arrival date & time: 12/03/23  0901      History   Chief Complaint Chief Complaint  Patient presents with   Cough    HPI Alexis [name] is a 23 y.o. female.   Patient presents for evaluation of increased fatigue, chills, generalized bodyaches, nasal congestion, postnasal drip, sinus pressure behind the eyes, sore throat described as a tingling sensation, productive cough with green sputum and intermittent wheezing present for 3 days.  Known sick contacts in household, tested for COVID and flu which were negative.  Has attempted use of DayQuil and ibuprofen .  Tolerating food and liquids.  Denies shortness of breath.       Past Medical History:  Diagnosis Date   Anxiety    Chronic hypertension 08/27/2020   Genital HSV    GERD (gastroesophageal reflux disease)    History of postpartum hemorrhage, 2022   History of pre-eclampsia 2022   Sinus tachycardia    saw cardiology dr Veryl Gottron shuman 09-14-2020 epic   SVT (supraventricular tachycardia) (HCC) 10/06/2022    Patient Active Problem List   Diagnosis Date Noted   SVT (supraventricular tachycardia) (HCC) 10/06/2022   Anxiety    Chronic hypertension 08/27/2020   GERD (gastroesophageal reflux disease)     Past Surgical History:  Procedure Laterality Date   LAPAROSCOPIC BILATERAL SALPINGECTOMY Bilateral 05/05/2023   Procedure: LAPAROSCOPIC BILATERAL SALPINGECTOMY;  Surgeon: Lacey Pian, MD;  Location: [facility];  Service: Gynecology;  Laterality: Bilateral;   NO PAST SURGERIES     WISDOM TOOTH EXTRACTION  2017   with sedation    OB History     Gravida  2   Para  2   Term  1   Preterm  1   AB  0   Living  3      SAB  0   IAB  0   Ectopic  0   Multiple  1   Live Births  3            Home Medications    Prior to Admission medications   Medication Sig Start Date End Date Taking? Authorizing Provider  albuterol  (VENTOLIN   HFA) 108 (90 Base) MCG/ACT inhaler Inhale 1-2 puffs into the lungs every 6 (six) hours as needed for wheezing or shortness of breath. 08/24/23   Adolph Hoop, PA-C  cetirizine  (ZYRTEC ) 10 MG tablet Take 10 mg by mouth daily.    [provider]  doxycycline  (DORYX ) 100 MG EC tablet Take 1 tablet (100 mg total) by mouth 2 (two) times daily. Patient not taking: Reported on 11/26/2023 11/05/23   Anyanwu, Ugonna A, MD  valACYclovir  (VALTREX ) 1000 MG tablet Take 1 tablet (1,000 mg total) by mouth 2 (two) times daily. Take 1 tablet daily for suppression. Take 1 tablet twice a day for ten days for outbreaks. 09/15/23   Smith, [place] , CNM    Family History Family History  Problem Relation Age of Onset   Diabetes Mellitus II Mother    Hypertension Mother    Arthritis Father    Hypertension Father    Stomach cancer Paternal Grandmother    Stomach cancer Maternal Aunt    Breast cancer Neg Hx    Colon cancer Neg Hx    Cervical cancer Neg Hx    Heart disease Neg Hx     Social History Social History   Tobacco Use   Smoking status: Never   Smokeless tobacco: Never  Vaping  Use   Vaping status: Some Days  Substance Use Topics   Alcohol use: Yes    Comment: occ   Drug use: Never     Allergies   Dust mite extract, Mixed ragweed, and Venofer  [iron  sucrose]   Review of Systems Review of Systems  Constitutional:  Positive for chills and fatigue. Negative for activity change, appetite change, diaphoresis, fever and unexpected weight change.  HENT:  Positive for congestion, postnasal drip, sinus pain and sore throat. Negative for dental problem, drooling, ear discharge, ear pain, facial swelling, hearing loss, mouth sores, nosebleeds, rhinorrhea, sinus pressure, sneezing, tinnitus, trouble swallowing and voice change.   Respiratory:  Positive for cough and wheezing. Negative for apnea, choking, chest tightness, shortness of breath and stridor.   Cardiovascular: Negative.    Gastrointestinal: Negative.   Skin: Negative.   Neurological: Negative.      Physical Exam Triage Vital Signs ED Triage Vitals  Encounter Vitals Group     BP 12/03/23 0930 (!) 137/90     Girls Systolic BP Percentile --      Girls Diastolic BP Percentile --      Boys Systolic BP Percentile --      Boys Diastolic BP Percentile --      Pulse Rate 12/03/23 0930 92     Resp 12/03/23 0930 17     Temp 12/03/23 0930 98.5 F (36.9 C)     Temp Source 12/03/23 0930 Oral     SpO2 12/03/23 0930 98 %     Weight --      Height --      Head Circumference --      Peak Flow --      Pain Score 12/03/23 0929 3     Pain Loc --      Pain Education --      Exclude from Growth Chart --    No data found.  Updated Vital Signs BP (!) 137/90 (BP Location: Left Arm)   Pulse 92   Temp 98.5 F (36.9 C) (Oral)   Resp 17   LMP 11/30/2023 (Exact Date)   SpO2 98%   Breastfeeding No   Visual Acuity Right Eye Distance:   Left Eye Distance:   Bilateral Distance:    Right Eye Near:   Left Eye Near:    Bilateral Near:     Physical Exam Constitutional:      Appearance: Normal appearance.  HENT:     Right Ear: Tympanic membrane, ear canal and external ear normal.     Left Ear: Tympanic membrane, ear canal and external ear normal.     Nose: Congestion present.     Mouth/Throat:     Mouth: Mucous membranes are moist.     Pharynx: No oropharyngeal exudate or posterior oropharyngeal erythema.   Eyes:     Extraocular Movements: Extraocular movements intact.    Cardiovascular:     Rate and Rhythm: Normal rate and regular rhythm.     Pulses: Normal pulses.     Heart sounds: Normal heart sounds.  Pulmonary:     Effort: Pulmonary effort is normal.     Breath sounds: Normal breath sounds.   Musculoskeletal:     Cervical back: Normal range of motion.  Lymphadenopathy:     Cervical: Cervical adenopathy present.   Neurological:     Mental Status: She is alert and oriented to person, place,  and time. Mental status is at baseline.      UC Treatments / Results  Labs (all labs ordered are listed, but only abnormal results are displayed) Labs Reviewed - No data to display  EKG   Radiology No results found.  Procedures Procedures (including critical care time)  Medications Ordered in UC Medications - No data to display  Initial Impression / Assessment and Plan / UC Course  I have reviewed the triage vital signs and the nursing notes.  Pertinent labs & imaging results that were available during my care of the patient were reviewed by me and considered in my medical decision making (see chart for details).  Viral URI with cough  Patient is in no signs of distress nor toxic appearing.  Vital signs are stable.  Low suspicion for pneumonia, pneumothorax or bronchitis and therefore will defer imaging.  Etiology most likely viral, known sick contact in household, rapid strep test negative, discussed findings.  Prescribed prednisone  and Atrovent  nasal spray.May use additional over-the-counter medications as needed for supportive care.  May follow-up with urgent care as needed if symptoms persist or worsen.  Note given.   Final Clinical Impressions(s) / UC Diagnoses   Final diagnoses:  None   Discharge Instructions   None    ED Prescriptions   None    PDMP not reviewed this encounter.   Reena Canning, NP 12/03/23 1004

## 2023-12-03 NOTE — Discharge Instructions (Signed)
 Your symptoms today are most likely being caused by a virus and should steadily improve in time it can take up to 7 to 10 days before you truly start to see a turnaround however things will get better  Rapid strep test negative  Begin prednisone  every morning with food for 5 days to open and relax airway, should settle harshness of cough, burning in the chest as well as sinus pressure  May use nasal spray twice daily to help clear out the sinuses and reduce congestion    You can take Tylenol  and/or Ibuprofen  as needed for fever reduction and pain relief.   For cough: honey 1/2 to 1 teaspoon (you can dilute the honey in water or another fluid).  You can also use guaifenesin and dextromethorphan for cough. You can use a humidifier for chest congestion and cough.  If you don't have a humidifier, you can sit in the bathroom with the hot shower running.      For sore throat: try warm salt water gargles, cepacol lozenges, throat spray, warm tea or water with lemon/honey, popsicles or ice, or OTC cold relief medicine for throat discomfort.   For congestion: take a daily anti-histamine like Zyrtec , Claritin , and a oral decongestant, such as pseudoephedrine .  You can also use Flonase  1-2 sprays in each nostril daily.   It is important to stay hydrated: drink plenty of fluids (water, gatorade/powerade/pedialyte, juices, or teas) to keep your throat moisturized and help further relieve irritation/discomfort.

## 2023-12-05 ENCOUNTER — Ambulatory Visit
Admission: EM | Admit: 2023-12-05 | Discharge: 2023-12-05 | Disposition: A | Discharge status: Home / Self Care | Attending: Internal Medicine | Admitting: Internal Medicine

## 2023-12-05 DIAGNOSIS — J208 Acute bronchitis due to other specified organisms: Secondary | ICD-10-CM

## 2023-12-05 DIAGNOSIS — J22 Unspecified acute lower respiratory infection: Secondary | ICD-10-CM

## 2023-12-05 MED ORDER — METHYLPREDNISOLONE ACETATE 80 MG/ML IJ SUSP
80.0000 mg | Freq: Once | INTRAMUSCULAR | Status: AC
  Administered 2023-12-05: 80 mg via INTRAMUSCULAR

## 2023-12-05 MED ORDER — ALBUTEROL SULFATE (2.5 MG/3ML) 0.083% IN NEBU
2.5000 mg | INHALATION_SOLUTION | Freq: Once | RESPIRATORY_TRACT | Status: AC
  Administered 2023-12-05: 2.5 mg via RESPIRATORY_TRACT

## 2023-12-05 MED ORDER — AZITHROMYCIN 250 MG PO TABS: ORAL_TABLET | ORAL | 0 refills | Status: DC

## 2023-12-05 MED ORDER — ALBUTEROL SULFATE HFA 108 (90 BASE) MCG/ACT IN AERS: 1.0000 | INHALATION_SPRAY | Freq: Four times a day (QID) | RESPIRATORY_TRACT | 0 refills | Status: DC | PRN

## 2023-12-05 NOTE — ED Triage Notes (Signed)
 Pt c/o SHOB  started yesterday-states she was seen here 6/19 for prod cough, head/chest congestion-states she is taking rx prednisone  and nasal spray-NAD-steady gait

## 2023-12-05 NOTE — Discharge Instructions (Addendum)
 Symptoms and physical exam findings are consistent with a lower respiratory infection likely acute bronchitis versus pneumonia.  We have given a nebulizing treatment and steroid injection today to help improve the airways.  Given the severity and duration of symptoms we will treat with antibiotics.  We do recommend a chest x-ray and as we do not have x-ray here today you will go to med center [facility] to have this done.  We will treat with the following: Albuterol  nebulizing treatment given today in clinic Medrol  injection given today in clinic.  This is a steroid to help with inflammation in the airways Go to med [facility] for an outpatient chest x-ray today.  You do not need to be seen as a patient in the emergency room.  Let the front desk know that you are there for an outpatient x-ray only.  If the x-ray does show pneumonia then would recommend repeating the chest x-ray in 2 to 3 weeks to ensure complete resolution of the pneumonia. Azithromycin  250mg  Take 2 tablets today and the 1 tablet daily for 4 more days.  This is an antibiotic. Albuterol  inhaler 1-2 puffs every 6 hours as needed for wheezing/shortness of breath. Continue prednisone  as prescribed Continue Atrovent  nasal spray as prescribed Make sure to take cetirizine  (zyrtec ) daily Rest and stay hydrated.   Recommend discussing recurrent bronchitis with your primary care physician at your next appointment Return to urgent care or PCP if symptoms worsen or fail to resolve.

## 2023-12-05 NOTE — ED Provider Notes (Signed)
 [facility]    CSN: [id number] Arrival date & time: 12/05/23  1222      History   Chief Complaint Chief Complaint  Patient presents with   Shortness of Breath    HPI Alexis [name] is a 23 y.o. female.   23 year old female who presents to urgent care with complaints of shortness of breath and wheezing.  She reports that this started yesterday.  Last night she was unable to sleep very well because she felt like she could not breathe when she lays down.  She was seen here on 6/19 for nasal congestion, cough, fatigue and generalized bodyaches.  She had already tested herself and was negative for flu and COVID.  She reports that the nasal congestion has gotten somewhat better but the shortness of breath, wheezing, fatigue have all gotten much worse.  She does have a history of recurrent bronchitis.  She has not been evaluated for this by her primary care doctor.  She does report that the shortness of breath is worse with activity and with lying down.  She denies fevers or chills.  She is eating and drinking but not as much as usual.  She is currently on prednisone  and Atrovent  nasal spray.     Shortness of Breath Associated symptoms: cough and wheezing   Associated symptoms: no abdominal pain, no chest pain, no ear pain, no fever, no rash, no sore throat and no vomiting     Past Medical History:  Diagnosis Date   Anxiety    Chronic hypertension 08/27/2020   Genital HSV    GERD (gastroesophageal reflux disease)    History of postpartum hemorrhage, 2022   History of pre-eclampsia 2022   Sinus tachycardia    saw cardiology dr lonni shuman 09-14-2020 epic   SVT (supraventricular tachycardia) (HCC) 10/06/2022    Patient Active Problem List   Diagnosis Date Noted   SVT (supraventricular tachycardia) (HCC) 10/06/2022   Anxiety    Chronic hypertension 08/27/2020   GERD (gastroesophageal reflux disease)     Past Surgical History:  Procedure Laterality  Date   LAPAROSCOPIC BILATERAL SALPINGECTOMY Bilateral 05/05/2023   Procedure: LAPAROSCOPIC BILATERAL SALPINGECTOMY;  Surgeon: Cleatus Moccasin, MD;  Location: [facility];  Service: Gynecology;  Laterality: Bilateral;   NO PAST SURGERIES     WISDOM TOOTH EXTRACTION  2017   with sedation    OB History     Gravida  2   Para  2   Term  1   Preterm  1   AB  0   Living  3      SAB  0   IAB  0   Ectopic  0   Multiple  1   Live Births  3            Home Medications    Prior to Admission medications   Medication Sig Start Date End Date Taking? Authorizing Provider  albuterol  (VENTOLIN  HFA) 108 (90 Base) MCG/ACT inhaler Inhale 1-2 puffs into the lungs every 6 (six) hours as needed for wheezing or shortness of breath. 12/05/23  Yes Teagan Ozawa A, PA-C  azithromycin  (ZITHROMAX ) 250 MG tablet Take first 2 tablets together, then 1 every day until finished. 12/05/23  Yes Johnanthony Wilden A, PA-C  albuterol  (VENTOLIN  HFA) 108 (90 Base) MCG/ACT inhaler Inhale 1-2 puffs into the lungs every 6 (six) hours as needed for wheezing or shortness of breath. 08/24/23   Christopher Savannah, PA-C  cetirizine  (ZYRTEC ) 10 MG tablet  Take 10 mg by mouth daily.    [provider]  ipratropium (ATROVENT ) 0.03 % nasal spray Place 2 sprays into both nostrils every 12 (twelve) hours. 12/03/23   Issai Werling, Shelba SAUNDERS, NP  predniSONE  (DELTASONE ) 20 MG tablet Take 2 tablets (40 mg total) by mouth daily. 12/03/23   Kimberleigh Mehan, Shelba SAUNDERS, NP  valACYclovir  (VALTREX ) 1000 MG tablet Take 1 tablet (1,000 mg total) by mouth 2 (two) times daily. Take 1 tablet daily for suppression. Take 1 tablet twice a day for ten days for outbreaks. 09/15/23   Smith, [place] , CNM    Family History Family History  Problem Relation Age of Onset   Diabetes Mellitus II Mother    Hypertension Mother    Arthritis Father    Hypertension Father    Stomach cancer Paternal Grandmother    Stomach cancer Maternal Aunt     Breast cancer Neg Hx    Colon cancer Neg Hx    Cervical cancer Neg Hx    Heart disease Neg Hx     Social History Social History   Tobacco Use   Smoking status: Never   Smokeless tobacco: Never  Vaping Use   Vaping status: Some Days  Substance Use Topics   Alcohol use: Yes    Comment: occ   Drug use: Never     Allergies   Dust mite extract, Mixed ragweed, and Venofer  [iron  sucrose]   Review of Systems Review of Systems  Constitutional:  Positive for appetite change and fatigue. Negative for chills and fever.  HENT:  Positive for congestion. Negative for ear pain and sore throat.   Eyes:  Negative for pain and visual disturbance.  Respiratory:  Positive for cough, chest tightness, shortness of breath and wheezing.   Cardiovascular:  Negative for chest pain and palpitations.  Gastrointestinal:  Negative for abdominal pain and vomiting.  Genitourinary:  Negative for dysuria and hematuria.  Musculoskeletal:  Negative for arthralgias and back pain.  Skin:  Negative for color change and rash.  Neurological:  Negative for seizures and syncope.  All other systems reviewed and are negative.    Physical Exam Triage Vital Signs ED Triage Vitals  Encounter Vitals Group     BP 12/05/23 1249 135/80     Girls Systolic BP Percentile --      Girls Diastolic BP Percentile --      Boys Systolic BP Percentile --      Boys Diastolic BP Percentile --      Pulse Rate 12/05/23 1249 80     Resp 12/05/23 1249 16     Temp 12/05/23 1249 98.2 F (36.8 C)     Temp Source 12/05/23 1249 Oral     SpO2 12/05/23 1249 98 %     Weight --      Height --      Head Circumference --      Peak Flow --      Pain Score 12/05/23 1247 0     Pain Loc --      Pain Education --      Exclude from Growth Chart --    No data found.  Updated Vital Signs BP 135/80 (BP Location: Right Arm)   Pulse 80   Temp 98.2 F (36.8 C) (Oral)   Resp 16   LMP 11/30/2023   SpO2 98%   Visual Acuity Right Eye  Distance:   Left Eye Distance:   Bilateral Distance:    Right Eye Near:   Left  Eye Near:    Bilateral Near:     Physical Exam Vitals and nursing note reviewed.  Constitutional:      General: She is not in acute distress.    Appearance: She is well-developed.  HENT:     Head: Normocephalic and atraumatic.   Eyes:     Conjunctiva/sclera: Conjunctivae normal.    Cardiovascular:     Rate and Rhythm: Normal rate and regular rhythm.     Heart sounds: No murmur heard. Pulmonary:     Effort: Pulmonary effort is normal. No respiratory distress.     Breath sounds: Examination of the right-upper field reveals wheezing. Examination of the left-upper field reveals wheezing. Examination of the right-middle field reveals wheezing. Examination of the left-middle field reveals wheezing. Examination of the right-lower field reveals wheezing and rhonchi. Examination of the left-lower field reveals wheezing and rhonchi. Wheezing and rhonchi present. No decreased breath sounds.  Abdominal:     Palpations: Abdomen is soft.     Tenderness: There is no abdominal tenderness.   Musculoskeletal:        General: No swelling.     Cervical back: Neck supple.   Skin:    General: Skin is warm and dry.     Capillary Refill: Capillary refill takes less than 2 seconds.   Neurological:     Mental Status: She is alert.   Psychiatric:        Mood and Affect: Mood normal.      UC Treatments / Results  Labs (all labs ordered are listed, but only abnormal results are displayed) Labs Reviewed - No data to display  EKG   Radiology No results found.  Procedures Procedures (including critical care time)  Medications Ordered in UC Medications  albuterol  (PROVENTIL ) (2.5 MG/3ML) 0.083% nebulizer solution 2.5 mg (2.5 mg Nebulization Given 12/05/23 1306)  methylPREDNISolone  acetate (DEPO-MEDROL ) injection 80 mg (80 mg Intramuscular Given 12/05/23 1306)    Initial Impression / Assessment and Plan / UC  Course  I have reviewed the triage vital signs and the nursing notes.  Pertinent labs & imaging results that were available during my care of the patient were reviewed by me and considered in my medical decision making (see chart for details).     Acute bronchitis due to other specified organisms  Lower respiratory infection   Symptoms and physical exam findings are consistent with a lower respiratory infection likely acute bronchitis versus pneumonia.  We have given a nebulizing treatment and steroid injection today to help improve the airways.  Given the severity and duration of symptoms we will treat with antibiotics.  We do recommend a chest x-ray and as we do not have x-ray here today you will go to med center [facility] to have this done.  We will treat with the following: Albuterol  nebulizing treatment given today in clinic with improvement in wheezing significantly but there is still some coarse breath sounds in the bases bilateral. Medrol  injection given today in clinic.  This is a steroid to help with inflammation in the airways Go to med [facility] for an outpatient chest x-ray today.  You do not need to be seen as a patient in the emergency room.  Let the front desk know that you are there for an outpatient x-ray only.  If the x-ray does show pneumonia then would recommend repeating the chest x-ray in 2 to 3 weeks to ensure complete resolution of the pneumonia. Azithromycin  250mg  Take 2 tablets today and the  1 tablet daily for 4 more days.  This is an antibiotic. Albuterol  inhaler 1-2 puffs every 6 hours as needed for wheezing/shortness of breath. Continue prednisone  as prescribed Continue Atrovent  nasal spray as prescribed Make sure to take cetirizine  (zyrtec ) daily Rest and stay hydrated.   Recommend discussing recurrent bronchitis with your primary care physician at your next appointment Return to urgent care or PCP if symptoms worsen or fail to resolve.    Final  Clinical Impressions(s) / UC Diagnoses   Final diagnoses:  Acute bronchitis due to other specified organisms  Lower respiratory infection     Discharge Instructions      Symptoms and physical exam findings are consistent with a lower respiratory infection likely acute bronchitis versus pneumonia.  We have given a nebulizing treatment and steroid injection today to help improve the airways.  Given the severity and duration of symptoms we will treat with antibiotics.  We do recommend a chest x-ray and as we do not have x-ray here today you will go to med center [facility] to have this done.  We will treat with the following: Albuterol  nebulizing treatment given today in clinic Medrol  injection given today in clinic.  This is a steroid to help with inflammation in the airways Go to med [facility] for an outpatient chest x-ray today.  You do not need to be seen as a patient in the emergency room.  Let the front desk know that you are there for an outpatient x-ray only.  If the x-ray does show pneumonia then would recommend repeating the chest x-ray in 2 to 3 weeks to ensure complete resolution of the pneumonia. Azithromycin  250mg  Take 2 tablets today and the 1 tablet daily for 4 more days.  This is an antibiotic. Albuterol  inhaler 1-2 puffs every 6 hours as needed for wheezing/shortness of breath. Continue prednisone  as prescribed Continue Atrovent  nasal spray as prescribed Make sure to take cetirizine  (zyrtec ) daily Rest and stay hydrated.   Recommend discussing recurrent bronchitis with your primary care physician at your next appointment Return to urgent care or PCP if symptoms worsen or fail to resolve.       ED Prescriptions     Medication Sig Dispense Auth. Provider   azithromycin  (ZITHROMAX ) 250 MG tablet Take first 2 tablets together, then 1 every day until finished. 6 tablet Teresa Almarie LABOR, PA-C   albuterol  (VENTOLIN  HFA) 108 (90 Base) MCG/ACT inhaler Inhale 1-2 puffs  into the lungs every 6 (six) hours as needed for wheezing or shortness of breath. 6.7 g Teresa Almarie LABOR, [place]      PDMP not reviewed this encounter.   Teresa Almarie LABOR, PA-C 12/05/23 1332

## 2023-12-09 ENCOUNTER — Ambulatory Visit: Admitting: Obstetrics & Gynecology

## 2023-12-11 ENCOUNTER — Other Ambulatory Visit

## 2024-01-14 ENCOUNTER — Other Ambulatory Visit (HOSPITAL_COMMUNITY)
Admission: RE | Admit: 2024-01-14 | Discharge: 2024-01-14 | Disposition: A | Discharge status: Home / Self Care | Source: Ambulatory Visit | Attending: Family Medicine | Admitting: Family Medicine

## 2024-01-14 ENCOUNTER — Ambulatory Visit

## 2024-01-14 VITALS — BP 130/75 | HR 93 | Ht 66.0 in | Wt 196.0 lb

## 2024-01-14 DIAGNOSIS — N898 Other specified noninflammatory disorders of vagina: Secondary | ICD-10-CM | POA: Diagnosis present

## 2024-01-14 NOTE — Progress Notes (Signed)
 SUBJECTIVE:  23 y.o. female complains of white vaginal discharge for 3-4 day(s) Denies abnormal vaginal bleeding or significant pelvic pain or fever.Denies history of known exposure to STD.  Patient's last menstrual period was 12/25/2023 (exact date).  OBJECTIVE:  She appears well  ASSESSMENT:  Vaginal Discharge    PLAN:  GC, chlamydia, trichomonas, BVAG, CVAG probe sent to lab. Treatment: To be determined once lab results are received ROV prn if symptoms persist or worsen.

## 2024-01-15 LAB — CERVICOVAGINAL ANCILLARY ONLY
Bacterial Vaginitis (gardnerella): NEGATIVE
Candida Glabrata: NEGATIVE
Candida Vaginitis: NEGATIVE
Chlamydia: NEGATIVE
Comment: NEGATIVE
Comment: NEGATIVE
Comment: NEGATIVE
Comment: NEGATIVE
Comment: NEGATIVE
Comment: NORMAL
Neisseria Gonorrhea: NEGATIVE
Trichomonas: NEGATIVE

## 2024-01-18 ENCOUNTER — Ambulatory Visit: Payer: Self-pay | Admitting: Family Medicine

## 2024-01-26 ENCOUNTER — Other Ambulatory Visit (HOSPITAL_COMMUNITY)
Admission: RE | Admit: 2024-01-26 | Discharge: 2024-01-26 | Disposition: A | Discharge status: Home / Self Care | Source: Ambulatory Visit | Attending: Obstetrics and Gynecology | Admitting: Obstetrics and Gynecology

## 2024-01-26 ENCOUNTER — Ambulatory Visit: Admitting: Obstetrics and Gynecology

## 2024-01-26 VITALS — BP 122/78 | HR 97 | Wt 194.0 lb

## 2024-01-26 DIAGNOSIS — N898 Other specified noninflammatory disorders of vagina: Secondary | ICD-10-CM

## 2024-01-26 NOTE — Progress Notes (Signed)
   GYNECOLOGY PROGRESS NOTE  History:  23 y.o. G2P1103 presents to [facility] office today for problem gyn visit. She reports continued abnormal discharge, itching, & vaginal odor. She reports a long history of this occurring off/on. 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 She was recently tested on 7/31 for BV, yeast & STIs- swab came back negative. She would like repeat ureaplasma testing today. Patient states her partner was not treated previously.    The following portions of the patient's history were reviewed and updated as appropriate: allergies, current medications, past family history, past medical history, past social history, past surgical history and problem list. Last pap smear on 01/2023 was normal. Health Maintenance Due  Topic Date Due   HPV VACCINES (1 - 3-dose series) Never done   Meningococcal B Vaccine (1 of 2 - Standard) Never done   Hepatitis B Vaccines (1 of 3 - 19+ 3-dose series) Never done   COVID-19 Vaccine (4 - 2024-25 season) 02/15/2023   INFLUENZA VACCINE  01/15/2024     Review of Systems:  Pertinent items are noted in HPI.   Objective:  Physical Exam Blood pressure 122/78, pulse 97, weight 88 kg, last menstrual period 01/18/2024, not currently breastfeeding. VS reviewed, nursing note reviewed,  Constitutional: well developed, well nourished, no distress HEENT: normocephalic CV: normal rate Pulm/chest wall: normal effort Breast Exam: deferred Abdomen: soft Neuro: alert and oriented x 3 Skin: warm, dry Psych: affect normal Pelvic exam: Cervix pink, visually closed, without lesion, moderate amount of thin white discharge, vaginal walls and external genitalia normal  Assessment & Plan:  1. Vaginal discharge (Primary) Discussed if swab comes back + for ureaplasma again, would recommend partner treatment followed by abstaining from intercourse during treatment and 1  week after.  - Mycoplasma / ureaplasma culture - Cervicovaginal ancillary only( [place])  2. Vaginal irritation - Mycoplasma / [facility]plasma [facility]   Elenor Mole, [facility] 01/26/24

## 2024-01-28 LAB — CERVICOVAGINAL ANCILLARY ONLY
Bacterial Vaginitis (gardnerella): NEGATIVE
Candida Glabrata: NEGATIVE
Candida Vaginitis: NEGATIVE
Comment: NEGATIVE
Comment: NEGATIVE
Comment: NEGATIVE

## 2024-01-29 ENCOUNTER — Ambulatory Visit: Payer: Self-pay | Admitting: Obstetrics and Gynecology

## 2024-02-01 ENCOUNTER — Other Ambulatory Visit: Payer: Self-pay

## 2024-02-01 DIAGNOSIS — A493 Mycoplasma infection, unspecified site: Secondary | ICD-10-CM

## 2024-02-01 LAB — MYCOPLASMA / UREAPLASMA CULTURE
Mycoplasma hominis Culture: NEGATIVE
Ureaplasma urealyticum: POSITIVE — AB

## 2024-02-01 MED ORDER — DOXYCYCLINE HYCLATE 100 MG PO CAPS: 100.0000 mg | ORAL_CAPSULE | Freq: Two times a day (BID) | ORAL | 1 refills | Status: AC

## 2024-02-02 ENCOUNTER — Ambulatory Visit: Admitting: Obstetrics and Gynecology

## 2024-02-03 ENCOUNTER — Other Ambulatory Visit (HOSPITAL_COMMUNITY)
Admission: RE | Admit: 2024-02-03 | Discharge: 2024-02-03 | Disposition: A | Discharge status: Home / Self Care | Source: Ambulatory Visit | Attending: Family Medicine | Admitting: Family Medicine

## 2024-02-03 ENCOUNTER — Ambulatory Visit: Payer: Self-pay | Admitting: Family Medicine

## 2024-02-03 ENCOUNTER — Ambulatory Visit (INDEPENDENT_AMBULATORY_CARE_PROVIDER_SITE_OTHER): Admitting: Family Medicine

## 2024-02-03 ENCOUNTER — Encounter: Payer: Self-pay | Admitting: Family Medicine

## 2024-02-03 ENCOUNTER — Encounter: Admitting: Family Medicine

## 2024-02-03 VITALS — BP 118/76 | HR 63 | Temp 98.0°F | Resp 16 | Ht 65.0 in | Wt 198.2 lb

## 2024-02-03 VITALS — BP 125/81 | HR 78 | Ht 65.0 in | Wt 198.0 lb

## 2024-02-03 DIAGNOSIS — Z114 Encounter for screening for human immunodeficiency virus [HIV]: Secondary | ICD-10-CM

## 2024-02-03 DIAGNOSIS — I471 Supraventricular tachycardia, unspecified: Secondary | ICD-10-CM

## 2024-02-03 DIAGNOSIS — Z01419 Encounter for gynecological examination (general) (routine) without abnormal findings: Secondary | ICD-10-CM

## 2024-02-03 DIAGNOSIS — Z0000 Encounter for general adult medical examination without abnormal findings: Secondary | ICD-10-CM | POA: Diagnosis present

## 2024-02-03 DIAGNOSIS — Z1331 Encounter for screening for depression: Secondary | ICD-10-CM | POA: Diagnosis not present

## 2024-02-03 DIAGNOSIS — F419 Anxiety disorder, unspecified: Secondary | ICD-10-CM | POA: Diagnosis not present

## 2024-02-03 LAB — COMPREHENSIVE METABOLIC PANEL WITH GFR
ALT: 13 U/L (ref 0–35)
AST: 16 U/L (ref 0–37)
Albumin: 4.5 g/dL (ref 3.5–5.2)
Alkaline Phosphatase: 50 U/L (ref 39–117)
BUN: 7 mg/dL (ref 6–23)
CO2: 27 meq/L (ref 19–32)
Calcium: 9.2 mg/dL (ref 8.4–10.5)
Chloride: 104 meq/L (ref 96–112)
Creatinine, Ser: 0.77 mg/dL (ref 0.40–1.20)
GFR: 108.98 mL/min (ref >60.00)
Glucose, Bld: 82 mg/dL (ref 70–99)
Potassium: 3.9 meq/L (ref 3.5–5.1)
Sodium: 137 meq/L (ref 135–145)
Total Bilirubin: 0.4 mg/dL (ref 0.2–1.2)
Total Protein: 7.4 g/dL (ref 6.0–8.3)

## 2024-02-03 LAB — LIPID PANEL
Cholesterol: 159 mg/dL (ref 0–200)
HDL: 47.3 mg/dL (ref >39.00)
LDL Cholesterol: 93 mg/dL (ref 0–99)
NonHDL: 111.32
Total CHOL/HDL Ratio: 3
Triglycerides: 91 mg/dL (ref 0.0–149.0)
VLDL: 18.2 mg/dL (ref 0.0–40.0)

## 2024-02-03 LAB — CERVICOVAGINAL ANCILLARY ONLY
Chlamydia: NEGATIVE
Comment: NEGATIVE
Comment: NEGATIVE
Comment: NORMAL
Neisseria Gonorrhea: NEGATIVE
Trichomonas: NEGATIVE

## 2024-02-03 LAB — TSH: TSH: 2.01 u[IU]/mL (ref 0.35–5.50)

## 2024-02-03 LAB — CBC
HCT: 38 % (ref 36.0–46.0)
Hemoglobin: 12.4 g/dL (ref 12.0–15.0)
MCHC: 32.7 g/dL (ref 30.0–36.0)
MCV: 92.4 fl (ref 78.0–100.0)
Platelets: 302 K/uL (ref 150.0–400.0)
RBC: 4.11 Mil/uL (ref 3.87–5.11)
RDW: 12.7 % (ref 11.5–15.5)
WBC: 4 K/uL (ref 4.0–10.5)

## 2024-02-03 LAB — MAGNESIUM: Magnesium: 1.8 mg/dL (ref 1.5–2.5)

## 2024-02-03 LAB — T4, FREE: Free T4: 0.76 ng/dL (ref 0.60–1.60)

## 2024-02-03 MED ORDER — METOPROLOL SUCCINATE ER 25 MG PO TB24: 12.5000 mg | ORAL_TABLET | Freq: Every day | ORAL | 1 refills | Status: DC

## 2024-02-03 NOTE — Progress Notes (Signed)
 Chief Complaint  Patient presents with   Annual Exam    CPE     Well Woman Alexis [name] is here for a complete physical.   Her last physical was >1 year ago.  Current diet: in general, diet is fair. Current exercise: cardio, stair master, wt lifting. Contraception? No; had a tubal ligation Patient's last menstrual period was 01/18/2024 (exact date). Fatigue out of ordinary? No Seatbelt? Yes Advanced directive? No  Health Maintenance Pap/HPV- Yes Tetanus- Yes HIV screening- Yes Hep C screening- Yes  Past Medical History:  Diagnosis Date   Anxiety    Chronic hypertension 08/27/2020   Genital HSV    GERD (gastroesophageal reflux disease)    History of postpartum hemorrhage, 2022   History of pre-eclampsia 2022   Sinus tachycardia    saw cardiology dr lonni shuman 09-14-2020 epic   SVT (supraventricular tachycardia) (HCC) 10/06/2022     Past Surgical History:  Procedure Laterality Date   LAPAROSCOPIC BILATERAL SALPINGECTOMY Bilateral 05/05/2023   Procedure: LAPAROSCOPIC BILATERAL SALPINGECTOMY;  Surgeon: Cleatus Moccasin, MD;  Location: [facility] [place];  Service: Gynecology;  Laterality: Bilateral;   NO PAST SURGERIES     WISDOM TOOTH EXTRACTION  2017   with sedation    Medications  Current Outpatient Medications on File Prior to Visit  Medication Sig Dispense Refill   ipratropium (ATROVENT ) 0.03 % nasal spray Place 2 sprays into both nostrils every 12 (twelve) hours. 30 mL 12   albuterol  (VENTOLIN  HFA) 108 (90 Base) MCG/ACT inhaler Inhale 1-2 puffs into the lungs every 6 (six) hours as needed for wheezing or shortness of breath. 18 g 0   albuterol  (VENTOLIN  HFA) 108 (90 Base) MCG/ACT inhaler Inhale 1-2 puffs into the lungs every 6 (six) hours as needed for wheezing or shortness of breath. 6.7 g 0   cetirizine  (ZYRTEC ) 10 MG tablet Take 10 mg by mouth daily.     doxycycline  (VIBRAMYCIN ) 100 MG capsule Take 1 capsule (100 mg total) by mouth 2  (two) times daily for 7 days. 14 capsule 1   hydrOXYzine  (ATARAX ) 25 MG tablet Take 25 mg by mouth 3 (three) times daily as needed for anxiety.     metoprolol  succinate (TOPROL -XL) 25 MG 24 hr tablet Take 0.5 tablets (12.5 mg total) by mouth daily. 45 tablet 1   valACYclovir  (VALTREX ) 1000 MG tablet Take 1 tablet (1,000 mg total) by mouth 2 (two) times daily. Take 1 tablet daily for suppression. Take 1 tablet twice a day for ten days for outbreaks. 60 tablet 12    Allergies Allergies  Allergen Reactions   Dust Mite Extract Hives   Mixed Ragweed Hives   Venofer  [Iron  Sucrose] Other (See Comments)    Chest pain    Review of Systems: Constitutional:  no unexpected weight changes Eye:  no recent significant change in vision Ear/Nose/Mouth/Throat:  Ears:  no tinnitus or vertigo and no recent change in hearing Nose/Mouth/Throat:  no complaints of nasal congestion, no sore throat Cardiovascular: no chest pain Respiratory:  no cough and no shortness of breath Gastrointestinal:  no abdominal pain, no change in bowel habits GU:  Female: negative for dysuria or pelvic pain Musculoskeletal/Extremities:  no pain of the joints Integumentary (Skin/Breast):  no abnormal skin lesions reported Neurologic:  no headaches Endocrine:  denies fatigue Hematologic/Lymphatic:  No areas of easy bleeding  Exam BP 118/76 (BP Location: Left Arm, Patient Position: Sitting)   Pulse 63   Temp 98 F (36.7 C) (Oral)  Resp 16   Ht 5' 5 (1.651 m)   Wt 198 lb 3.2 oz (89.9 kg)   LMP 01/18/2024 (Exact Date)   SpO2 98%   BMI 32.98 kg/m  General:  well developed, well nourished, in no apparent distress Skin:  no significant moles, warts, or growths Head:  no masses, lesions, or tenderness Eyes:  pupils equal and round, sclera anicteric without injection Ears:  canals without lesions, TMs shiny without retraction, no obvious effusion, no erythema Nose:  nares patent, mucosa normal, and no drainage   Throat/Pharynx:  lips and gingiva without lesion; tongue and uvula midline; non-inflamed pharynx; no exudates or postnasal drainage Neck: neck supple without adenopathy, thyromegaly, or masses Lungs:  clear to auscultation, breath sounds equal bilaterally, no respiratory distress Cardio:  regular rate and rhythm, no bruits, no LE edema Abdomen:  abdomen soft, nontender; bowel sounds normal; no masses or organomegaly Genital: Defer to GYN Musculoskeletal:  symmetrical muscle groups noted without atrophy or deformity Extremities:  no clubbing, cyanosis, or edema, no deformities, no skin discoloration Neuro:  gait normal; deep tendon reflexes normal and symmetric Psych: well oriented with normal range of affect and appropriate judgment/insight  Assessment and Plan  Well adult exam - Plan: CBC, Comprehensive metabolic panel with GFR, Lipid panel, Cervicovaginal ancillary only( [place])  SVT (supraventricular tachycardia) (HCC) - Plan: TSH, T4, free, Magnesium   Screening for HIV without presence of risk factors - Plan: HIV Antibody (routine testing w rflx)   Well 23 y.o. female. Counseled on diet and exercise. Rec'd more strength training.  Advanced directive form provided today.  Ck above as started by GYN.  Other orders as above. Follow up in 1 yr. The patient voiced understanding and agreement to the plan.  Mabel [place], DO 02/03/24 10:13 AM

## 2024-02-03 NOTE — Addendum Note (Signed)
 Addended by: TANDA YORK CROME on: 02/03/2024 04:20 PM   Modules accepted: Orders

## 2024-02-03 NOTE — Patient Instructions (Addendum)
Give [place] 2-3 business days to get the results of your labs back.   Keep the diet clean and stay active.  Please consider adding some weight resistance exercise to your routine. Consider yoga as well.   Please get me a copy of your advanced directive form at your convenience.   I recommend getting the flu shot in mid October. This suggestion would change if the CDC comes out with a different recommendation.   Let us know if you need anything.

## 2024-02-03 NOTE — Progress Notes (Signed)
 ANNUAL EXAM Patient name: Alexis [name] MRN [id number]  Date of birth: 2001-01-27 Chief Complaint:   Annual Exam  History of Present Illness:   Alexis [name] is a 23 y.o.  [phone]  female  being seen today for a routine annual exam.  Current complaints: Having episodes of anxiety - sees psychiatrist and was placed on vistaril , which she takes at night. Cannot take it during day as it makes her sleepy. Did have SVT during last pregnancy - had heart rate in the 200s.  Not sure if she has had other episodes of SVT, but does feel that her heart rate gets really fast at times.  Has a difficult time saying whether her heart rate or an anxiety is a preceding event.  No shortness of breath or chest pain.  Not anticipating pregnancy.  Not currently sexually active.  Patient's last menstrual period was 01/18/2024 (exact date).    Last pap 2024 and was normal.  She did have a abnormal Pap with [facility] in 2023 which was abnormal.  She does not remember the exact result. Last mammogram: n/a     02/03/2024    9:04 AM 02/05/2023   10:26 AM 05/28/2021   11:43 AM  Depression screen PHQ 2/9  Decreased Interest 0 0 0  Down, Depressed, Hopeless 0 0 0  PHQ - 2 Score 0 0 0  Altered sleeping 1 0 1  Tired, decreased energy 1 1 1   Change in appetite 1 0 0  Feeling bad or failure about yourself  0 0 0  Trouble concentrating 1 0 0  Moving slowly or fidgety/restless 0 0 0  Suicidal thoughts 0 0 0  PHQ-9 Score 4 1 2         02/03/2024    9:05 AM 02/05/2023   10:26 AM 05/28/2021   11:44 AM  GAD 7 : Generalized Anxiety Score  Nervous, Anxious, on Edge 1 1 1   Control/stop worrying 1 0 0  Worry too much - different things 1 0 0  Trouble relaxing 1 0 0  Restless 1 0 0  Easily annoyed or irritable 1 0 0  Afraid - awful might happen 0 0 0  Total GAD 7 Score 6 1 1      Review of Systems:   Pertinent items are noted in HPI Denies any headaches, blurred vision, fatigue,  shortness of breath, chest pain, abdominal pain, abnormal vaginal discharge/itching/odor/irritation, problems with periods, bowel movements, urination, or intercourse unless otherwise stated above. Pertinent History Reviewed:  Reviewed past medical,surgical, social and family history.  Reviewed problem list, medications and allergies. Physical Assessment:   Vitals:   02/03/24 0859  BP: 125/81  Pulse: 78  Weight: 198 lb (89.8 kg)  Height: 5' 5 (1.651 m)  Body mass index is 32.95 kg/m.        Physical Examination:   General appearance - well appearing, and in no distress  Mental status - alert, oriented to person, place, and time  Psych:  She has a normal mood and affect  Skin - warm and dry, normal color, no suspicious lesions noted  Chest - effort normal, all lung fields clear to auscultation bilaterally  Heart - normal rate and regular rhythm  Neck:  midline trachea, no thyromegaly or nodules  Breasts - breasts appear normal, no suspicious masses, no skin or nipple changes or axillary nodes  Abdomen - soft, nontender, nondistended, no masses or organomegaly  Pelvic - VULVA: normal appearing vulva with no  masses, tenderness or lesions  VAGINA: normal appearing vagina with normal color and discharge, no lesions  CERVIX: normal appearing cervix without discharge or lesions, no CMT  Thin prep pap is done   Extremities:  No swelling or varicosities noted  Chaperone present for exam  Assessment & Plan:  1. Well woman exam with routine gynecological exam (Primary) Pap done today.  If this is normal, she will return to normal screening.  2. SVT (supraventricular tachycardia) (HCC) Will start patient on metoprolol  to see if controlling the heart rate will help to control anxiety episodes.  Will get labs and refer to cardiology.  She will likely need a Zio patch to see if she is truly having sinus tachycardic episodes versus SVT episodes. - TSH Rfx on Abnormal to Free T4 - CBC - Comp  Met (CMET) - Magnesium  - metoprolol  succinate (TOPROL -XL) 25 MG 24 hr tablet; Take 0.5 tablets (12.5 mg total) by mouth daily.  Dispense: 45 tablet; Refill: 1 - Ambulatory referral to Cardiology  3. Anxiety    Labs/procedures today:  Orders Placed This Encounter  Procedures   TSH Rfx on Abnormal to Free T4   CBC   Comp Met (CMET)   Magnesium    Ambulatory referral to Cardiology    Meds:  Meds ordered this encounter  Medications   metoprolol  succinate (TOPROL -XL) 25 MG 24 hr tablet    Sig: Take 0.5 tablets (12.5 mg total) by mouth daily.    Dispense:  45 tablet    Refill:  1    Follow-up: No follow-ups on file.  Genavive Kubicki J Doyal Saric, DO 02/03/2024 9:31 AM

## 2024-02-04 LAB — HIV ANTIBODY (ROUTINE TESTING W REFLEX): HIV 1&2 Ab, 4th Generation: NONREACTIVE

## 2024-02-08 ENCOUNTER — Encounter: Admitting: Family Medicine

## 2024-02-08 ENCOUNTER — Telehealth: Payer: Self-pay | Admitting: Family Medicine

## 2024-02-08 LAB — CYTOLOGY - PAP: Diagnosis: NEGATIVE

## 2024-02-08 NOTE — Telephone Encounter (Signed)
 Paperwork plaved in [rovider bin for review.

## 2024-02-08 NOTE — Telephone Encounter (Signed)
 Pt dropped off paper to be filled out by pcp. Please call pt when ready to be picked up. Left paper in pcps box.

## 2024-02-09 ENCOUNTER — Ambulatory Visit: Payer: Self-pay | Admitting: Family Medicine

## 2024-02-09 ENCOUNTER — Telehealth: Payer: Self-pay

## 2024-02-09 NOTE — Telephone Encounter (Unsigned)
 Copied from CRM #[id number]. Topic: Medical Record Request - Other >> Feb 09, 2024  9:19 AM Alexis [name] wrote: Reason for CRM: Pt is requesting immunization records for school.

## 2024-02-09 NOTE — Telephone Encounter (Signed)
 Sent pt message letting her know shot records will be at front desk.

## 2024-02-09 NOTE — Telephone Encounter (Signed)
 Called pt lvm letting her know we have her physical form ready, just to make sure she dont need a new TB test if so will need appt schedule for the test. Ask her to call the office back to let us  know.

## 2024-03-17 ENCOUNTER — Ambulatory Visit
Admission: EM | Admit: 2024-03-17 | Discharge: 2024-03-17 | Disposition: A | Discharge status: Home / Self Care | Attending: Family Medicine | Admitting: Family Medicine

## 2024-03-17 DIAGNOSIS — R300 Dysuria: Secondary | ICD-10-CM | POA: Insufficient documentation

## 2024-03-17 DIAGNOSIS — Z113 Encounter for screening for infections with a predominantly sexual mode of transmission: Secondary | ICD-10-CM | POA: Diagnosis present

## 2024-03-17 DIAGNOSIS — N760 Acute vaginitis: Secondary | ICD-10-CM | POA: Insufficient documentation

## 2024-03-17 DIAGNOSIS — N898 Other specified noninflammatory disorders of vagina: Secondary | ICD-10-CM | POA: Diagnosis present

## 2024-03-17 LAB — POCT URINE DIPSTICK
Bilirubin, UA: NEGATIVE
Blood, UA: NEGATIVE
Glucose, UA: NEGATIVE mg/dL
Ketones, POC UA: NEGATIVE mg/dL
Nitrite, UA: NEGATIVE
POC PROTEIN,UA: NEGATIVE
Spec Grav, UA: 1.015 (ref 1.010–1.025)
Urobilinogen, UA: 1 U/dL
pH, UA: 6.5 (ref 5.0–8.0)

## 2024-03-17 LAB — POCT URINE PREGNANCY: Preg Test, Ur: NEGATIVE

## 2024-03-17 MED ORDER — FLUCONAZOLE 150 MG PO TABS: 150.0000 mg | ORAL_TABLET | ORAL | 0 refills | Status: DC

## 2024-03-17 MED ORDER — ACYCLOVIR 400 MG PO TABS: ORAL_TABLET | ORAL | 0 refills | Status: AC

## 2024-03-17 NOTE — Discharge Instructions (Addendum)
 Start fluconazole  for suspected yeast vaginitis. Switch to acyclovir for HSV.

## 2024-03-17 NOTE — ED Provider Notes (Signed)
 [facility]  Note:  This document was prepared using Conservation officer, historic buildings and may include unintentional dictation errors.  MRN: [id number] DOB: 27-Sep-2000  Subjective:   Alexis [name] is a 23 y.o. female presenting for 4-day history of vaginal discharge, dysuria, lower abdominal/pelvic pain that radiates toward her groin.  She is concerned that she may have an HSV infection.  Has genital HSV.  Feels like she may be having an outbreak on the inner labia and would like to be examined.  Has been taking valacyclovir  for this.  No current facility-administered medications for this encounter.  Current Outpatient Medications:    albuterol  (VENTOLIN  HFA) 108 (90 Base) MCG/ACT inhaler, Inhale 1-2 puffs into the lungs every 6 (six) hours as needed for wheezing or shortness of breath., Disp: 6.7 g, Rfl: 0   cetirizine  (ZYRTEC ) 10 MG tablet, Take 10 mg by mouth daily., Disp: , Rfl:    hydrOXYzine  (ATARAX ) 25 MG tablet, Take 25 mg by mouth 3 (three) times daily as needed for anxiety., Disp: , Rfl:    ipratropium (ATROVENT ) 0.03 % nasal spray, Place 2 sprays into both nostrils every 12 (twelve) hours., Disp: 30 mL, Rfl: 12   metoprolol  succinate (TOPROL -XL) 25 MG 24 hr tablet, Take 0.5 tablets (12.5 mg total) by mouth daily., Disp: 45 tablet, Rfl: 1   valACYclovir  (VALTREX ) 1000 MG tablet, Take 1 tablet (1,000 mg total) by mouth 2 (two) times daily. Take 1 tablet daily for suppression. Take 1 tablet twice a day for ten days for outbreaks., Disp: 60 tablet, Rfl: 12   Allergies  Allergen Reactions   Dust Mite Extract Hives   Mixed Ragweed Hives   Venofer  [Iron  Sucrose] Other (See Comments)    Chest pain    Past Medical History:  Diagnosis Date   Anxiety    Chronic hypertension 08/27/2020   Genital HSV    GERD (gastroesophageal reflux disease)    History of postpartum hemorrhage, 2022   History of pre-eclampsia 2022   Sinus tachycardia    saw  cardiology dr lonni shuman 09-14-2020 epic   SVT (supraventricular tachycardia) 10/06/2022     Past Surgical History:  Procedure Laterality Date   LAPAROSCOPIC BILATERAL SALPINGECTOMY Bilateral 05/05/2023   Procedure: LAPAROSCOPIC BILATERAL SALPINGECTOMY;  Surgeon: Cleatus Moccasin, MD;  Location: [facility];  Service: Gynecology;  Laterality: Bilateral;   NO PAST SURGERIES     WISDOM TOOTH EXTRACTION  2017   with sedation    Family History  Problem Relation Age of Onset   Diabetes Mellitus II Mother    Hypertension Mother    Arthritis Father    Hypertension Father    Stomach cancer Paternal Grandmother    Stomach cancer Maternal Aunt    Breast cancer Neg Hx    Colon cancer Neg Hx    Cervical cancer Neg Hx    Heart disease Neg Hx     Social History   Tobacco Use   Smoking status: Never   Smokeless tobacco: Never  Vaping Use   Vaping status: Former  Substance Use Topics   Alcohol use: Yes    Comment: occ   Drug use: Never    ROS   Objective:   Vitals: BP 137/78 (BP Location: Right Arm)   Pulse 99   Temp 99.2 F (37.3 C) (Oral)   Resp 18   LMP 03/07/2024   SpO2 98%   Physical Exam Exam conducted with a chaperone present (CMA Mariela).  Constitutional:      General: She is not in acute distress.    Appearance: Normal appearance. She is well-developed. She is not ill-appearing, toxic-appearing or diaphoretic.  HENT:     Head: Normocephalic and atraumatic.     Nose: Nose normal.     Mouth/Throat:     Mouth: Mucous membranes are moist.     Pharynx: Oropharynx is clear.  Eyes:     General: No scleral icterus.       Right eye: No discharge.        Left eye: No discharge.     Extraocular Movements: Extraocular movements intact.     Conjunctiva/sclera: Conjunctivae normal.  Cardiovascular:     Rate and Rhythm: Normal rate.  Pulmonary:     Effort: Pulmonary effort is normal.  Abdominal:     General: Bowel sounds are normal. There is no  distension.     Palpations: Abdomen is soft. There is no mass.     Tenderness: There is no abdominal tenderness. There is no right CVA tenderness, left CVA tenderness, guarding or rebound.  Genitourinary:    Labia:        Right: No rash, tenderness, lesion or injury.        Left: Rash (area of erythematic tender lesions over left labia minora as outlined) and tenderness present. No lesion or injury.    Skin:    General: Skin is warm and dry.  Neurological:     General: No focal deficit present.     Mental Status: She is alert and oriented to person, place, and time.  Psychiatric:        Mood and Affect: Mood normal.        Behavior: Behavior normal.        Thought Content: Thought content normal.        Judgment: Judgment normal.    Results for orders placed or performed during the hospital encounter of 03/17/24 (from the past 24 hours)  POCT URINE DIPSTICK     Status: Abnormal   Collection Time: 03/17/24  3:56 PM  Result Value Ref Range   Color, UA yellow yellow   Clarity, UA hazy (A) clear   Glucose, UA negative negative mg/dL   Bilirubin, UA negative negative   Ketones, POC UA negative negative mg/dL   Spec Grav, UA 8.984 8.989 - 1.025   Blood, UA negative negative   pH, UA 6.5 5.0 - 8.0   POC PROTEIN,UA negative negative, trace   Urobilinogen, UA 1.0 0.2 or 1.0 E.U./dL   Nitrite, UA Negative Negative   Leukocytes, UA Small (1+) (A) Negative  POCT urine pregnancy     Status: None   Collection Time: 03/17/24  3:56 PM  Result Value Ref Range   Preg Test, Ur Negative Negative    Assessment and Plan :   PDMP not reviewed this encounter.  1. Acute vaginitis   2. Dysuria   3. Vaginal discharge   4. Screen for STD (sexually transmitted disease)    Recommend switching to acyclovir.  Will also treat empirically for acute vaginitis with oral fluconazole .  STI screening, vaginal cytology, urine culture pending.  Counseled patient on potential for adverse effects with  medications prescribed/recommended today, ER and return-to-clinic precautions discussed, patient verbalized understanding.    Christopher Savannah, [place] 03/17/24 [id number]

## 2024-03-17 NOTE — ED Triage Notes (Signed)
 Pt c/o lower abd pain, vaginal d/c and dysuria-sx started 3-4 days ago-states she is taking valtrex  in case she has a herpes outbreak

## 2024-03-18 ENCOUNTER — Ambulatory Visit (HOSPITAL_COMMUNITY): Payer: Self-pay

## 2024-03-18 ENCOUNTER — Ambulatory Visit: Admitting: Radiology

## 2024-03-18 ENCOUNTER — Ambulatory Visit
Admission: EM | Admit: 2024-03-18 | Discharge: 2024-03-18 | Disposition: A | Discharge status: Home / Self Care | Attending: Internal Medicine | Admitting: Internal Medicine

## 2024-03-18 ENCOUNTER — Other Ambulatory Visit: Payer: Self-pay

## 2024-03-18 DIAGNOSIS — R051 Acute cough: Secondary | ICD-10-CM | POA: Diagnosis not present

## 2024-03-18 DIAGNOSIS — R0789 Other chest pain: Secondary | ICD-10-CM

## 2024-03-18 DIAGNOSIS — Z8719 Personal history of other diseases of the digestive system: Secondary | ICD-10-CM | POA: Diagnosis not present

## 2024-03-18 LAB — CERVICOVAGINAL ANCILLARY ONLY
Bacterial Vaginitis (gardnerella): NEGATIVE
Candida Glabrata: NEGATIVE
Candida Vaginitis: NEGATIVE
Chlamydia: NEGATIVE
Comment: NEGATIVE
Comment: NEGATIVE
Comment: NEGATIVE
Comment: NEGATIVE
Comment: NEGATIVE
Comment: NORMAL
Neisseria Gonorrhea: NEGATIVE
Trichomonas: POSITIVE — AB

## 2024-03-18 LAB — HIV ANTIBODY (ROUTINE TESTING W REFLEX): HIV Screen 4th Generation wRfx: NONREACTIVE

## 2024-03-18 LAB — RPR: RPR Ser Ql: NONREACTIVE

## 2024-03-18 LAB — URINE CULTURE: Culture: NO GROWTH

## 2024-03-18 MED ORDER — ALUM & MAG HYDROXIDE-SIMETH 200-200-20 MG/5ML PO SUSP
30.0000 mL | Freq: Once | ORAL | Status: AC
  Administered 2024-03-18: 30 mL via ORAL

## 2024-03-18 MED ORDER — AZITHROMYCIN 250 MG PO TABS
ORAL_TABLET | ORAL | 0 refills | Status: AC
Start: 2024-03-18 — End: ?

## 2024-03-18 MED ORDER — METRONIDAZOLE 500 MG PO TABS: 500.0000 mg | ORAL_TABLET | Freq: Two times a day (BID) | ORAL | 0 refills | Status: AC

## 2024-03-18 MED ORDER — LIDOCAINE VISCOUS HCL 2 % MT SOLN
15.0000 mL | Freq: Once | OROMUCOSAL | Status: AC
  Administered 2024-03-18: 15 mL via OROMUCOSAL

## 2024-03-18 NOTE — ED Notes (Signed)
 Spoke with provider. States to hold off on EKG. Would like to examine patient first.

## 2024-03-18 NOTE — ED Provider Notes (Signed)
 [facility]    CSN: [id number] Arrival date & time: 03/18/24  1605      History   Chief Complaint Chief Complaint  Patient presents with   Chest Pain    HPI Alexis [name] is a 23 y.o. female who presents with R chest pain x 2 dasys. The pain is constant and described as achy and sharp. Pain level 7/10. Denies SOB. She took a Protonix  yesterday with food and no on an empty stomach since she has hx of reflux and did not help. She has had a productive cough with green mucous x 5-6 days. She may cough 3-4 times a day only, and denies cough attacks. Her aunt who lives with her has pneumonia. Pt denies fever or sweats.     Past Medical History:  Diagnosis Date   Anxiety    Chronic hypertension 08/27/2020   Genital HSV    GERD (gastroesophageal reflux disease)    History of postpartum hemorrhage, 2022   History of pre-eclampsia 2022   Sinus tachycardia    saw cardiology dr lonni shuman 09-14-2020 epic   SVT (supraventricular tachycardia) 10/06/2022    Patient Active Problem List   Diagnosis Date Noted   SVT (supraventricular tachycardia) 10/06/2022   Anxiety    Chronic hypertension 08/27/2020   GERD (gastroesophageal reflux disease)     Past Surgical History:  Procedure Laterality Date   LAPAROSCOPIC BILATERAL SALPINGECTOMY Bilateral 05/05/2023   Procedure: LAPAROSCOPIC BILATERAL SALPINGECTOMY;  Surgeon: Cleatus Moccasin, MD;  Location: [facility] [place];  Service: Gynecology;  Laterality: Bilateral;   NO PAST SURGERIES     WISDOM TOOTH EXTRACTION  2017   with sedation    OB History     Gravida  2   Para  2   Term  1   Preterm  1   AB  0   Living  3      SAB  0   IAB  0   Ectopic  0   Multiple  1   Live Births  3            Home Medications    Prior to Admission medications   Medication Sig Start Date End Date Taking? Authorizing Provider  azithromycin  (ZITHROMAX  Z-PAK) 250 MG tablet 2 today, then one a  day x 4 days 03/18/24  Yes Rodriguez-Southworth, Yuko [place], PA-C  acyclovir (ZOVIRAX) 400 MG tablet At the start of an outbreak take 2 tablets twice daily for 5 days. 03/17/24   Christopher Savannah, PA-C  albuterol  (VENTOLIN  HFA) 108 (90 Base) MCG/ACT inhaler Inhale 1-2 puffs into the lungs every 6 (six) hours as needed for wheezing or shortness of breath. 12/05/23   White, Elizabeth A, PA-C  cetirizine  (ZYRTEC ) 10 MG tablet Take 10 mg by mouth daily.    [provider]  fluconazole  (DIFLUCAN ) 150 MG tablet Take 1 tablet (150 mg total) by mouth every 3 (three) days. 03/17/24   Christopher Savannah, PA-C  hydrOXYzine  (ATARAX ) 25 MG tablet Take 25 mg by mouth 3 (three) times daily as needed for anxiety.    [provider]  ipratropium (ATROVENT ) 0.03 % nasal spray Place 2 sprays into both nostrils every 12 (twelve) hours. 12/03/23   White, Shelba SAUNDERS, NP  metroNIDAZOLE  (FLAGYL ) 500 MG tablet Take 1 tablet (500 mg total) by mouth 2 (two) times daily for 7 days. 03/18/24 03/25/24  Vonna Sharlet POUR, MD  valACYclovir  (VALTREX ) 1000 MG tablet Take 1 tablet (1,000 mg total) by mouth  2 (two) times daily. Take 1 tablet daily for suppression. Take 1 tablet twice a day for ten days for outbreaks. 09/15/23   Smith, [place] , CNM    Family History Family History  Problem Relation Age of Onset   Diabetes Mellitus II Mother    Hypertension Mother    Arthritis Father    Hypertension Father    Stomach cancer Paternal Grandmother    Stomach cancer Maternal Aunt    Breast cancer Neg Hx    Colon cancer Neg Hx    Cervical cancer Neg Hx    Heart disease Neg Hx     Social History Social History   Tobacco Use   Smoking status: Never   Smokeless tobacco: Never  Vaping Use   Vaping status: Former  Substance Use Topics   Alcohol use: Yes    Comment: occ   Drug use: Never     Allergies   Dust mite extract, Mixed ragweed, and Venofer  [iron  sucrose]   Review of Systems Review of Systems  As noted in  HPI  Physical Exam Triage Vital Signs ED Triage Vitals  Encounter Vitals Group     BP 03/18/24 1617 (!) 137/98     Girls Systolic BP Percentile --      Girls Diastolic BP Percentile --      Boys Systolic BP Percentile --      Boys Diastolic BP Percentile --      Pulse Rate 03/18/24 1617 76     Resp 03/18/24 1617 18     Temp 03/18/24 1617 98.7 F (37.1 C)     Temp Source 03/18/24 1617 Oral     SpO2 03/18/24 1617 98 %     Weight 03/18/24 1620 190 lb (86.2 kg)     Height 03/18/24 1620 5' 5 (1.651 m)     Head Circumference --      Peak Flow --      Pain Score 03/18/24 1620 7     Pain Loc --      Pain Education --      Exclude from Growth Chart --    No data found.  Updated Vital Signs BP (!) 137/98 (BP Location: Right Arm)   Pulse 76   Temp 98.7 F (37.1 C) (Oral)   Resp 18   Ht 5' 5 (1.651 m)   Wt 190 lb (86.2 kg)   LMP 03/07/2024 (Exact Date)   SpO2 98%   Breastfeeding No   BMI 31.62 kg/m   Visual Acuity Right Eye Distance:   Left Eye Distance:   Bilateral Distance:    Right Eye Near:   Left Eye Near:    Bilateral Near:     Physical Exam Vitals and nursing note reviewed.  Constitutional:      General: She is not in acute distress.    Appearance: She is not ill-appearing, toxic-appearing or diaphoretic.  HENT:     Right Ear: External ear normal.     Left Ear: External ear normal.  Eyes:     General: No scleral icterus.    Conjunctiva/sclera: Conjunctivae normal.  Cardiovascular:     Rate and Rhythm: Normal rate and regular rhythm.     Heart sounds: Normal heart sounds.  Pulmonary:     Effort: Pulmonary effort is normal.     Breath sounds: Normal breath sounds.  Chest:     Chest wall: No tenderness.  Musculoskeletal:        General: Normal range of motion.  Cervical back: Neck supple.  Skin:    General: Skin is warm and dry.  Neurological:     General: No focal deficit present.     Mental Status: She is alert and oriented to person, place,  and time.     Gait: Gait normal.  Psychiatric:        Mood and Affect: Mood normal.        Behavior: Behavior normal.        Thought Content: Thought content normal.        Judgment: Judgment normal.      UC Treatments / Results  Labs (all labs ordered are listed, but only abnormal results are displayed) Labs Reviewed - No data to display  EKG   Radiology DG Chest 2 View Result Date: 03/18/2024 CLINICAL DATA:  R chest pain and productive purulent cough. Exposed to family with pneumonia. EXAM: CHEST - 2 VIEW COMPARISON:  08/24/2023. FINDINGS: Bilateral lung fields are clear. Bilateral costophrenic angles are clear. Normal cardio-mediastinal silhouette. No acute osseous abnormalities. The soft tissues are within normal limits. IMPRESSION: No active cardiopulmonary disease. Electronically Signed   By: Ree Molt M.D.   On: 03/18/2024 16:58    Procedures Procedures (including critical care time)  Medications Ordered in UC Medications  alum & mag hydroxide-simeth (MAALOX/MYLANTA) 200-200-20 MG/5ML suspension 30 mL (30 mLs Oral Given 03/18/24 1707)  lidocaine  (XYLOCAINE ) 2 % viscous mouth solution 15 mL (15 mLs Mouth/Throat Given 03/18/24 1707)    Initial Impression / Assessment and Plan / UC Course  I have reviewed the triage vital signs and the nursing notes. She was given a trial of GI cocktail which helped bring the pain from 7/10 to 5/10 Pertinent  imaging results that were available during my care of the patient were reviewed by me and considered in my medical decision making (see chart for details). Chest xray was negative.  Chest pain more likely due to esophagus so told to take her PPI on empty stomach every day. Since she has been having productive cough, I placed her on Zpack as noted.  Needs to FU with PCP if symptoms persist.    Final Clinical Impressions(s) / UC Diagnoses   Final diagnoses:  Acute cough  Atypical chest pain  History of gastroesophageal reflux  (GERD)     Discharge Instructions      Your chest xray is normal.  Since the GI cocktail helped, more likely the pain is coming from your esophagus.  For heart burn medicine like Prilosec, Nexium or Prevacid to work best, it is better to take it on empty stomach first thing in the morning.   DG Chest 2 View Result Date: 03/18/2024 CLINICAL DATA:  R chest pain and productive purulent cough. Exposed to family with pneumonia. EXAM: CHEST - 2 VIEW COMPARISON:  08/24/2023. FINDINGS: Bilateral lung fields are clear. Bilateral costophrenic angles are clear. Normal cardio-mediastinal silhouette. No acute osseous abnormalities. The soft tissues are within normal limits. IMPRESSION: No active cardiopulmonary disease. Electronically Signed   By: Ree Molt M.D.   On: 03/18/2024 16:58      ED Prescriptions     Medication Sig Dispense Auth. Provider   azithromycin  (ZITHROMAX  Z-PAK) 250 MG tablet 2 today, then one a day x 4 days 6 tablet Rodriguez-Southworth, Kyra, PA-C      PDMP not reviewed this encounter.   Lindi Kyra, PA-C 03/18/24 1724

## 2024-03-18 NOTE — Discharge Instructions (Addendum)
 Your chest xray is normal.  Since the GI cocktail helped, more likely the pain is coming from your esophagus.  For heart burn medicine like Prilosec, Nexium or Prevacid to work best, it is better to take it on empty stomach first thing in the morning.   DG Chest 2 View Result Date: 03/18/2024 CLINICAL DATA:  R chest pain and productive purulent cough. Exposed to family with pneumonia. EXAM: CHEST - 2 VIEW COMPARISON:  08/24/2023. FINDINGS: Bilateral lung fields are clear. Bilateral costophrenic angles are clear. Normal cardio-mediastinal silhouette. No acute osseous abnormalities. The soft tissues are within normal limits. IMPRESSION: No active cardiopulmonary disease. Electronically Signed   By: Ree Molt M.D.   On: 03/18/2024 16:58

## 2024-03-18 NOTE — ED Triage Notes (Addendum)
 Pt presents with a chief complaint of right-sided chest pain x 2 days. States the pain is constant. Describes as achy and sharp. Currently rates overall chest pain a 7/10. Does endorse SOB with sitting. Hx of acid reflux. Does take Protonix  as needed. Took one yesterday. Symptoms did not improve.

## 2024-03-20 LAB — MISC LABCORP TEST (SEND OUT): Labcorp test code: 180076

## 2024-03-21 ENCOUNTER — Ambulatory Visit

## 2024-04-05 ENCOUNTER — Ambulatory Visit
Admission: EM | Admit: 2024-04-05 | Discharge: 2024-04-05 | Disposition: A | Discharge status: Home / Self Care | Attending: Family Medicine | Admitting: Family Medicine

## 2024-04-05 DIAGNOSIS — Z113 Encounter for screening for infections with a predominantly sexual mode of transmission: Secondary | ICD-10-CM | POA: Diagnosis present

## 2024-04-05 DIAGNOSIS — A599 Trichomoniasis, unspecified: Secondary | ICD-10-CM | POA: Diagnosis not present

## 2024-04-05 MED ORDER — FLUCONAZOLE 150 MG PO TABS: 150.0000 mg | ORAL_TABLET | ORAL | 0 refills | Status: DC

## 2024-04-05 MED ORDER — METRONIDAZOLE 500 MG PO TABS: 500.0000 mg | ORAL_TABLET | Freq: Two times a day (BID) | ORAL | 0 refills | Status: DC

## 2024-04-05 NOTE — ED Triage Notes (Signed)
 Pt states she was treated for trichomonas 10/2-she completed rx but is still having same vaginal d/c-NAD-steady gait

## 2024-04-05 NOTE — ED Provider Notes (Signed)
 [facility]  Note:  This document was prepared using Conservation officer, historic buildings and may include unintentional dictation errors.  MRN: [id number] DOB: Jun 26, 2000  Subjective:   Alexis [name] is a 23 y.o. female presenting for persistent thick yellow vaginal discharge. No change in her symptoms following treatment with Flagyl  for trichomonas.  She also took fluconazole . Would like to be treated again. No urinary symptoms.   No current facility-administered medications for this encounter.  Current Outpatient Medications:    acyclovir (ZOVIRAX) 400 MG tablet, At the start of an outbreak take 2 tablets twice daily for 5 days., Disp: 60 tablet, Rfl: 0   albuterol  (VENTOLIN  HFA) 108 (90 Base) MCG/ACT inhaler, Inhale 1-2 puffs into the lungs every 6 (six) hours as needed for wheezing or shortness of breath., Disp: 6.7 g, Rfl: 0   azithromycin  (ZITHROMAX  Z-PAK) 250 MG tablet, 2 today, then one a day x 4 days, Disp: 6 tablet, Rfl: 0   cetirizine  (ZYRTEC ) 10 MG tablet, Take 10 mg by mouth daily., Disp: , Rfl:    fluconazole  (DIFLUCAN ) 150 MG tablet, Take 1 tablet (150 mg total) by mouth every 3 (three) days., Disp: 3 tablet, Rfl: 0   hydrOXYzine  (ATARAX ) 25 MG tablet, Take 25 mg by mouth 3 (three) times daily as needed for anxiety., Disp: , Rfl:    ipratropium (ATROVENT ) 0.03 % nasal spray, Place 2 sprays into both nostrils every 12 (twelve) hours., Disp: 30 mL, Rfl: 12   valACYclovir  (VALTREX ) 1000 MG tablet, Take 1 tablet (1,000 mg total) by mouth 2 (two) times daily. Take 1 tablet daily for suppression. Take 1 tablet twice a day for ten days for outbreaks., Disp: 60 tablet, Rfl: 12   Allergies  Allergen Reactions   Dust Mite Extract Hives   Mixed Ragweed Hives   Venofer  [Iron  Sucrose] Other (See Comments)    Chest pain    Past Medical History:  Diagnosis Date   Anxiety    Chronic hypertension 08/27/2020   Genital HSV    GERD (gastroesophageal  reflux disease)    History of postpartum hemorrhage, 2022   History of pre-eclampsia 2022   Sinus tachycardia    saw cardiology dr lonni shuman 09-14-2020 epic   SVT (supraventricular tachycardia) 10/06/2022     Past Surgical History:  Procedure Laterality Date   LAPAROSCOPIC BILATERAL SALPINGECTOMY Bilateral 05/05/2023   Procedure: LAPAROSCOPIC BILATERAL SALPINGECTOMY;  Surgeon: Cleatus Moccasin, MD;  Location: [facility];  Service: Gynecology;  Laterality: Bilateral;   NO PAST SURGERIES     WISDOM TOOTH EXTRACTION  2017   with sedation    Family History  Problem Relation Age of Onset   Diabetes Mellitus II Mother    Hypertension Mother    Arthritis Father    Hypertension Father    Stomach cancer Paternal Grandmother    Stomach cancer Maternal Aunt    Breast cancer Neg Hx    Colon cancer Neg Hx    Cervical cancer Neg Hx    Heart disease Neg Hx     Social History   Tobacco Use   Smoking status: Never   Smokeless tobacco: Never  Vaping Use   Vaping status: Former  Substance Use Topics   Alcohol use: Yes    Comment: occ   Drug use: Never    ROS   Objective:   Vitals: BP 127/84 (BP Location: Right Arm)   Pulse 79   Temp 98.9 F (37.2 C) (Oral)  Resp 16   LMP 03/31/2024   SpO2 98%   Physical Exam Constitutional:      General: She is not in acute distress.    Appearance: Normal appearance. She is well-developed. She is not ill-appearing, toxic-appearing or diaphoretic.  HENT:     Head: Normocephalic and atraumatic.     Nose: Nose normal.     Mouth/Throat:     Mouth: Mucous membranes are moist.  Eyes:     General: No scleral icterus.       Right eye: No discharge.        Left eye: No discharge.     Extraocular Movements: Extraocular movements intact.  Cardiovascular:     Rate and Rhythm: Normal rate.  Pulmonary:     Effort: Pulmonary effort is normal.  Skin:    General: Skin is warm and dry.  Neurological:     General: No  focal deficit present.     Mental Status: She is alert and oriented to person, place, and time.  Psychiatric:        Mood and Affect: Mood normal.        Behavior: Behavior normal.     Assessment and Plan :   PDMP not reviewed this encounter.  1. Trichomonas infection   2. Screen for STD (sexually transmitted disease)    Discussed using tinidazole for suspected refractory trichomonas infection.  However, the medication is not covered by her insurance and is too costly. Vaginal cytology pending. Would like to try one more round of Flagyl . Use fluconazole  to prevent yeast vaginitis. Counseled patient on potential for adverse effects with medications prescribed/recommended today, ER and return-to-clinic precautions discussed, patient verbalized understanding.    Christopher Savannah, [place] 04/05/24 [phone]

## 2024-04-06 LAB — CERVICOVAGINAL ANCILLARY ONLY
Bacterial Vaginitis (gardnerella): NEGATIVE
Candida Glabrata: NEGATIVE
Candida Vaginitis: NEGATIVE
Chlamydia: NEGATIVE
Comment: NEGATIVE
Comment: NEGATIVE
Comment: NEGATIVE
Comment: NEGATIVE
Comment: NEGATIVE
Comment: NORMAL
Neisseria Gonorrhea: NEGATIVE
Trichomonas: NEGATIVE

## 2024-04-08 LAB — MISC LABCORP TEST (SEND OUT): Labcorp test code: 180076

## 2024-05-06 ENCOUNTER — Ambulatory Visit
Admission: EM | Admit: 2024-05-06 | Discharge: 2024-05-06 | Disposition: A | Discharge status: Home / Self Care | Attending: Urgent Care | Admitting: Urgent Care

## 2024-05-06 ENCOUNTER — Ambulatory Visit: Payer: Self-pay | Admitting: Urgent Care

## 2024-05-06 ENCOUNTER — Ambulatory Visit (INDEPENDENT_AMBULATORY_CARE_PROVIDER_SITE_OTHER)

## 2024-05-06 DIAGNOSIS — M542 Cervicalgia: Secondary | ICD-10-CM | POA: Diagnosis not present

## 2024-05-06 DIAGNOSIS — M545 Low back pain, unspecified: Secondary | ICD-10-CM

## 2024-05-06 DIAGNOSIS — M25532 Pain in left wrist: Secondary | ICD-10-CM

## 2024-05-06 DIAGNOSIS — S161XXA Strain of muscle, fascia and tendon at neck level, initial encounter: Secondary | ICD-10-CM

## 2024-05-06 DIAGNOSIS — S60212A Contusion of left wrist, initial encounter: Secondary | ICD-10-CM

## 2024-05-06 MED ORDER — CYCLOBENZAPRINE HCL 5 MG PO TABS: 5.0000 mg | ORAL_TABLET | Freq: Every evening | ORAL | 0 refills | Status: AC | PRN

## 2024-05-06 MED ORDER — NAPROXEN 500 MG PO TABS: 500.0000 mg | ORAL_TABLET | Freq: Two times a day (BID) | ORAL | 0 refills | Status: AC
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
## 2024-05-11 ENCOUNTER — Telehealth: Payer: Self-pay

## 2024-05-11 NOTE — Telephone Encounter (Signed)
 Patient called wanting to schedule a nurse visit for STD testing. Informed patient that we do not have any openings until 12/5. Patient opted to go to urgent care for testing.

## 2024-05-26 ENCOUNTER — Ambulatory Visit: Admission: EM | Admit: 2024-05-26 | Discharge: 2024-05-26 | Discharge status: Against Medical Advice | Payer: Self-pay

## 2024-05-26 NOTE — ED Triage Notes (Signed)
 Pt states SOB,cough,congestion,runny nose and chills for the past 6 days.  States she was taking ibuprofen  at home because she had a sore throat at first which has resolved.

## 2024-06-01 ENCOUNTER — Ambulatory Visit
Admission: EM | Admit: 2024-06-01 | Discharge: 2024-06-01 | Disposition: A | Discharge status: Home / Self Care | Attending: Family Medicine | Admitting: Family Medicine

## 2024-06-01 DIAGNOSIS — J209 Acute bronchitis, unspecified: Secondary | ICD-10-CM

## 2024-06-01 MED ORDER — BENZONATATE 200 MG PO CAPS: 200.0000 mg | ORAL_CAPSULE | Freq: Three times a day (TID) | ORAL | 0 refills | Status: DC | PRN

## 2024-06-01 MED ORDER — AZITHROMYCIN 250 MG PO TABS: 250.0000 mg | ORAL_TABLET | Freq: Every day | ORAL | 0 refills | Status: DC

## 2024-06-01 MED ORDER — ALBUTEROL SULFATE HFA 108 (90 BASE) MCG/ACT IN AERS: 1.0000 | INHALATION_SPRAY | Freq: Four times a day (QID) | RESPIRATORY_TRACT | 0 refills | Status: DC | PRN

## 2024-06-01 NOTE — ED Provider Notes (Signed)
 [facility]    CSN: [id number] Arrival date & time: 06/01/24  9164      History   Chief Complaint Chief Complaint  Patient presents with   Shortness of Breath    HPI Alexis [name] is a 23 y.o. female  presents for evaluation of URI symptoms for 2 weeks. Patient reports associated symptoms of cough, congestion, wheezing and chills. Denies N/V/D, fevers, ear pain, body aches, shortness of breath. Patient does not have a hx of asthma. Patient is not an active smoker.   Reports sick contacts via daughter.  Pt has taken NyQuil and allergy medicine OTC for symptoms. Pt has no other concerns at this time.    Shortness of Breath Associated symptoms: cough and wheezing     Past Medical History:  Diagnosis Date   Anxiety    Chronic hypertension 08/27/2020   Genital HSV    GERD (gastroesophageal reflux disease)    History of postpartum hemorrhage, 2022   History of pre-eclampsia 2022   Sinus tachycardia    saw cardiology dr lonni shuman 09-14-2020 epic   SVT (supraventricular tachycardia) 10/06/2022    Patient Active Problem List   Diagnosis Date Noted   SVT (supraventricular tachycardia) 10/06/2022   Anxiety    Chronic hypertension 08/27/2020   GERD (gastroesophageal reflux disease)     Past Surgical History:  Procedure Laterality Date   LAPAROSCOPIC BILATERAL SALPINGECTOMY Bilateral 05/05/2023   Procedure: LAPAROSCOPIC BILATERAL SALPINGECTOMY;  Surgeon: Cleatus Moccasin, MD;  Location: [facility];  Service: Gynecology;  Laterality: Bilateral;   NO PAST SURGERIES     WISDOM TOOTH EXTRACTION  2017   with sedation    OB History     Gravida  2   Para  2   Term  1   Preterm  1   AB  0   Living  3      SAB  0   IAB  0   Ectopic  0   Multiple  1   Live Births  3            Home Medications    Prior to Admission medications  Medication Sig Start Date End Date Taking? Authorizing Provider  albuterol   (VENTOLIN  HFA) 108 (90 Base) MCG/ACT inhaler Inhale 1-2 puffs into the lungs every 6 (six) hours as needed. 06/01/24  Yes Jeffey Janssen, Jodi R, NP  azithromycin  (ZITHROMAX ) 250 MG tablet Take 1 tablet (250 mg total) by mouth daily. Take first 2 tablets together, then 1 every day until finished. 06/01/24  Yes Shaheem Pichon, Jodi R, NP  benzonatate  (TESSALON ) 200 MG capsule Take 1 capsule (200 mg total) by mouth 3 (three) times daily as needed. 06/01/24  Yes Wiley Flicker, Jodi R, NP  acyclovir  (ZOVIRAX ) 400 MG tablet At the start of an outbreak take 2 tablets twice daily for 5 days. 03/17/24   Christopher Savannah, PA-C  cetirizine  (ZYRTEC ) 10 MG tablet Take 10 mg by mouth daily.    [provider]  cyclobenzaprine  (FLEXERIL ) 5 MG tablet Take 1 tablet (5 mg total) by mouth at bedtime as needed. 05/06/24   Christopher Savannah, PA-C  fluconazole  (DIFLUCAN ) 150 MG tablet Take 1 tablet (150 mg total) by mouth every 3 (three) days. 04/05/24   Christopher Savannah, PA-C  hydrOXYzine  (ATARAX ) 25 MG tablet Take 25 mg by mouth 3 (three) times daily as needed for anxiety.    [provider]  ipratropium (ATROVENT ) 0.03 % nasal spray Place 2 sprays into both nostrils  every 12 (twelve) hours. 12/03/23   White, Shelba SAUNDERS, NP  metroNIDAZOLE  (FLAGYL ) 500 MG tablet Take 1 tablet (500 mg total) by mouth 2 (two) times daily. 04/05/24   Christopher Savannah, PA-C  naproxen  (NAPROSYN ) 500 MG tablet Take 1 tablet (500 mg total) by mouth 2 (two) times daily with a meal. 05/06/24   Christopher Savannah, PA-C  valACYclovir  (VALTREX ) 1000 MG tablet Take 1 tablet (1,000 mg total) by mouth 2 (two) times daily. Take 1 tablet daily for suppression. Take 1 tablet twice a day for ten days for outbreaks. 09/15/23   Smith, [place] , CNM    Family History Family History  Problem Relation Age of Onset   Diabetes Mellitus II Mother    Hypertension Mother    Arthritis Father    Hypertension Father    Stomach cancer Paternal Grandmother    Stomach cancer Maternal Aunt    Breast  cancer Neg Hx    Colon cancer Neg Hx    Cervical cancer Neg Hx    Heart disease Neg Hx     Social History Social History[1]   Allergies   Dust mite extract, Mixed ragweed, and Venofer  [iron  sucrose]   Review of Systems Review of Systems  Constitutional:  Positive for chills.  HENT:  Positive for congestion.   Respiratory:  Positive for cough and wheezing.      Physical Exam Triage Vital Signs ED Triage Vitals  Encounter Vitals Group     BP 06/01/24 0957 (!) 145/96     Girls Systolic BP Percentile --      Girls Diastolic BP Percentile --      Boys Systolic BP Percentile --      Boys Diastolic BP Percentile --      Pulse Rate 06/01/24 0957 91     Resp 06/01/24 0957 16     Temp 06/01/24 0957 98 F (36.7 C)     Temp Source 06/01/24 0957 Oral     SpO2 06/01/24 0957 98 %     Weight --      Height --      Head Circumference --      Peak Flow --      Pain Score 06/01/24 0956 0     Pain Loc --      Pain Education --      Exclude from Growth Chart --    No data found.  Updated Vital Signs BP (!) 145/96 (BP Location: Left Arm)   Pulse 91   Temp 98 F (36.7 C) (Oral)   Resp 16   LMP 05/23/2024 (Exact Date)   SpO2 98%   Breastfeeding No   Visual Acuity Right Eye Distance:   Left Eye Distance:   Bilateral Distance:    Right Eye Near:   Left Eye Near:    Bilateral Near:     Physical Exam Vitals and nursing note reviewed.  Constitutional:      General: She is not in acute distress.    Appearance: She is well-developed. She is not ill-appearing.  HENT:     Head: Normocephalic and atraumatic.     Right Ear: Tympanic membrane and ear canal normal.     Left Ear: Tympanic membrane and ear canal normal.     Nose: Congestion present.     Mouth/Throat:     Mouth: Mucous membranes are moist.     Pharynx: Oropharynx is clear. Uvula midline. No posterior oropharyngeal erythema.     Tonsils: No tonsillar exudate or  tonsillar abscesses.  Eyes:      Conjunctiva/sclera: Conjunctivae normal.     Pupils: Pupils are equal, round, and reactive to light.  Cardiovascular:     Rate and Rhythm: Normal rate and regular rhythm.     Heart sounds: Normal heart sounds.  Pulmonary:     Effort: Pulmonary effort is normal.     Breath sounds: Normal breath sounds. No wheezing, rhonchi or rales.  Musculoskeletal:     Cervical back: Normal range of motion and neck supple.  Lymphadenopathy:     Cervical: No cervical adenopathy.  Skin:    General: Skin is warm and dry.  Neurological:     General: No focal deficit present.     Mental Status: She is alert and oriented to person, place, and time.  Psychiatric:        Mood and Affect: Mood normal.        Behavior: Behavior normal.      UC Treatments / Results  Labs (all labs ordered are listed, but only abnormal results are displayed) Labs Reviewed - No data to display  EKG   Radiology No results found.  Procedures Procedures (including critical care time)  Medications Ordered in UC Medications - No data to display  Initial Impression / Assessment and Plan / UC Course  I have reviewed the triage vital signs and the nursing notes.  Pertinent labs & imaging results that were available during my care of the patient were reviewed by me and considered in my medical decision making (see chart for details).     Reviewed exam and symptoms with patient.  No red flags.  Given length of symptoms start azithromycin .  Albuterol  and Tessalon  as needed.  Encourage rest fluids and PCP follow-up in 2 to 3 days for recheck.  ER precautions reviewed.  Final Clinical Impressions(s) / UC Diagnoses   Final diagnoses:  Acute bronchitis, unspecified organism     Discharge Instructions      Start albuterol  inhaler as needed for wheezing or shortness of breath.  Take Tessalon  as needed for your cough.  Zithromax  antibiotic as prescribed.  Lots of rest and fluids.  Follow-up with your PCP in 2 to 3 days  for recheck.  Please go to the emergency room for any worsening symptoms.  Hope you feel better soon exclamation    ED Prescriptions     Medication Sig Dispense Auth. Provider   albuterol  (VENTOLIN  HFA) 108 (90 Base) MCG/ACT inhaler Inhale 1-2 puffs into the lungs every 6 (six) hours as needed. 1 each Loreda Myla SAUNDERS, NP   azithromycin  (ZITHROMAX ) 250 MG tablet Take 1 tablet (250 mg total) by mouth daily. Take first 2 tablets together, then 1 every day until finished. 6 tablet Niguel Moure, Jodi R, NP   benzonatate  (TESSALON ) 200 MG capsule Take 1 capsule (200 mg total) by mouth 3 (three) times daily as needed. 20 capsule Aneisha Skyles, Jodi R, NP      PDMP not reviewed this encounter.    [1]  Social History Tobacco Use   Smoking status: Never   Smokeless tobacco: Never  Vaping Use   Vaping status: Former  Substance Use Topics   Alcohol use: Yes    Comment: occ   Drug use: Never     Loreda Myla SAUNDERS, NP 06/01/24 1025

## 2024-06-01 NOTE — ED Triage Notes (Signed)
 Pt states SOB,cough,congestion,runny nose and chills for the past 2 weeks. States she has been taking cetrizine and Nyquil at home.

## 2024-06-01 NOTE — Discharge Instructions (Signed)
 Start albuterol  inhaler as needed for wheezing or shortness of breath.  Take Tessalon  as needed for your cough.  Zithromax  antibiotic as prescribed.  Lots of rest and fluids.  Follow-up with your PCP in 2 to 3 days for recheck.  Please go to the emergency room for any worsening symptoms.  Hope you feel better soon exclamation

## 2024-06-15 ENCOUNTER — Ambulatory Visit

## 2024-06-22 ENCOUNTER — Ambulatory Visit
Admission: EM | Admit: 2024-06-22 | Discharge: 2024-06-22 | Disposition: A | Discharge status: Home / Self Care | Attending: Student | Admitting: Student

## 2024-06-22 VITALS — BP 132/90 | HR 72 | Temp 97.5°F | Resp 17

## 2024-06-22 DIAGNOSIS — Z113 Encounter for screening for infections with a predominantly sexual mode of transmission: Secondary | ICD-10-CM | POA: Insufficient documentation

## 2024-06-22 DIAGNOSIS — N760 Acute vaginitis: Secondary | ICD-10-CM | POA: Diagnosis present

## 2024-06-22 LAB — POCT URINE PREGNANCY: Preg Test, Ur: NEGATIVE

## 2024-06-22 MED ORDER — FLUCONAZOLE 150 MG PO TABS: 150.0000 mg | ORAL_TABLET | Freq: Every day | ORAL | 0 refills | Status: AC

## 2024-06-22 NOTE — ED Provider Notes (Signed)
 [facility]    CSN: [id number] Arrival date & time: 06/22/24  0916      History   Chief Complaint Chief Complaint  Patient presents with   SEXUALLY TRANSMITTED DISEASE    HPI Alexis [name] is a 24 y.o. female presenting w vaginitis.   Pt presents for STD testing with blood work. She wants to include mycoplasma  genitalium.  Pt c/o white vaginal discharge, vaginal irritation and pruritis and odor for 3-4 days. Occasional mild crampy lower abd pain. New partner.    HPI  Past Medical History:  Diagnosis Date   Anxiety    Chronic hypertension 08/27/2020   Genital HSV    GERD (gastroesophageal reflux disease)    History of postpartum hemorrhage, 2022   History of pre-eclampsia 2022   Sinus tachycardia    saw cardiology dr lonni shuman 09-14-2020 epic   SVT (supraventricular tachycardia) 10/06/2022    Patient Active Problem List   Diagnosis Date Noted   SVT (supraventricular tachycardia) 10/06/2022   Anxiety    Chronic hypertension 08/27/2020   GERD (gastroesophageal reflux disease)     Past Surgical History:  Procedure Laterality Date   LAPAROSCOPIC BILATERAL SALPINGECTOMY Bilateral 05/05/2023   Procedure: LAPAROSCOPIC BILATERAL SALPINGECTOMY;  Surgeon: Cleatus Moccasin, MD;  Location: [facility];  Service: Gynecology;  Laterality: Bilateral;   NO PAST SURGERIES     WISDOM TOOTH EXTRACTION  2017   with sedation    OB History     Gravida  2   Para  2   Term  1   Preterm  1   AB  0   Living  3      SAB  0   IAB  0   Ectopic  0   Multiple  1   Live Births  3            Home Medications    Prior to Admission medications  Medication Sig Start Date End Date Taking? Authorizing Provider  fluconazole  (DIFLUCAN ) 150 MG tablet Take 1 tablet (150 mg total) by mouth daily. -For your yeast infection, start the Diflucan  (fluconazole )- Take one pill today (day 1). If you're still having symptoms in 3 days,  take the second pill. 06/22/24  Yes Gregory Dowe E, PA-C  acyclovir  (ZOVIRAX ) 400 MG tablet At the start of an outbreak take 2 tablets twice daily for 5 days. 03/17/24   Christopher Savannah, PA-C  albuterol  (VENTOLIN  HFA) 108 (90 Base) MCG/ACT inhaler Inhale 1-2 puffs into the lungs every 6 (six) hours as needed. 06/01/24   Mayer, Jodi R, NP  cetirizine  (ZYRTEC ) 10 MG tablet Take 10 mg by mouth daily.    [provider]  cyclobenzaprine  (FLEXERIL ) 5 MG tablet Take 1 tablet (5 mg total) by mouth at bedtime as needed. 05/06/24   Christopher Savannah, PA-C  hydrOXYzine  (ATARAX ) 25 MG tablet Take 25 mg by mouth 3 (three) times daily as needed for anxiety.    [provider]  ipratropium (ATROVENT ) 0.03 % nasal spray Place 2 sprays into both nostrils every 12 (twelve) hours. 12/03/23   Teresa Shelba SAUNDERS, NP  naproxen  (NAPROSYN ) 500 MG tablet Take 1 tablet (500 mg total) by mouth 2 (two) times daily with a meal. 05/06/24   Christopher Savannah, PA-C  valACYclovir  (VALTREX ) 1000 MG tablet Take 1 tablet (1,000 mg total) by mouth 2 (two) times daily. Take 1 tablet daily for suppression. Take 1 tablet twice a day for ten days for  outbreaks. 09/15/23   Smith, [place] , CNM    Family History Family History  Problem Relation Age of Onset   Diabetes Mellitus II Mother    Hypertension Mother    Arthritis Father    Hypertension Father    Stomach cancer Paternal Grandmother    Stomach cancer Maternal Aunt    Breast cancer Neg Hx    Colon cancer Neg Hx    Cervical cancer Neg Hx    Heart disease Neg Hx     Social History Social History[1]   Allergies   Dust mite extract, Mixed ragweed, and Venofer  [iron  sucrose]   Review of Systems Review of Systems  Constitutional:  Negative for chills and fever.  HENT:  Negative for sore throat.   Eyes:  Negative for pain and redness.  Respiratory:  Negative for shortness of breath.   Cardiovascular:  Negative for chest pain.  Gastrointestinal:  Positive for abdominal  pain. Negative for diarrhea, nausea and vomiting.  Genitourinary:  Positive for vaginal discharge. Negative for decreased urine volume, difficulty urinating, dysuria, flank pain, frequency, genital sores, hematuria and urgency.  Musculoskeletal:  Negative for back pain.  Skin:  Negative for rash.     Physical Exam Triage Vital Signs ED Triage Vitals [06/22/24 0936]  Encounter Vitals Group     BP (!) 132/90     Girls Systolic BP Percentile      Girls Diastolic BP Percentile      Boys Systolic BP Percentile      Boys Diastolic BP Percentile      Pulse Rate 72     Resp 17     Temp (!) 97.5 F (36.4 C)     Temp Source Oral     SpO2 98 %     Weight      Height      Head Circumference      Peak Flow      Pain Score 0     Pain Loc      Pain Education      Exclude from Growth Chart    No data found.  Updated Vital Signs BP (!) 132/90 (BP Location: Right Arm)   Pulse 72   Temp (!) 97.5 F (36.4 C) (Oral)   Resp 17   LMP 06/15/2024 (Exact Date)   SpO2 98%   Breastfeeding No   Visual Acuity Right Eye Distance:   Left Eye Distance:   Bilateral Distance:    Right Eye Near:   Left Eye Near:    Bilateral Near:     Physical Exam Vitals reviewed.  Constitutional:      General: She is not in acute distress.    Appearance: Normal appearance. She is not ill-appearing.  HENT:     Head: Normocephalic and atraumatic.     Mouth/Throat:     Mouth: Mucous membranes are moist.     Comments: Moist mucous membranes Eyes:     Extraocular Movements: Extraocular movements intact.     Pupils: Pupils are equal, round, and reactive to light.  Cardiovascular:     Rate and Rhythm: Normal rate and regular rhythm.     Heart sounds: Normal heart sounds.  Pulmonary:     Effort: Pulmonary effort is normal.     Breath sounds: Normal breath sounds. No wheezing, rhonchi or rales.  Abdominal:     General: Bowel sounds are normal. There is no distension.     Palpations: Abdomen is soft.  There is no mass.  Tenderness: There is no abdominal tenderness. There is no right CVA tenderness, left CVA tenderness, guarding or rebound.     Comments: No pain to palpation   Skin:    General: Skin is warm.     Capillary Refill: Capillary refill takes less than 2 seconds.     Comments: Good skin turgor  Neurological:     General: No focal deficit present.     Mental Status: She is alert and oriented to person, place, and time.  Psychiatric:        Mood and Affect: Mood normal.        Behavior: Behavior normal.      UC Treatments / Results  Labs (all labs ordered are listed, but only abnormal results are displayed) Labs Reviewed  POCT URINE PREGNANCY - Normal  MISC [facility] TEST (SEND OUT)  HIV ANTIBODY (ROUTINE TESTING W REFLEX)  SYPHILIS: RPR W/REFLEX TO RPR TITER AND TREPONEMAL ANTIBODIES, TRADITIONAL SCREENING AND DIAGNOSIS ALGORITHM  CERVICOVAGINAL ANCILLARY ONLY    EKG   Radiology No results found.  Procedures Procedures (including critical care time)  Medications Ordered in UC Medications - No data to display  Initial Impression / Assessment and Plan / UC Course  I have reviewed the triage vital signs and the nursing notes.  Pertinent labs & imaging results that were available during my care of the patient were reviewed by me and considered in my medical decision making (see chart for details).     Patient is a pleasant 24 y.o. female presenting with vaginitis. The patient is afebrile and nontachycardic.  Antipyretic has not been administered today.  Will send self-swab for G/C, trich, yeast, BV, mycoplasma, HIV, syphilis testing. Safe sex precautions.   Diflucan  sent today.   Final Clinical Impressions(s) / UC Diagnoses   Final diagnoses:  Routine screening for STI (sexually transmitted infection)  Vaginitis and vulvovaginitis     Discharge Instructions      -For your yeast infection, start the Diflucan  (fluconazole )- Take one pill today  (day 1). If you're still having symptoms in 3 days, take the second pill.  - We are testing for gonorrhea, chlamydia, trichomonas, BV, mycoplasma, and yeast. We also did a blood test for HIV and syphilis.  -We will call with any positive results in about 3 business days and can send treatment if necessary. -We will not call with negative lab results. -Lab results will automatically go to your MyChart.      ED Prescriptions     Medication Sig Dispense Auth. Provider   fluconazole  (DIFLUCAN ) 150 MG tablet Take 1 tablet (150 mg total) by mouth daily. -For your yeast infection, start the Diflucan  (fluconazole )- Take one pill today (day 1). If you're still having symptoms in 3 days, take the second pill. 2 tablet Harlem Bula E, PA-C      PDMP not reviewed this encounter.     [1]  Social History Tobacco Use   Smoking status: Never   Smokeless tobacco: Never  Vaping Use   Vaping status: Former  Substance Use Topics   Alcohol use: Yes    Comment: occ   Drug use: Never     Arlyss Leita BRAVO, PA-C 06/22/24 9047  "

## 2024-06-22 NOTE — ED Triage Notes (Addendum)
 Pt presents for STD testing with blood work. She wants to include mycoplasma  genitalium.  Pt c/o white vaginal discharge, vaginal irritation, and odor for 3-4 days

## 2024-06-22 NOTE — Discharge Instructions (Signed)
-  For your yeast infection, start the Diflucan  (fluconazole )- Take one pill today (day 1). If you're still having symptoms in 3 days, take the second pill.  - We are testing for gonorrhea, chlamydia, trichomonas, BV, mycoplasma, and yeast. We also did a blood test for HIV and syphilis.  -We will call with any positive results in about 3 business days and can send treatment if necessary. -We will not call with negative lab results. -Lab results will automatically go to your MyChart.

## 2024-06-23 ENCOUNTER — Ambulatory Visit: Payer: Self-pay

## 2024-06-23 LAB — HIV ANTIBODY (ROUTINE TESTING W REFLEX): HIV Screen 4th Generation wRfx: NONREACTIVE

## 2024-06-23 LAB — SYPHILIS: RPR W/REFLEX TO RPR TITER AND TREPONEMAL ANTIBODIES, TRADITIONAL SCREENING AND DIAGNOSIS ALGORITHM: RPR Ser Ql: NONREACTIVE

## 2024-06-23 NOTE — Telephone Encounter (Signed)
 FYI Only or Action Required?: FYI only for provider: appointment scheduled on 06/28/24.  Patient was last seen in primary care on 02/03/2024 by Frann Mabel Mt, DO.  Called Nurse Triage reporting [place].  Symptoms began about a month ago.  Interventions attempted: Nothing.  Symptoms are: gradually worsening.  Triage Disposition: See PCP Within 2 Weeks  Patient/caregiver understands and will follow disposition?: Yes  Reason for Triage: bump or growth on elbow past month , painful, and its tender. Pain isnt getting worse or improving    Reason for Disposition  [1] Skin growth or mole AND [2] bleeds or itches or is painful  Answer Assessment - Initial Assessment Questions Patient states she first noticed this bump about 1.5 months ago. It was not painful at the time, but it has progressed to be more painful to touch recently. Office visit advised.   1. APPEARANCE of LESION: What does it look like?      Small, perfectly round raised bump  2. SIZE: How big is it? (e.g., inches, cm; or compare to size of pinhead, tip of pen, eraser, coin, pea, grape, ping pong ball)      Small  3. COLOR: What color is it? Is there more than one color?     Same color as skin  4. SHAPE: What shape is it? (e.g., round, irregular)     Round  5. RAISED: Does it stick up above the skin or is it flat? (e.g., raised or elevated)     Raised  6. TENDER: Does it hurt when you touch it?  (Scale 1-10; or mild, moderate, severe)     Yes, states that it is tender to touch and she feels like the pain then radiates up the arm  7. LOCATION: Where is it located?      Right elbow  8. ONSET: When did it first appear?      About 1.5 months ago  9. NUMBER: Is there just one? or Are there others?     1  10. CAUSE: What do you think it is?       Not sure  11. OTHER SYMPTOMS: Do you have any other symptoms? (e.g., fever)       No other symptoms  12. PREGNANCY: Is there any  chance you are pregnant? When was your last menstrual period?       No  Protocols used: Skin Lesion - Moles or Growths-A-AH

## 2024-06-24 ENCOUNTER — Ambulatory Visit (HOSPITAL_COMMUNITY): Payer: Self-pay

## 2024-06-24 LAB — CERVICOVAGINAL ANCILLARY ONLY
Bacterial Vaginitis (gardnerella): NEGATIVE
Candida Glabrata: NEGATIVE
Candida Vaginitis: NEGATIVE
Comment: NEGATIVE
Comment: NEGATIVE
Comment: NEGATIVE
Comment: NEGATIVE
Comment: NEGATIVE
Comment: NORMAL
Trichomonas: NEGATIVE

## 2024-06-25 ENCOUNTER — Ambulatory Visit
Admission: EM | Admit: 2024-06-25 | Discharge: 2024-06-25 | Disposition: A | Discharge status: Home / Self Care | Attending: Family Medicine | Admitting: Family Medicine

## 2024-06-25 DIAGNOSIS — J4521 Mild intermittent asthma with (acute) exacerbation: Secondary | ICD-10-CM | POA: Diagnosis not present

## 2024-06-25 DIAGNOSIS — R062 Wheezing: Secondary | ICD-10-CM

## 2024-06-25 DIAGNOSIS — J069 Acute upper respiratory infection, unspecified: Secondary | ICD-10-CM | POA: Diagnosis not present

## 2024-06-25 LAB — POC COVID19/FLU A&B COMBO
Covid Antigen, POC: NEGATIVE
Influenza A Antigen, POC: NEGATIVE
Influenza B Antigen, POC: NEGATIVE

## 2024-06-25 MED ORDER — IPRATROPIUM-ALBUTEROL 0.5-2.5 (3) MG/3ML IN SOLN
3.0000 mL | Freq: Once | RESPIRATORY_TRACT | Status: AC
  Administered 2024-06-25: 3 mL via RESPIRATORY_TRACT

## 2024-06-25 MED ORDER — ALBUTEROL SULFATE HFA 108 (90 BASE) MCG/ACT IN AERS: 1.0000 | INHALATION_SPRAY | Freq: Four times a day (QID) | RESPIRATORY_TRACT | 0 refills | Status: AC | PRN

## 2024-06-25 MED ORDER — BENZONATATE 200 MG PO CAPS: 200.0000 mg | ORAL_CAPSULE | Freq: Three times a day (TID) | ORAL | 0 refills | Status: AC | PRN

## 2024-06-25 MED ORDER — PREDNISONE 20 MG PO TABS: 40.0000 mg | ORAL_TABLET | Freq: Every day | ORAL | 0 refills | Status: AC

## 2024-06-25 NOTE — ED Triage Notes (Signed)
 Pt present with c/o a productive mucus, nasal congestion, chills and body aches x 3 days. Pt states she has been wheezing. Pt denies fever.  Home interventions: Vitamin C, Zyrtec  and Nyquil

## 2024-06-25 NOTE — Discharge Instructions (Signed)
 You tested negative for COVID and flu.  Please start Tessalon  3 times a day as needed for your cough.  I sent in a butyryl inhaler to use as needed for your wheezing and also start prednisone  daily for 5 days.  Lots of rest and fluids.  Please follow-up with your PCP in 2 to 3 days for recheck.  Please go to the emergency room for any worsening symptoms.  Hope you feel better soon!

## 2024-06-25 NOTE — ED Provider Notes (Signed)
 " [facility]    CSN: [id number] Arrival date & time: 06/25/24  9178      History   Chief Complaint Chief Complaint  Patient presents with   Chest Congestion   Cough    HPI Alexis [name] is a 24 y.o. female  presents for evaluation of URI symptoms for 3 days. Patient reports associated symptoms of cough, congestion, chills, body aches, wheezing and shortness of breath. Denies N/V/D, sore throat, documented fevers. Patient does want have a hx of asthma but states she has had to have inhalers in the past with respiratory illnesses. Patient is not an active smoker.   Reports son has similar symptoms.  Pt has taken vit c, zyrtec , nyquil OTC for symptoms. Pt has no other concerns at this time.    Cough Associated symptoms: chills, myalgias and wheezing     Past Medical History:  Diagnosis Date   Anxiety    Chronic hypertension 08/27/2020   Genital HSV    GERD (gastroesophageal reflux disease)    History of postpartum hemorrhage, 2022   History of pre-eclampsia 2022   Sinus tachycardia    saw cardiology dr lonni shuman 09-14-2020 epic   SVT (supraventricular tachycardia) 10/06/2022    Patient Active Problem List   Diagnosis Date Noted   SVT (supraventricular tachycardia) 10/06/2022   Anxiety    Chronic hypertension 08/27/2020   GERD (gastroesophageal reflux disease)     Past Surgical History:  Procedure Laterality Date   LAPAROSCOPIC BILATERAL SALPINGECTOMY Bilateral 05/05/2023   Procedure: LAPAROSCOPIC BILATERAL SALPINGECTOMY;  Surgeon: Cleatus Moccasin, MD;  Location: [facility] [place];  Service: Gynecology;  Laterality: Bilateral;   NO PAST SURGERIES     WISDOM TOOTH EXTRACTION  2017   with sedation    OB History     Gravida  2   Para  2   Term  1   Preterm  1   AB  0   Living  3      SAB  0   IAB  0   Ectopic  0   Multiple  1   Live Births  3            Home Medications    Prior to Admission  medications  Medication Sig Start Date End Date Taking? Authorizing Provider  albuterol  (VENTOLIN  HFA) 108 (90 Base) MCG/ACT inhaler Inhale 1-2 puffs into the lungs every 6 (six) hours as needed. 06/25/24  Yes Elvyn Krohn, Jodi R, NP  benzonatate  (TESSALON ) 200 MG capsule Take 1 capsule (200 mg total) by mouth 3 (three) times daily as needed. 06/25/24  Yes Quanita Barona, Jodi R, NP  predniSONE  (DELTASONE ) 20 MG tablet Take 2 tablets (40 mg total) by mouth daily with breakfast for 5 days. 06/25/24 06/30/24 Yes Ozelle Brubacher, Jodi R, NP  acyclovir  (ZOVIRAX ) 400 MG tablet At the start of an outbreak take 2 tablets twice daily for 5 days. 03/17/24   Christopher Savannah, PA-C  cetirizine  (ZYRTEC ) 10 MG tablet Take 10 mg by mouth daily.    [provider]  cyclobenzaprine  (FLEXERIL ) 5 MG tablet Take 1 tablet (5 mg total) by mouth at bedtime as needed. 05/06/24   Christopher Savannah, PA-C  fluconazole  (DIFLUCAN ) 150 MG tablet Take 1 tablet (150 mg total) by mouth daily. -For your yeast infection, start the Diflucan  (fluconazole )- Take one pill today (day 1). If you're still having symptoms in 3 days, take the second pill. 06/22/24   Graham, Laura E, PA-C  hydrOXYzine  (ATARAX )  25 MG tablet Take 25 mg by mouth 3 (three) times daily as needed for anxiety.    [provider]  ipratropium (ATROVENT ) 0.03 % nasal spray Place 2 sprays into both nostrils every 12 (twelve) hours. 12/03/23   Teresa Shelba SAUNDERS, NP  naproxen  (NAPROSYN ) 500 MG tablet Take 1 tablet (500 mg total) by mouth 2 (two) times daily with a meal. 05/06/24   Christopher Savannah, PA-C  valACYclovir  (VALTREX ) 1000 MG tablet Take 1 tablet (1,000 mg total) by mouth 2 (two) times daily. Take 1 tablet daily for suppression. Take 1 tablet twice a day for ten days for outbreaks. 09/15/23   Smith, [place] , CNM    Family History Family History  Problem Relation Age of Onset   Diabetes Mellitus II Mother    Hypertension Mother    Arthritis Father    Hypertension Father    Stomach cancer  Paternal Grandmother    Stomach cancer Maternal Aunt    Breast cancer Neg Hx    Colon cancer Neg Hx    Cervical cancer Neg Hx    Heart disease Neg Hx     Social History Social History[1]   Allergies   Dust mite extract, Mixed ragweed, and Venofer  [iron  sucrose]   Review of Systems Review of Systems  Constitutional:  Positive for chills.  HENT:  Positive for congestion.   Respiratory:  Positive for cough and wheezing.   Musculoskeletal:  Positive for myalgias.     Physical Exam Triage Vital Signs ED Triage Vitals  Encounter Vitals Group     BP 06/25/24 0832 111/72     Girls Systolic BP Percentile --      Girls Diastolic BP Percentile --      Boys Systolic BP Percentile --      Boys Diastolic BP Percentile --      Pulse Rate 06/25/24 0832 98     Resp 06/25/24 0832 18     Temp 06/25/24 0832 99.3 F (37.4 C)     Temp Source 06/25/24 0832 Oral     SpO2 06/25/24 0832 96 %     Weight --      Height --      Head Circumference --      Peak Flow --      Pain Score 06/25/24 0831 0     Pain Loc --      Pain Education --      Exclude from Growth Chart --    No data found.  Updated Vital Signs BP 111/72   Pulse 98   Temp 99.3 F (37.4 C) (Oral)   Resp 18   LMP 06/15/2024 (Exact Date)   SpO2 96%   Visual Acuity Right Eye Distance:   Left Eye Distance:   Bilateral Distance:    Right Eye Near:   Left Eye Near:    Bilateral Near:     Physical Exam Vitals and nursing note reviewed.  Constitutional:      General: She is not in acute distress.    Appearance: She is well-developed. She is not ill-appearing.  HENT:     Head: Normocephalic and atraumatic.     Right Ear: Tympanic membrane and ear canal normal.     Left Ear: Tympanic membrane and ear canal normal.     Nose: Congestion present.     Mouth/Throat:     Mouth: Mucous membranes are moist.     Pharynx: Oropharynx is clear. Uvula midline. No oropharyngeal exudate or posterior oropharyngeal  erythema.      Tonsils: No tonsillar exudate or tonsillar abscesses.  Eyes:     Conjunctiva/sclera: Conjunctivae normal.     Pupils: Pupils are equal, round, and reactive to light.  Cardiovascular:     Rate and Rhythm: Normal rate and regular rhythm.     Heart sounds: Normal heart sounds.  Pulmonary:     Effort: Pulmonary effort is normal.     Breath sounds: Wheezing present.  Musculoskeletal:     Cervical back: Normal range of motion and neck supple.  Lymphadenopathy:     Cervical: No cervical adenopathy.  Skin:    General: Skin is warm and dry.  Neurological:     General: No focal deficit present.     Mental Status: She is alert and oriented to person, place, and time.  Psychiatric:        Mood and Affect: Mood normal.        Behavior: Behavior normal.      UC Treatments / Results  Labs (all labs ordered are listed, but only abnormal results are displayed) Labs Reviewed  POC COVID19/FLU A&B COMBO    EKG   Radiology No results found.  Procedures Procedures (including critical care time)  Medications Ordered in UC Medications  ipratropium-albuterol  (DUONEB) 0.5-2.5 (3) MG/3ML nebulizer solution 3 mL (3 mLs Nebulization Given 06/25/24 0907)    Initial Impression / Assessment and Plan / UC Course  I have reviewed the triage vital signs and the nursing notes.  Pertinent labs & imaging results that were available during my care of the patient were reviewed by me and considered in my medical decision making (see chart for details).     Reviewed exam and symptoms with patient.  Negative COVID and flu testing.  Discussed viral illness and symptomatic treatment.  Tessalon  3 times a day as needed for cough.  Will do albuterol  inhaler and prednisone  for wheezing.  Encourage rest fluids and PCP follow-up 2 to 3 days for recheck.  ER precautions reviewed Final Clinical Impressions(s) / UC Diagnoses   Final diagnoses:  Wheezing  Viral upper respiratory illness  Mild intermittent  reactive airway disease with acute exacerbation     Discharge Instructions      You tested negative for COVID and flu.  Please start Tessalon  3 times a day as needed for your cough.  I sent in a butyryl inhaler to use as needed for your wheezing and also start prednisone  daily for 5 days.  Lots of rest and fluids.  Please follow-up with your PCP in 2 to 3 days for recheck.  Please go to the emergency room for any worsening symptoms.  Hope you feel better soon!     ED Prescriptions     Medication Sig Dispense Auth. Provider   benzonatate  (TESSALON ) 200 MG capsule Take 1 capsule (200 mg total) by mouth 3 (three) times daily as needed. 20 capsule Khris Jansson, Jodi R, NP   albuterol  (VENTOLIN  HFA) 108 (90 Base) MCG/ACT inhaler Inhale 1-2 puffs into the lungs every 6 (six) hours as needed. 1 each Valicia Rief, Jodi R, NP   predniSONE  (DELTASONE ) 20 MG tablet Take 2 tablets (40 mg total) by mouth daily with breakfast for 5 days. 10 tablet Kayle Passarelli, Jodi R, NP      PDMP not reviewed this encounter.     [1]  Social History Tobacco Use   Smoking status: Never   Smokeless tobacco: Never  Vaping Use   Vaping status: Former  Substance Use Topics  Alcohol use: Yes    Comment: occ   Drug use: Never     Loreda Myla SAUNDERS, NP 06/25/24 [phone]  "

## 2024-06-28 ENCOUNTER — Ambulatory Visit: Admitting: Family Medicine

## 2024-07-05 ENCOUNTER — Ambulatory Visit: Admitting: Family Medicine
